# Patient Record
Sex: Male | Born: 1945
Health system: Southern US, Community
[De-identification: ages and names within clinical notes are randomized; demographics above are authoritative.]

## PROBLEM LIST (undated history)

## (undated) DIAGNOSIS — E785 Hyperlipidemia, unspecified: Secondary | ICD-10-CM

## (undated) DIAGNOSIS — M109 Gout, unspecified: Secondary | ICD-10-CM

## (undated) DIAGNOSIS — Z720 Tobacco use: Secondary | ICD-10-CM

## (undated) DIAGNOSIS — I10 Essential (primary) hypertension: Secondary | ICD-10-CM

## (undated) DIAGNOSIS — I4821 Permanent atrial fibrillation: Secondary | ICD-10-CM

## (undated) DIAGNOSIS — I5022 Chronic systolic (congestive) heart failure: Secondary | ICD-10-CM

## (undated) DIAGNOSIS — D649 Anemia, unspecified: Secondary | ICD-10-CM

## (undated) DIAGNOSIS — J449 Chronic obstructive pulmonary disease, unspecified: Secondary | ICD-10-CM

## (undated) DIAGNOSIS — J189 Pneumonia, unspecified organism: Secondary | ICD-10-CM

## (undated) DIAGNOSIS — E039 Hypothyroidism, unspecified: Secondary | ICD-10-CM

## (undated) DIAGNOSIS — J90 Pleural effusion, not elsewhere classified: Secondary | ICD-10-CM

## (undated) DIAGNOSIS — I447 Left bundle-branch block, unspecified: Secondary | ICD-10-CM

## (undated) HISTORY — DX: Pneumonia, unspecified organism: J18.9

## (undated) HISTORY — DX: Pleural effusion, not elsewhere classified: J90

## (undated) HISTORY — PX: SHOULDER SURGERY: SHX246

## (undated) HISTORY — DX: Hyperlipidemia, unspecified: E78.5

## (undated) HISTORY — PX: HEMORRHOID SURGERY: SHX153

## (undated) HISTORY — DX: Chronic systolic (congestive) heart failure: I50.22

## (undated) HISTORY — DX: Anemia, unspecified: D64.9

## (undated) HISTORY — DX: Essential (primary) hypertension: I10

## (undated) HISTORY — DX: Tobacco use: Z72.0

## (undated) HISTORY — DX: Permanent atrial fibrillation: I48.21

## (undated) HISTORY — DX: Left bundle-branch block, unspecified: I44.7

## (undated) HISTORY — DX: Chronic obstructive pulmonary disease, unspecified: J44.9

## (undated) HISTORY — DX: Hypothyroidism, unspecified: E03.9

## (undated) HISTORY — PX: TONSILLECTOMY: SUR1361

## (undated) HISTORY — DX: Gout, unspecified: M10.9

---

## 2008-04-26 ENCOUNTER — Emergency Department: Payer: Self-pay | Admitting: Emergency Medicine

## 2014-05-20 DIAGNOSIS — J189 Pneumonia, unspecified organism: Secondary | ICD-10-CM

## 2014-05-20 HISTORY — DX: Pneumonia, unspecified organism: J18.9

## 2014-06-29 ENCOUNTER — Inpatient Hospital Stay: Payer: Self-pay | Admitting: Internal Medicine

## 2014-06-29 ENCOUNTER — Other Ambulatory Visit: Payer: Self-pay | Admitting: Physician Assistant

## 2014-06-29 DIAGNOSIS — J189 Pneumonia, unspecified organism: Secondary | ICD-10-CM | POA: Diagnosis not present

## 2014-06-29 DIAGNOSIS — R739 Hyperglycemia, unspecified: Secondary | ICD-10-CM | POA: Diagnosis not present

## 2014-06-29 DIAGNOSIS — I4891 Unspecified atrial fibrillation: Secondary | ICD-10-CM | POA: Diagnosis not present

## 2014-06-29 DIAGNOSIS — I447 Left bundle-branch block, unspecified: Secondary | ICD-10-CM | POA: Diagnosis not present

## 2014-06-29 DIAGNOSIS — I340 Nonrheumatic mitral (valve) insufficiency: Secondary | ICD-10-CM | POA: Diagnosis not present

## 2014-06-30 DIAGNOSIS — I4891 Unspecified atrial fibrillation: Secondary | ICD-10-CM | POA: Diagnosis not present

## 2014-07-01 DIAGNOSIS — I4891 Unspecified atrial fibrillation: Secondary | ICD-10-CM | POA: Diagnosis not present

## 2014-07-02 DIAGNOSIS — I4891 Unspecified atrial fibrillation: Secondary | ICD-10-CM | POA: Diagnosis not present

## 2014-07-03 DIAGNOSIS — I4891 Unspecified atrial fibrillation: Secondary | ICD-10-CM | POA: Diagnosis not present

## 2014-07-04 ENCOUNTER — Telehealth: Payer: Self-pay

## 2014-07-04 DIAGNOSIS — I4891 Unspecified atrial fibrillation: Secondary | ICD-10-CM | POA: Diagnosis not present

## 2014-07-04 DIAGNOSIS — I5022 Chronic systolic (congestive) heart failure: Secondary | ICD-10-CM

## 2014-07-04 NOTE — Telephone Encounter (Signed)
Patient contacted regarding discharge from [facility] on 07/04/14. Spoke w/ pt's son.  Patient understands to follow up with Dr. Kirke Corin on 07/11/14 at 3:30 at [facility]. Patient understands discharge instructions? yes Patient understands medications and regiment? yes Patient understands to bring all medications to this visit? yes  Pt sched to come in for BMET on 2/18, per Eula Listen, PA.  Sondra Barges, PA-C  Marilynne Halsted, RN           Hey,   Can you schedule him for bmet on 2/18? He is being discharged from hospital today.   Dx: chronic systolic CHF

## 2014-07-07 ENCOUNTER — Other Ambulatory Visit (INDEPENDENT_AMBULATORY_CARE_PROVIDER_SITE_OTHER): Payer: Medicare HMO | Admitting: *Deleted

## 2014-07-07 DIAGNOSIS — I5022 Chronic systolic (congestive) heart failure: Secondary | ICD-10-CM | POA: Diagnosis not present

## 2014-07-08 LAB — BASIC METABOLIC PANEL
BUN / CREAT RATIO: 13 (ref 10–22)
BUN: 14 mg/dL (ref 8–27)
CO2: 35 mmol/L — ABNORMAL HIGH (ref 18–29)
CREATININE: 1.1 mg/dL (ref 0.76–1.27)
Calcium: 8.9 mg/dL (ref 8.6–10.2)
Chloride: 85 mmol/L — ABNORMAL LOW (ref 97–108)
GFR calc Af Amer: 79 mL/min/{1.73_m2} (ref >59)
GFR, EST NON AFRICAN AMERICAN: 68 mL/min/{1.73_m2} (ref >59)
Glucose: 101 mg/dL — ABNORMAL HIGH (ref 65–99)
POTASSIUM: 3 mmol/L — AB (ref 3.5–5.2)
Sodium: 137 mmol/L (ref 134–144)

## 2014-07-11 ENCOUNTER — Encounter: Payer: Self-pay | Admitting: Cardiovascular Disease

## 2014-07-11 ENCOUNTER — Encounter: Payer: Medicare HMO | Admitting: Cardiovascular Disease

## 2014-07-11 ENCOUNTER — Ambulatory Visit (INDEPENDENT_AMBULATORY_CARE_PROVIDER_SITE_OTHER): Payer: Medicare HMO | Admitting: Cardiovascular Disease

## 2014-07-11 ENCOUNTER — Telehealth: Payer: Self-pay | Admitting: Physician Assistant

## 2014-07-11 ENCOUNTER — Other Ambulatory Visit: Payer: Self-pay | Admitting: Physician Assistant

## 2014-07-11 ENCOUNTER — Telehealth: Payer: Self-pay | Admitting: *Deleted

## 2014-07-11 VITALS — BP 100/62 | HR 61 | Ht 70.0 in | Wt 158.0 lb

## 2014-07-11 DIAGNOSIS — I10 Essential (primary) hypertension: Secondary | ICD-10-CM

## 2014-07-11 DIAGNOSIS — I4891 Unspecified atrial fibrillation: Secondary | ICD-10-CM

## 2014-07-11 DIAGNOSIS — I5022 Chronic systolic (congestive) heart failure: Secondary | ICD-10-CM

## 2014-07-11 DIAGNOSIS — J441 Chronic obstructive pulmonary disease with (acute) exacerbation: Secondary | ICD-10-CM

## 2014-07-11 DIAGNOSIS — I420 Dilated cardiomyopathy: Secondary | ICD-10-CM | POA: Insufficient documentation

## 2014-07-11 MED ORDER — POTASSIUM CHLORIDE CRYS ER 20 MEQ PO TBCR: 20.0000 meq | EXTENDED_RELEASE_TABLET | Freq: Two times a day (BID) | ORAL | Status: DC

## 2014-07-11 MED ORDER — APIXABAN 5 MG PO TABS: 5.0000 mg | ORAL_TABLET | Freq: Two times a day (BID) | ORAL | Status: DC

## 2014-07-11 NOTE — Assessment & Plan Note (Signed)
Reports he is close to his baseline. Still on oxygen

## 2014-07-11 NOTE — Assessment & Plan Note (Signed)
Blood pressure is well controlled on today's visit. No changes made to the medications. 

## 2014-07-11 NOTE — Assessment & Plan Note (Signed)
We have encouraged him to bring in his medication list or call our office for accurate detailing in the computer. He feels that he is on his eliquis 5 mg twice a day. We will confirm this. Rate well controlled. He is interested in cardioversion. We'll meet again in 2 weeks for EKG to discuss pharmacologic cardioversion

## 2014-07-11 NOTE — Assessment & Plan Note (Signed)
Moderately depressed ejection fraction. Unclear if this is from arrhythmia. Will plan on ischemic workup in follow-up

## 2014-07-11 NOTE — Telephone Encounter (Signed)
-----   Message from Sondra Barges, PA-C sent at 07/08/2014 11:00 AM EST ----- Please call the patient. Patient needs to start KCl 20 mEq bid (he was discharged on Lasix 40 mg bid). Please have him load with 40 mEq x 1 today then start the above dosing

## 2014-07-11 NOTE — Telephone Encounter (Signed)
Contacted by patient stating that pharmacy refused to give him KCl. Contacted the patient's pharmacy, apparently pharmacist had a question regarding why he is taking KCl while already on spironolactone. I explained patient's K was 3.0 despite on spironolactone and authorized the pharmacy to give patient his Rx  Ramond Dial PA Pager: [phone]

## 2014-07-11 NOTE — Telephone Encounter (Signed)
Reviewed results with patient.  Patient verbalized understanding  

## 2014-07-11 NOTE — Patient Instructions (Signed)
You are doing well. No medication changes were made.  Please bring in your medications for review  Please call us if you have new issues that need to be addressed before your next appt.  Your physician wants you to follow-up in: 2 weeks with gollan and  for an ekg

## 2014-07-11 NOTE — Progress Notes (Signed)
Patient ID: Devin Ramsey, male    DOB: May 16, 1946, 69 y.o.   MRN: [id number]  HPI Comments: Devin Ramsey is a 69 year old gentleman who presented to the hospital for retained 2016 with bronchitis symptoms, EKG with new atrial fibrillation, chest x-ray showing pneumonia who was kept in the hospital for 5 days, COPD exacerbation. Cardiology was consult for management of his atrial fibrillation. He presents today to discuss establish care in the [place] office  In follow-up today, he reports that he feels much better, cough has dramatically improved. He was discharged on anticoagulation, eliquis. He did not bring his medication list with him. He has occasional cough but feels almost back to his baseline. He uses chronic oxygen at home Denies any tachycardia. During his hospital course he was treated with aggressive antibiotics  Lab work from the hospital showed sodium 132, potassium 5.1, hemoglobin A1c 5.3, negative cardiac enzymes, Total cholesterol 113, LDL 56, hematocrit 49, creatinine 1.02, magnesium 2.1  Chest x-ray with interstitial pulmonary edema, small bilateral pleural effusions  Echocardiogram reviewed with him from 06/29/2014 showing ejection fraction 35-40%, septal motion consistent with bundle branch block, normal right ventricular systolic pressure, normal left atrial size  EKG done on today's visit shows atrial fibrillation with left bundle branch block   Allergies  Allergen Reactions  . No Known Allergies     Outpatient Encounter Prescriptions as of 07/11/2014  Medication Sig  . CARTIA XT 120 MG 24 hr capsule Take 120 mg by mouth daily.   . furosemide (LASIX) 40 MG tablet Take 40 mg by mouth 2 (two) times daily.   Marland Kitchen lisinopril (PRINIVIL,ZESTRIL) 2.5 MG tablet Take 2.5 mg by mouth daily.   . metoprolol succinate (TOPROL-XL) 25 MG 24 hr tablet Take 25 mg by mouth daily.   Marland Kitchen Neomycin-Bacitracin-Polymyxin (HCA TRIPLE ANTIBIOTIC OINTMENT EX) as needed.   . NON FORMULARY  Equate Nicotine patch (OTC)  . OXYGEN 2 liters daily.  Marland Kitchen SPIRIVA HANDIHALER 18 MCG inhalation capsule   . spironolactone (ALDACTONE) 25 MG tablet Take 25 mg by mouth 2 (two) times daily.   Marland Kitchen apixaban (ELIQUIS) 5 MG TABS tablet Take 1 tablet (5 mg total) by mouth 2 (two) times daily.  . [DISCONTINUED] Vitamins A & D (VITAMIN A & D) 10000-400 UNITS CAPS     Past Medical History  Diagnosis Date  . Pneumonia 2016  . COPD (chronic obstructive pulmonary disease)   . A-fib   . Tobacco abuse   . Acute systolic CHF (congestive heart failure)     EF 35% to 40%  . Bilateral pleural effusion     Past Surgical History  Procedure Laterality Date  . Hemorrhoid surgery    . Shoulder surgery      right shoulder     Social History  reports that he quit smoking about 2 weeks ago. His smoking use included Cigarettes. He quit after 55 years of use. He does not have any smokeless tobacco history on file. He reports that he does not drink alcohol or use illicit drugs.  Family History He was adopted. Family history is unknown by patient.  Review of Systems  Constitutional: Negative.   Respiratory: Positive for cough and shortness of breath.   Cardiovascular: Negative.   Gastrointestinal: Negative.   Musculoskeletal: Negative.   Skin: Negative.   Neurological: Negative.   Hematological: Negative.   Psychiatric/Behavioral: Negative.   All other systems reviewed and are negative.   BP 100/62 mmHg  Pulse 61  Ht 5\' 10"  (  1.778 m)  Wt 158 lb (71.668 kg)  BMI 22.67 kg/m2  Physical Exam  Constitutional: He is oriented to person, place, and time. He appears well-developed and well-nourished.  HENT:  Head: Normocephalic.  Nose: Nose normal.  Mouth/Throat: Oropharynx is clear and moist.  Eyes: Conjunctivae are normal. Pupils are equal, round, and reactive to light.  Neck: Normal range of motion. Neck supple. No JVD present.  Cardiovascular: Normal rate, regular rhythm, S1 normal, S2 normal,  normal heart sounds and intact distal pulses.  Exam reveals no gallop and no friction rub.   No murmur heard. Pulmonary/Chest: Effort normal and breath sounds normal. No respiratory distress. He has no wheezes. He has no rales. He exhibits no tenderness.  Abdominal: Soft. Bowel sounds are normal. He exhibits no distension. There is no tenderness.  Musculoskeletal: Normal range of motion. He exhibits no edema or tenderness.  Lymphadenopathy:    He has no cervical adenopathy.  Neurological: He is alert and oriented to person, place, and time. Coordination normal.  Skin: Skin is warm and dry. No rash noted. No erythema.  Psychiatric: He has a normal mood and affect. His behavior is normal. Judgment and thought content normal.      Assessment and Plan   Nursing note and vitals reviewed.

## 2014-07-11 NOTE — Assessment & Plan Note (Signed)
Depressed ejection fraction in the hospital. Possibly from arrhythmia. Unable to exclude ischemia. We'll need to pursue ischemia workup in the near future. He is relatively asymptomatic at this time

## 2014-07-12 ENCOUNTER — Other Ambulatory Visit: Payer: Self-pay

## 2014-07-12 MED ORDER — ALBUTEROL SULFATE HFA 108 (90 BASE) MCG/ACT IN AERS: 2.0000 | INHALATION_SPRAY | Freq: Four times a day (QID) | RESPIRATORY_TRACT | Status: DC | PRN

## 2014-07-12 MED ORDER — PREDNISONE 10 MG PO TABS: 10.0000 mg | ORAL_TABLET | Freq: Every day | ORAL | Status: DC

## 2014-07-12 MED ORDER — LEVOFLOXACIN 500 MG PO TABS: 500.0000 mg | ORAL_TABLET | Freq: Every day | ORAL | Status: DC

## 2014-07-12 NOTE — Telephone Encounter (Signed)
See previous phone note.  

## 2014-07-12 NOTE — Telephone Encounter (Signed)
Yes, I know there is an interaction there. His K+ was still 3.0 even with being on spiro which is why we needed to add some KCl. We can recheck bmet thurs.

## 2014-07-12 NOTE — Telephone Encounter (Signed)
Attempted to call patient to schedule lab  No answer no vm

## 2014-07-13 ENCOUNTER — Telehealth: Payer: Self-pay | Admitting: Physician Assistant

## 2014-07-13 DIAGNOSIS — I4819 Other persistent atrial fibrillation: Secondary | ICD-10-CM

## 2014-07-13 NOTE — Telephone Encounter (Signed)
Spoke with patient. He will hold potassium tonight and in the morning until he gets his bmet on 2/25. He will come by the office around 9-930 for bmet.

## 2014-07-14 ENCOUNTER — Other Ambulatory Visit (INDEPENDENT_AMBULATORY_CARE_PROVIDER_SITE_OTHER): Payer: Medicare HMO | Admitting: *Deleted

## 2014-07-14 DIAGNOSIS — I5022 Chronic systolic (congestive) heart failure: Secondary | ICD-10-CM

## 2014-07-14 DIAGNOSIS — I4819 Other persistent atrial fibrillation: Secondary | ICD-10-CM

## 2014-07-14 NOTE — Telephone Encounter (Signed)
Patient cam e in for labs today  He said his "gout is acting up"  In the past he used Indocin  He wants to know if he can still take it and if so what dose?

## 2014-07-14 NOTE — Telephone Encounter (Signed)
Indocin is an NSAID which can increase the risk of bleeding now that he is on anticoagulation. Would advise that he see his PCP for alternative therapies. He could potentially take a short course of prednisone for this if his PCP sees fit.

## 2014-07-14 NOTE — Telephone Encounter (Signed)
Informed patient of Devin Dunns response  Patient verbalized understanding  

## 2014-07-15 ENCOUNTER — Telehealth: Payer: Self-pay | Admitting: *Deleted

## 2014-07-15 LAB — BASIC METABOLIC PANEL
BUN/Creatinine Ratio: 11 (ref 10–22)
BUN: 12 mg/dL (ref 8–27)
CO2: 23 mmol/L (ref 18–29)
Calcium: 8.9 mg/dL (ref 8.6–10.2)
Chloride: 91 mmol/L — ABNORMAL LOW (ref 97–108)
Creatinine, Ser: 1.11 mg/dL (ref 0.76–1.27)
GFR calc non Af Amer: 67 mL/min/{1.73_m2} (ref >59)
GFR, EST AFRICAN AMERICAN: 78 mL/min/{1.73_m2} (ref >59)
Glucose: 158 mg/dL — ABNORMAL HIGH (ref 65–99)
Potassium: 3.9 mmol/L (ref 3.5–5.2)
Sodium: 136 mmol/L (ref 134–144)

## 2014-07-15 NOTE — Telephone Encounter (Signed)
Reviewed results with patient. 

## 2014-07-15 NOTE — Telephone Encounter (Signed)
-----   Message from Sondra Barges, PA-C sent at 07/15/2014 12:41 PM EST ----- Please call the patient. K+ is normal Continue Aldactone Hold KCl at this time

## 2014-07-28 ENCOUNTER — Encounter: Payer: Self-pay | Admitting: Cardiovascular Disease

## 2014-07-28 ENCOUNTER — Ambulatory Visit (INDEPENDENT_AMBULATORY_CARE_PROVIDER_SITE_OTHER): Payer: Medicare HMO | Admitting: Cardiovascular Disease

## 2014-07-28 VITALS — BP 114/62 | HR 68 | Ht 70.0 in | Wt 162.5 lb

## 2014-07-28 DIAGNOSIS — J441 Chronic obstructive pulmonary disease with (acute) exacerbation: Secondary | ICD-10-CM

## 2014-07-28 DIAGNOSIS — I420 Dilated cardiomyopathy: Secondary | ICD-10-CM

## 2014-07-28 DIAGNOSIS — I5022 Chronic systolic (congestive) heart failure: Secondary | ICD-10-CM

## 2014-07-28 DIAGNOSIS — I10 Essential (primary) hypertension: Secondary | ICD-10-CM

## 2014-07-28 DIAGNOSIS — I4891 Unspecified atrial fibrillation: Secondary | ICD-10-CM

## 2014-07-28 MED ORDER — AMIODARONE HCL 200 MG PO TABS: 200.0000 mg | ORAL_TABLET | Freq: Two times a day (BID) | ORAL | Status: DC

## 2014-07-28 NOTE — Progress Notes (Signed)
Patient ID: Devin Ramsey, male    DOB: 21-Apr-1946, 69 y.o.   MRN: [id number]  HPI Comments: Mr. Devin Ramsey is a 69 year old gentleman who presented to the hospital for retained 2016 with bronchitis symptoms, EKG with new atrial fibrillation, chest x-ray showing pneumonia who was kept in the hospital for 5 days, COPD exacerbation. Cardiology was consult for management of his atrial fibrillation. He follows up today to discuss atrial fibrillation management Previous echocardiogram showed ejection fraction 35-40%, February 2016 in the setting of atrial fibrillation  EKG on today's visit shows atrial fibrillation, left bundle branch block  He reports that he feels better, denies having significant chest pain. Some chronic mild shortness of breath. Uncertain if this is from COPD or atrial fibrillation. Uses oxygen at home He has been tolerating anticoagulation well with no side effects Bronchitis symptoms have resolved  Other past medical history Lab work from the hospital showed sodium 132, potassium 5.1, hemoglobin A1c 5.3, negative cardiac enzymes, Total cholesterol 113, LDL 56, hematocrit 49, creatinine 1.02, magnesium 2.1  Chest x-ray with interstitial pulmonary edema, small bilateral pleural effusions  Echocardiogram reviewed with him from 06/29/2014 showing ejection fraction 35-40%, septal motion consistent with bundle branch block, normal right ventricular systolic pressure, normal left atrial size   Allergies  Allergen Reactions  . No Known Allergies     Outpatient Encounter Prescriptions as of 07/28/2014  Medication Sig  . albuterol (PROVENTIL HFA;VENTOLIN HFA) 108 (90 BASE) MCG/ACT inhaler Inhale 2 puffs into the lungs every 6 (six) hours as needed for wheezing or shortness of breath.  Marland Kitchen apixaban (ELIQUIS) 5 MG TABS tablet Take 1 tablet (5 mg total) by mouth 2 (two) times daily.  Marland Kitchen CARTIA XT 120 MG 24 hr capsule Take 120 mg by mouth daily.   . furosemide (LASIX) 40 MG tablet Take  40 mg by mouth 2 (two) times daily.   Marland Kitchen lisinopril (PRINIVIL,ZESTRIL) 2.5 MG tablet Take 2.5 mg by mouth daily.   . metoprolol succinate (TOPROL-XL) 25 MG 24 hr tablet Take 25 mg by mouth daily.   Marland Kitchen Neomycin-Bacitracin-Polymyxin (HCA TRIPLE ANTIBIOTIC OINTMENT EX) as needed.   . NON FORMULARY Equate Nicotine patch (OTC)  . OXYGEN 2 liters daily.  Marland Kitchen SPIRIVA HANDIHALER 18 MCG inhalation capsule   . spironolactone (ALDACTONE) 25 MG tablet Take 25 mg by mouth 2 (two) times daily.   Marland Kitchen amiodarone (PACERONE) 200 MG tablet Take 1 tablet (200 mg total) by mouth 2 (two) times daily.  . [DISCONTINUED] levofloxacin (LEVAQUIN) 500 MG tablet Take 1 tablet (500 mg total) by mouth daily. (Patient not taking: Reported on 07/28/2014)  . [DISCONTINUED] predniSONE (DELTASONE) 10 MG tablet Take 1 tablet (10 mg total) by mouth daily with breakfast. (Patient not taking: Reported on 07/28/2014)    Past Medical History  Diagnosis Date  . Pneumonia 2016  . COPD (chronic obstructive pulmonary disease)   . A-fib   . Tobacco abuse   . Acute systolic CHF (congestive heart failure)     EF 35% to 40%  . Bilateral pleural effusion     Past Surgical History  Procedure Laterality Date  . Hemorrhoid surgery    . Shoulder surgery      right shoulder     Social History  reports that he quit smoking about 4 weeks ago. His smoking use included Cigarettes. He quit after 55 years of use. He does not have any smokeless tobacco history on file. He reports that he does not drink alcohol or use  illicit drugs.  Family History He was adopted. Family history is unknown by patient.  Review of Systems  Constitutional: Negative.   Respiratory: Positive for shortness of breath.   Cardiovascular: Negative.   Gastrointestinal: Negative.   Musculoskeletal: Negative.   Skin: Negative.   Neurological: Negative.   Hematological: Negative.   Psychiatric/Behavioral: Negative.   All other systems reviewed and are negative.   BP  114/62 mmHg  Pulse 68  Ht 5\' 10"  (1.778 m)  Wt 162 lb 8 oz (73.71 kg)  BMI 23.32 kg/m2  Physical Exam  Constitutional: He is oriented to person, place, and time. He appears well-developed and well-nourished.  HENT:  Head: Normocephalic.  Nose: Nose normal.  Mouth/Throat: Oropharynx is clear and moist.  Eyes: Conjunctivae are normal. Pupils are equal, round, and reactive to light.  Neck: Normal range of motion. Neck supple. No JVD present.  Cardiovascular: Normal rate, regular rhythm, S1 normal, S2 normal, normal heart sounds and intact distal pulses.  Exam reveals no gallop and no friction rub.   No murmur heard. Pulmonary/Chest: Effort normal and breath sounds normal. No respiratory distress. He has no wheezes. He has no rales. He exhibits no tenderness.  Abdominal: Soft. Bowel sounds are normal. He exhibits no distension. There is no tenderness.  Musculoskeletal: Normal range of motion. He exhibits no edema or tenderness.  Lymphadenopathy:    He has no cervical adenopathy.  Neurological: He is alert and oriented to person, place, and time. Coordination normal.  Skin: Skin is warm and dry. No rash noted. No erythema.  Psychiatric: He has a normal mood and affect. His behavior is normal. Judgment and thought content normal.      Assessment and Plan   Nursing note and vitals reviewed.

## 2014-07-28 NOTE — Assessment & Plan Note (Signed)
Blood pressure is well controlled on today's visit. No changes made to the medications. 

## 2014-07-28 NOTE — Assessment & Plan Note (Signed)
Heart rate well controlled. Discussed various treatment options for his atrial fibrillation. He is interested in cardioversion. We will start amiodarone in an attempt to cardiovert, 400 mg by mouth twice a day for 5 days then 200 mg twice a day with EKG in 2 weeks. If still in atrial fibrillation, will schedule cardioversion

## 2014-07-28 NOTE — Assessment & Plan Note (Signed)
Recent COPD exacerbation has resolved. High risk of recurrent bronchitis and arrhythmia

## 2014-07-28 NOTE — Assessment & Plan Note (Signed)
We will try to restore normal sinus rhythm, reevaluate his ejection fraction, If still depressed, will need ischemia workup High risk of coronary disease given long smoking history. This was discussed with him

## 2014-07-28 NOTE — Patient Instructions (Signed)
You are doing well. You are still in atrial fibrillation.  Please start amiodarone 400 mg twice a day for 5 days Then on Wednesday, decrease the dose down to 200 mg twice a day EKG in 2 weeks If still in atrial fibrillation, We will schedule a cardioversion  Please call us if you have new issues that need to be addressed before your next appt.  Your physician wants you to follow-up in: 1 month.

## 2014-07-28 NOTE — Assessment & Plan Note (Signed)
Appears relatively euvolemic on today's visit. Shortness of breath close to his baseline

## 2014-08-03 ENCOUNTER — Other Ambulatory Visit: Payer: Self-pay | Admitting: *Deleted

## 2014-08-03 ENCOUNTER — Telehealth: Payer: Self-pay | Admitting: Cardiovascular Disease

## 2014-08-03 MED ORDER — APIXABAN 5 MG PO TABS: 5.0000 mg | ORAL_TABLET | Freq: Two times a day (BID) | ORAL | Status: DC

## 2014-08-03 NOTE — Telephone Encounter (Signed)
Patient stopped by the office to give cell phone.  Patient has no refills of Eliquis 5mg  2x daily and wants to know if he needs refills.

## 2014-08-11 ENCOUNTER — Ambulatory Visit (INDEPENDENT_AMBULATORY_CARE_PROVIDER_SITE_OTHER): Payer: Medicare HMO

## 2014-08-11 VITALS — BP 118/70 | HR 65 | Ht 70.0 in | Wt 167.5 lb

## 2014-08-11 DIAGNOSIS — I4892 Unspecified atrial flutter: Secondary | ICD-10-CM | POA: Diagnosis not present

## 2014-08-11 NOTE — Progress Notes (Signed)
1.) Reason for visit: EKG  2.) Name of MD requesting visit: Dr. 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  Pt provided w/ work note.  He is agreeable to proceeding with cardioversion if necessary.   5.) Assessment and plan per MD:  Advised pt that I will call him back w/ Dr. Windell Hummingbird recommendation.

## 2014-08-11 NOTE — Patient Instructions (Signed)
Dr. Gollan will review your EKG I will call you with his recommendation 

## 2014-08-15 ENCOUNTER — Telehealth: Payer: Self-pay | Admitting: Cardiovascular Disease

## 2014-08-15 DIAGNOSIS — I4891 Unspecified atrial fibrillation: Secondary | ICD-10-CM

## 2014-08-15 DIAGNOSIS — Z01812 Encounter for preprocedural laboratory examination: Secondary | ICD-10-CM

## 2014-08-15 NOTE — Telephone Encounter (Signed)
EKG in the office on nurse visit recently showed continued atrial fibrillation Would schedule a cardioversion if he is interested  Would try not to stop amiodarone until after the cardioversion Would continue on eliquis indefinitely for now  He will need to be very careful about indomethacin/Indocin use while on blood thinners. We'll try to avoid. May need to use other gout medications. He should talk to his primary care physician.  Unclear whether he needs oxygen as can only tell if he is dropping his levels with a oximeter.

## 2014-08-15 NOTE — Telephone Encounter (Signed)
Patient thinks he may be having reaction to amiodarone bc he has been itching for 2 weeks.  Patient states he is itching everywhere.     Patient also wants to know if he can take gout medicine  Indocin in combination with Amiodarone and Eliquis.   Patient has not used oxygen in over a week and states he does not have a need or desire to use it.  Is it ok to stop using this at this time.     Please call patient.

## 2014-08-16 NOTE — Telephone Encounter (Signed)
Spoke w/ pt.  Advised him of Dr. Windell Hummingbird recommendation.  He is agreeable to continuing amio until his cardioversion, which is shed for 08/23/14 @ 7:30. Pt to come in for labs 08/18/14 and go over instructions.

## 2014-08-18 ENCOUNTER — Other Ambulatory Visit (INDEPENDENT_AMBULATORY_CARE_PROVIDER_SITE_OTHER): Payer: Medicare HMO | Admitting: *Deleted

## 2014-08-18 DIAGNOSIS — I4891 Unspecified atrial fibrillation: Secondary | ICD-10-CM

## 2014-08-18 DIAGNOSIS — Z01812 Encounter for preprocedural laboratory examination: Secondary | ICD-10-CM

## 2014-08-19 LAB — CBC WITH DIFFERENTIAL/PLATELET
BASOS: 1 %
Basophils Absolute: 0 10*3/uL (ref 0.0–0.2)
EOS: 3 %
Eosinophils Absolute: 0.3 10*3/uL (ref 0.0–0.4)
HCT: 40.6 % (ref 37.5–51.0)
Hemoglobin: 13.7 g/dL (ref 12.6–17.7)
Immature Grans (Abs): 0 10*3/uL (ref 0.0–0.1)
Immature Granulocytes: 0 %
LYMPHS ABS: 2.5 10*3/uL (ref 0.7–3.1)
Lymphs: 30 %
MCH: 33.3 pg — ABNORMAL HIGH (ref 26.6–33.0)
MCHC: 33.7 g/dL (ref 31.5–35.7)
MCV: 99 fL — AB (ref 79–97)
MONOS ABS: 0.5 10*3/uL (ref 0.1–0.9)
Monocytes: 6 %
Neutrophils Absolute: 5.1 10*3/uL (ref 1.4–7.0)
Neutrophils Relative %: 60 %
Platelets: 165 10*3/uL (ref 150–379)
RBC: 4.11 x10E6/uL — ABNORMAL LOW (ref 4.14–5.80)
RDW: 13.4 % (ref 12.3–15.4)
WBC: 8.3 10*3/uL (ref 3.4–10.8)

## 2014-08-19 LAB — BASIC METABOLIC PANEL
BUN/Creatinine Ratio: 10 (ref 10–22)
BUN: 10 mg/dL (ref 8–27)
CALCIUM: 8.5 mg/dL — AB (ref 8.6–10.2)
CO2: 22 mmol/L (ref 18–29)
Chloride: 102 mmol/L (ref 97–108)
Creatinine, Ser: 1.04 mg/dL (ref 0.76–1.27)
GFR calc Af Amer: 84 mL/min/{1.73_m2} (ref >59)
GFR, EST NON AFRICAN AMERICAN: 73 mL/min/{1.73_m2} (ref >59)
GLUCOSE: 110 mg/dL — AB (ref 65–99)
POTASSIUM: 4 mmol/L (ref 3.5–5.2)
SODIUM: 139 mmol/L (ref 134–144)

## 2014-08-19 LAB — PROTIME-INR
INR: 1 (ref 0.8–1.2)
PROTHROMBIN TIME: 10.5 s (ref 9.1–12.0)

## 2014-08-22 NOTE — Telephone Encounter (Signed)
Patient walked in to office to confirm cardioversion schedule.  Patient continues to complain of itching and says he has continued taking his amiodarone but instead of taking 2 as directed he now only takes 1.

## 2014-08-23 ENCOUNTER — Ambulatory Visit
Admit: 2014-08-23 | Disposition: A | Discharge status: Home / Self Care | Payer: Self-pay | Attending: Cardiovascular Disease | Admitting: Cardiovascular Disease

## 2014-08-23 DIAGNOSIS — I4892 Unspecified atrial flutter: Secondary | ICD-10-CM | POA: Diagnosis not present

## 2014-08-26 ENCOUNTER — Encounter: Payer: Self-pay | Admitting: Cardiovascular Disease

## 2014-08-26 ENCOUNTER — Ambulatory Visit (INDEPENDENT_AMBULATORY_CARE_PROVIDER_SITE_OTHER): Payer: Medicare HMO | Admitting: Cardiovascular Disease

## 2014-08-26 VITALS — BP 128/62 | HR 80 | Ht 70.0 in | Wt 177.5 lb

## 2014-08-26 DIAGNOSIS — J441 Chronic obstructive pulmonary disease with (acute) exacerbation: Secondary | ICD-10-CM | POA: Diagnosis not present

## 2014-08-26 DIAGNOSIS — I4891 Unspecified atrial fibrillation: Secondary | ICD-10-CM | POA: Diagnosis not present

## 2014-08-26 DIAGNOSIS — I5022 Chronic systolic (congestive) heart failure: Secondary | ICD-10-CM | POA: Diagnosis not present

## 2014-08-26 DIAGNOSIS — I420 Dilated cardiomyopathy: Secondary | ICD-10-CM

## 2014-08-26 DIAGNOSIS — I10 Essential (primary) hypertension: Secondary | ICD-10-CM | POA: Diagnosis not present

## 2014-08-26 MED ORDER — APIXABAN 5 MG PO TABS: 5.0000 mg | ORAL_TABLET | Freq: Two times a day (BID) | ORAL | Status: DC

## 2014-08-26 MED ORDER — AMIODARONE HCL 200 MG PO TABS: 200.0000 mg | ORAL_TABLET | Freq: Every day | ORAL | Status: DC

## 2014-08-26 MED ORDER — CARTIA XT 120 MG PO CP24: 120.0000 mg | ORAL_CAPSULE | Freq: Every day | ORAL | Status: DC

## 2014-08-26 NOTE — Assessment & Plan Note (Signed)
We will discuss with him in follow-up repeat echocardiogram to reevaluate ejection fraction now that he is in normal sinus rhythm

## 2014-08-26 NOTE — Assessment & Plan Note (Signed)
Maintaining normal sinus rhythm. Recommended that he stay on amiodarone 200 mg daily, eliquis 5 mill grams twice a day, Restart diltiazem 120 mg daily. We will avoid beta blockers given his COPD and risk of bronchospasm

## 2014-08-26 NOTE — Assessment & Plan Note (Signed)
We'll restart Lasix to be taken as needed with potassium for any weight gain Suspect he will need Lasix several days per week.  We discussed his fluid intake, monitoring his weight on a daily basis Suggested he take Lasix for 2-3 pound weight gain with potassium

## 2014-08-26 NOTE — Progress Notes (Signed)
Patient ID: CLEMENS LACHMAN, male    DOB: 27-Jun-1945, 69 y.o.   MRN: [id number]  HPI Comments: Mr. Wehmeyer is a 69 year old gentleman who presented to the hospital 06/2014 with bronchitis symptoms, EKG with new atrial fibrillation, chest x-ray showing pneumonia who was kept in the hospital for 5 days, COPD exacerbation. He had cardioversion 08/23/2014 for A. fib flutter, He follows up today to discuss atrial fibrillation management Previous echocardiogram showed ejection fraction 35-40%, February 2016 in the setting of atrial fibrillation  In follow-up today, he reports that he is feeling better. Less shortness of breath, less palpitations. He has stopped many of his medications. Unclear why. He is taking amiodarone 200 mg daily, eliquis 5 mg twice a day. He is no longer taking metoprolol, diltiazem, lisinopril, Lasix, spironolactone He does have some lower extremity edema but reports this is because he was on his feet all day. Swelling resolves in the morning. He reports that he has an inhaler but he is uncertain of the name. Unclear if it is albuterol or Spiriva. No more bronchitis symptoms  EKG on today's visit shows normal sinus rhythm with left bundle branch block, APCs   Other past medical history Lab work from the hospital showed sodium 132, potassium 5.1, hemoglobin A1c 5.3, negative cardiac enzymes, Total cholesterol 113, LDL 56, hematocrit 49, creatinine 1.02, magnesium 2.1  Chest x-ray with interstitial pulmonary edema, small bilateral pleural effusions  Echocardiogram reviewed with him from 06/29/2014 showing ejection fraction 35-40%, septal motion consistent with bundle branch block, normal right ventricular systolic pressure, normal left atrial size   Allergies  Allergen Reactions  . No Known Allergies     Outpatient Encounter Prescriptions as of 08/26/2014  Medication Sig  . albuterol (PROVENTIL HFA;VENTOLIN HFA) 108 (90 BASE) MCG/ACT inhaler Inhale 2 puffs into the lungs  every 6 (six) hours as needed for wheezing or shortness of breath.  Marland Kitchen amiodarone (PACERONE) 200 MG tablet Take 1 tablet (200 mg total) by mouth daily.  Marland Kitchen apixaban (ELIQUIS) 5 MG TABS tablet Take 1 tablet (5 mg total) by mouth 2 (two) times daily.  Marland Kitchen CARTIA XT 120 MG 24 hr capsule Take 1 capsule (120 mg total) by mouth daily.  . furosemide (LASIX) 40 MG tablet Take 40 mg by mouth 2 (two) times daily.   Marland Kitchen Neomycin-Bacitracin-Polymyxin (HCA TRIPLE ANTIBIOTIC OINTMENT EX) as needed.   . NON FORMULARY Equate Nicotine patch (OTC)  . OXYGEN 2 liters daily.  Marland Kitchen SPIRIVA HANDIHALER 18 MCG inhalation capsule   . [DISCONTINUED] amiodarone (PACERONE) 200 MG tablet Take 1 tablet (200 mg total) by mouth 2 (two) times daily. (Patient taking differently: Take 200 mg by mouth daily. )  . [DISCONTINUED] apixaban (ELIQUIS) 5 MG TABS tablet Take 1 tablet (5 mg total) by mouth 2 (two) times daily.  . [DISCONTINUED] CARTIA XT 120 MG 24 hr capsule Take 120 mg by mouth daily.   . [DISCONTINUED] lisinopril (PRINIVIL,ZESTRIL) 2.5 MG tablet Take 2.5 mg by mouth daily.   . [DISCONTINUED] metoprolol succinate (TOPROL-XL) 25 MG 24 hr tablet Take 25 mg by mouth daily.   . [DISCONTINUED] spironolactone (ALDACTONE) 25 MG tablet Take 25 mg by mouth 2 (two) times daily.     Past Medical History  Diagnosis Date  . Pneumonia 2016  . COPD (chronic obstructive pulmonary disease)   . A-fib   . Tobacco abuse   . Acute systolic CHF (congestive heart failure)     EF 35% to 40%  . Bilateral pleural effusion  Past Surgical History  Procedure Laterality Date  . Hemorrhoid surgery    . Shoulder surgery      right shoulder     Social History  reports that he quit smoking about 1 months ago. His smoking use included Cigarettes. He quit after 55 years of use. He does not have any smokeless tobacco history on file. He reports that he does not drink alcohol or use illicit drugs.  Family History He was adopted. Family history is  unknown by patient.  Review of Systems  Constitutional: Negative.   Respiratory: Negative.   Cardiovascular: Negative.   Gastrointestinal: Negative.   Musculoskeletal: Negative.   Skin: Negative.   Neurological: Negative.   Hematological: Negative.   Psychiatric/Behavioral: Negative.   All other systems reviewed and are negative.   BP 128/62 mmHg  Pulse 80  Ht 5\' 10"  (1.778 m)  Wt 177 lb 8 oz (80.513 kg)  BMI 25.47 kg/m2  Physical Exam  Constitutional: He is oriented to person, place, and time. He appears well-developed and well-nourished.  HENT:  Head: Normocephalic.  Nose: Nose normal.  Mouth/Throat: Oropharynx is clear and moist.  Eyes: Conjunctivae are normal. Pupils are equal, round, and reactive to light.  Neck: Normal range of motion. Neck supple. No JVD present.  Cardiovascular: Normal rate, regular rhythm, S1 normal, S2 normal, normal heart sounds and intact distal pulses.  Exam reveals no gallop and no friction rub.   No murmur heard. Pulmonary/Chest: Effort normal and breath sounds normal. No respiratory distress. He has no wheezes. He has no rales. He exhibits no tenderness.  Abdominal: Soft. Bowel sounds are normal. He exhibits no distension. There is no tenderness.  Musculoskeletal: Normal range of motion. He exhibits no edema or tenderness.  Lymphadenopathy:    He has no cervical adenopathy.  Neurological: He is alert and oriented to person, place, and time. Coordination normal.  Skin: Skin is warm and dry. No rash noted. No erythema.  Psychiatric: He has a normal mood and affect. His behavior is normal. Judgment and thought content normal.      Assessment and Plan   Nursing note and vitals reviewed.

## 2014-08-26 NOTE — Patient Instructions (Addendum)
You are doing well.  Check your weight at home Take a lasix in the Am with potassium for 2 to 3 pound weight gain Keep the weight steady  Start diltiazem one a day Stay on eliquis twice as day, and amiodarone one a day  Please call us if you have new issues that need to be addressed before your next appt.  Your physician wants you to follow-up in: 6 months.  You will receive a reminder letter in the mail two months in advance. If you don't receive a letter, please call our office to schedule the follow-up appointment.

## 2014-08-26 NOTE — Assessment & Plan Note (Signed)
Blood pressure well controlled. We will add diltiazem for rhythm control

## 2014-08-26 NOTE — Assessment & Plan Note (Signed)
Pulmonary status has improved with resolving bronchitis

## 2014-08-29 ENCOUNTER — Other Ambulatory Visit: Payer: Self-pay | Admitting: *Deleted

## 2014-08-29 MED ORDER — FUROSEMIDE 40 MG PO TABS: 40.0000 mg | ORAL_TABLET | Freq: Two times a day (BID) | ORAL | Status: DC

## 2014-09-15 ENCOUNTER — Telehealth: Payer: Self-pay | Admitting: Cardiovascular Disease

## 2014-09-15 NOTE — Telephone Encounter (Signed)
For his anticoagulation He is a fall risk Would benefit from switching to Pradaxa with reversing agent, May benefit for lower tier  Would leave it up to him concerning the oxygen. This was started by the hospital Sometimes people sleep with this at nighttime and have improved sleep, less stress on the heart

## 2014-09-15 NOTE — Telephone Encounter (Signed)
Pt would like to know if there is a more affordable option than Eliquis. He still has samples, but understands that this is not a permanent option.  He would like to know if he should continue O2, as he is not using it.

## 2014-09-15 NOTE — Telephone Encounter (Signed)
Patient wants to know if he can switch to Generic eliquis patient cannot afford to afford to refill.  Patient has several does left just planning ahead.    Should we switch to generic or continue to do samples.     Also, Patient has not used 02 machine in 6 weeks and wants to send back is this ok.  Patient does not want to continue paying to keep something he doesn't need.

## 2014-09-16 MED ORDER — DABIGATRAN ETEXILATE MESYLATE 150 MG PO CAPS: 150.0000 mg | ORAL_CAPSULE | Freq: Two times a day (BID) | ORAL | Status: DC

## 2014-09-16 NOTE — Telephone Encounter (Addendum)
Spoke w/ pt.  Advised him of Dr. Windell Hummingbird recommendation.  He is agreeable and reports that he has about 100 Eliquis pills that he would like to use up before switching to Pradaxa.  Advised him that I will send in rxn for Pradaxa 150 mg and paperwork for tier reduction to [facility].

## 2014-09-18 NOTE — H&P (Signed)
PATIENT NAME:  Devin Ramsey, Devin Ramsey MR#:  [id number] DATE OF BIRTH:  1945-07-01  DATE OF ADMISSION:  06/29/2014  PRIMARY CARE PHYSICIAN: None.   REFERRING EMERGENCY ROOM PHYSICIAN:  Gladstone Pih, M.D.   CHIEF COMPLAINT: Shortness of breath.   HISTORY OF PRESENT ILLNESS: A 69 year old male who does not go to any doctor because he is not doing any regular job for the last few years. 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 Denies having any long-term medical issues when he was going to a doctor, which was more than a couple of years ago. States that he smokes 2 packs of cigarettes every day for almost 50 years now. For the last 2 days, he started having shortness of breath and some cough, more than his usual, with slight clear sputum production and some wheezing. Denies any fever or chills. Denies any chest pain or palpitations, so finally decided to come to the Emergency Room by calling the EMS. He was also noted having tachycardia in the Emergency Room with atrial fibrillation. Given Cardizem injection, which helped to slow down his heart rate, but he is still in atrial fibrillation. On initial evaluation, he was also found having pneumonia on chest x-ray, and elevated hematocrit, mostly as a response of his smoking and possible COPD, so given to the hospitalist team for further management.   REVIEW OF SYSTEMS:    CONSTITUTIONAL: Negative for fever, fatigue, weakness, pain or weight loss.  EYES: No blurring of vision, discharge or redness.  ENT: No tinnitus, ear pain, or hearing loss.  RESPIRATORY: The patient has some cough and some sputum production, mild wheezing. No chest pain.  CARDIOVASCULAR: No chest pain, orthopnea, edema, arrhythmia or palpitations.  GASTROINTESTINAL: No nausea, vomiting, diarrhea, abdominal pain.  GENITOURINARY: No dysuria, hematuria, or increased frequency.  ENDOCRINE: No heat or cold intolerance. No  excessive sweating.  SKIN: No acne, rashes, or lesions.  MUSCULOSKELETAL: No pain or swelling in the joints.  NEUROLOGICAL: No numbness, weakness, tremor or vertigo.  PSYCHIATRIC: No anxiety, insomnia, or bipolar disorder.   PAST MEDICAL HISTORY:  Gout.   PAST SURGICAL HISTORY: Hemorrhoid surgery twice.   SOCIAL HISTORY: A smoker, 2 packs of cigarettes every day for 50 years, drinks beers now and then, but not alcoholic. Denies doing any illegal drug use. 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   FAMILY HISTORY: Is not known as he says he was adopted and does know any of his real family members.   HOME MEDICATIONS:  None.   PHYSICAL EXAMINATION:  VITAL SIGNS: In the ER, temperature 98.5, pulse is 120, which came down to 90 after injection of Cardizem and oral Cardizem. Respirations are 22 to 24, blood pressure 143/83, and pulse oximetry is 92 with 2-liter oxygen supplementation.  GENERAL: The patient is fully alert and oriented to time, place, and person. Does not appear in any acute distress.  HEENT: Head and neck atraumatic. Conjunctivae are pink. Oral mucosa moist.  NECK: Supple. No JVD. Thyroid nontender.  RESPIRATORY: Bilateral equal air entry. There is some wheezing present on the right lung. No crepitation heard.  CARDIOVASCULAR: S1, S2 present, irregular. No murmur. There is no tenderness on local palpation on the chest.  ABDOMEN: Soft, nontender. Bowel sounds present. No organomegaly.  SKIN: No acne, rashes, or lesions.  MUSCULOSKELETAL: No tenderness or swelling in  the joints.  LEGS: No edema.  NEUROLOGICAL: Power 5/5. Moves all 4 limbs. No tremor or rigidity. Sensation is intact.  PSYCHIATRIC: Does not appear in any acute psychiatric illness at this time.   IMPORTANT LABORATORY RESULTS:  Glucose 135, BUN 5, creatinine 0.99, sodium 137, potassium 4.0, chloride 101, CO2 of 24, calcium level 8.7. Total  protein 7, albumin 3.8, bilirubin 1.1, alkaline phosphate 143, SGOT 25, SGPT 26.   Troponin less than 0.02.   WBC 7.2, hemoglobin 18.3, platelet count 155,000, MCV is 107.   Chest x-ray, portable, shows increased density within the medial right lung base consistent with acute infiltrate.   ASSESSMENT AND PLAN: A 69 year old male without any known past medical history, came to the Emergency Room with shortness of breath, found having pneumonia, atrial fibrillation and rapid ventricular response.   1. Pneumonia. We will treat him with Rocephin and azithromycin and try to get a sputum culture and blood cultures.  2. Chronic obstructive pulmonary disease exacerbation. The patient is a smoker for 50 years and there is some wheezing on local examination of the chest, so we will start him on oral steroid and nebulizer therapy and treat his pneumonia.  3. Atrial fibrillation with rapid ventricular response. Currently slowed down after giving injection of Cardizem, but his rhythm is still in atrial fibrillation. His EKG shows left bundle branch block, but we do not know since how long he has that, as he was not following with a doctor. Denies any chest pain. His troponin is negative, so we will admit to telemetry, get serial troponins, and get an echocardiogram and cardiology consult for further management of this issue.  4. Smoking. Smoking cessation counseling was done for 4 minutes and offered him a nicotine patch. He agreed to quit smoking, but does not want to have any supplement right now. He will let the nurse know if he requires.  5. Elevated hemoglobin and hematocrit. This may be a response to his chronic smoking. Currently, the patient is asymptomatic. We will just continue monitoring and I would advise him to follow his hemoglobin as outpatient with any primary care doctor within the next 1 month. If it does not come down, then he needs to start seeing a hematology doctor also in the office.   CODE  STATUS: FULL CODE.   TOTAL TIME SPENT: 50 minutes.    ____________________________ Hope Pigeon Elisabeth Pigeon, MD vgv:[id number] D: 06/29/2014 09:25:38 ET T: 06/29/2014 10:17:09 ET JOB#: [id number]  cc: Hope Pigeon. Elisabeth Pigeon, MD, <Dictator> Altamese Dilling MD ELECTRONICALLY SIGNED 07/06/2014 16:12

## 2014-09-18 NOTE — Consult Note (Signed)
General Aspect Primary Cardiologist: New to [facility] ______________  69 year old male with history of long standing tobacco abuse (110 pack years), long standing alcohol abuse (up to 1 case per day), and chronic low back and knee pain who presented to [facility] today with a 2 day history of increased SOB, he was found to be in new onset a-fib with RVR heart rates in the 120s and RML PNA. _____________  PMH: 1. long standing tobacco abuse (110 pack years) 2. long standing alcohol abuse (up to 1 case per day) 3. chronic low back and knee pain ______________   Present Illness 69 year old male with the above problem list who presented to [facility] on 2/10 with a 2 day history of increased SOB, he was found to be in new onset a-fib with RVR heart rates in the 120s and RML PNA. He denies any previously known cardiac history and has never seen a cardiologist before. 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 He has since retired and no longer sees a PCP. He reports not being very active at home and not eating a very healthy diet. He has smoked tobacco for 55 years, mostly at 2 packs daily. He continues to smoke tobacco currently. He also has a long standing history of alcohol abuse, currently drinking 8-10 tweleve oz beers on most days, but has previously drank a case of beer daily.  He has smoked THC years ago, but denies all other illicit drugs.   He has been sick with an upper respiratory illness for the past 1 week leading to increased congestion and cough. He did not seek medical evaluation for this. Over the past 2 days he began to have progrssive worsening of SOB. On the night of 2/8 he was having a difficult time laying flat while trying to sleep, but was ultimately able to fall asleep. On 2/9 his SOB was significantly worse preventing him from falling asleep. He tried sitting up in a chair, however that did nto help. He did not add any extra pillows. This  prompted him to come to [facility] for evaluation. He reports his weight is down by about 20 pounds from his last DOT PE. He denies any lower extremity edema or abdominal fulness. No early satiety. No chest pain throughout any of the above.   Upon his arrival to [facility] he was found to be in new onset a-fib with RVR, heart rate 119. He received IV diltiazem 20 x 1 and received diltiazem 60 mg po x 1. He remains in a-fib with HR in the mid 60s. Troponin negative x 1. CXR showed RML PNA, he was started on azithromycin and Rocephin, inhalers, and prednisone.   Physical Exam:  GEN well developed, no acute distress   HEENT hearing intact to voice   RESP normal resp effort  wheezing  rhonchi   CARD Irregular rate and rhythm  No murmur   ABD denies tenderness  soft   LYMPH negative neck   EXTR negative edema   SKIN normal to palpation   NEURO cranial nerves intact   PSYCH alert, A+O to time, place, person   Review of Systems:  Subjective/Chief Complaint SOB   General: Fatigue   Skin: No Complaints   ENT: congestion   Eyes: No Complaints   Neck: No Complaints   Respiratory: Frequent cough  Short of breath  Sputum   Cardiovascular: Dyspnea  Orthopnea   Gastrointestinal:  No Complaints   Genitourinary: No Complaints   Vascular: No Complaints   Musculoskeletal: No Complaints   Neurologic: No Complaints   Hematologic: No Complaints   Endocrine: No Complaints   Psychiatric: No Complaints   Review of Systems: All other systems were reviewed and found to be negative   Medications/Allergies Reviewed Medications/Allergies reviewed   Family & Social History:  Family and Social History:  Family History patient is adopted - unable to provide   Social History positive ETOH, positive Illicit drugs   + Tobacco Current (within 1 year)  110 pack years; currently drinks 8-10 twelve oz beers daily (prior h/o 1 case daily); remote h/o THC   Place of Living Home     Gout:    Home Medications: Medication Instructions Status  indomethacin 50 mg oral capsule 1 cap(s) orally 3 times a day, As Needed for gout flare Active   Lab Results:  Hepatic:  10-Feb-16 07:09   Bilirubin, Total  1.1  Alkaline Phosphatase  143  SGPT (ALT) 26  SGOT (AST) 25  Total Protein, Serum 7.0  Albumin, Serum 3.8  Routine Chem:  10-Feb-16 07:09   Cholesterol, Serum 113  Triglycerides, Serum 106  HDL (INHOUSE)  36  VLDL Cholesterol Calculated 21  LDL Cholesterol Calculated 56 (Result(s) reported on 29 Jun 2014 at 09:45AM.)  Glucose, Serum  135  BUN  5  Creatinine (comp) 0.99  Sodium, Serum 137  Potassium, Serum 4.0  Chloride, Serum 101  CO2, Serum 24  Calcium (Total), Serum 8.7  Osmolality (calc) 273  eGFR (African American) >60  eGFR (Non-African American) >60 (eGFR values <56m/min/1.73 m2 may be an indication of chronic kidney disease (CKD). Calculated eGFR, using the MRDR Study equation, is useful in  patients with stable renal function. The eGFR calculation will not be reliable in acutely ill patients when serum creatinine is changing rapidly. It is not useful in patients on dialysis. The eGFR calculation may not be applicable to patients at the low and high extremes of body sizes, pregnant women, and vegetarians.)  Anion Gap 12  Cardiac:  10-Feb-16 07:09   Troponin I < 0.02 (0.00-0.05 0.05 ng/mL or less: NEGATIVE  Repeat testing in 3-6 hrs  if clinically indicated. >0.05 ng/mL: POTENTIAL  MYOCARDIAL INJURY. Repeat  testing in 3-6 hrs if  clinically indicated. NOTE: An increase or decrease  of 30% or more on serial  testing suggests a  clinically important change)  Routine Hem:  10-Feb-16 07:09   WBC (CBC) 7.2  RBC (CBC) 5.19  Hemoglobin (CBC)  18.3  Hematocrit (CBC)  55.3  Platelet Count (CBC) 155 (Result(s) reported on 29 Jun 2014 at 07:40AM.)  MCV  107  MCH  35.3  MCHC 33.1  RDW 13.3   EKG:  EKG Interp. by me   Interpretation EKG shows  a-fib, 110, LBBB   Radiology Results: XRay:    10-Feb-16 07:43, Chest Portable Single View  Chest Portable Single View   REASON FOR EXAM:    Shortness of Breath  COMMENTS:       PROCEDURE: DXR - DXR PORTABLE CHEST SINGLE VIEW  - Jun 29 2014  7:43AM     CLINICAL DATA:  Increasing shortness of breath for 2 days, history  of smoking    EXAM:  PORTABLE CHEST - 1 VIEW    COMPARISON:  None.    FINDINGS:  Cardiac shadow is within normal limits. The lungs are well aerated  bilaterally. Increased density is noted in the medial  right lung  base likely rib within the right lower lobe consistent with early  infiltrate.     IMPRESSION:  Increased density within the medial right lung base consistent with  acute infiltrate. Followup films following appropriate therapy  recommended.      Electronically Signed    By: Inez Catalina M.D.    On: 06/29/2014 08:11       Verified By: Everlene Farrier, M.D.,    No Known Allergies:   Vital Signs/Nurse's Notes: **Vital Signs.:   10-Feb-16 10:17  Vital Signs Type Admission  Temperature Temperature (F) 98.1  Celsius 36.7  Temperature Source oral  Pulse Pulse 79  Respirations Respirations 20  Systolic BP Systolic BP 220  Diastolic BP (mmHg) Diastolic BP (mmHg) 71  Mean BP 89  Pulse Ox % Pulse Ox % 93    Impression 69 year old male with history of long standing tobacco abuse (110 pack years), long standing alcohol abuse (up to 1 case per day), and chronic low back and knee pain who presented to [facility] today with a 2 day history of increased SOB, he was found to be in new onset a-fib with RVR heart rates in the 120s and RML PNA.  1. New onset a-fib: -Currently rate controlled -Diltiazem 30 mg q 6 hours -CHADSVASc at least 1, hold long term anticoagulation at this time pending echo and A1C  2. LBBB: -Uncertain chronicity  -Will need ischemic evaluation, could be planned as outpatient when PNA is resolved  -Check echo to evaluate LV  function and wall motion   3. PNA/COPD: -On azithromycin and Rocephin per IM -Consider changing albuterol to Xopenex -Steroids per IM  4. Polysubstance abuse: -Discussed quitting   5. Hyperglycemia: -Check A1C   Electronic Signatures: Christell Faith M (PA-C)  (Signed 10-Feb-16 12:23)  Authored: General Aspect/Present Illness, History and Physical Exam, Review of System, Family & Social History, Past Medical History, Home Medications, Labs, EKG , Radiology, Allergies, Vital Signs/Nurse's Notes, Impression/Plan Ida Rogue (MD)  (Signed 10-Feb-16 13:16)  Authored: General Aspect/Present Illness, History and Physical Exam, Review of System, Family & Social History, Labs, EKG , Vital Signs/Nurse's Notes, Impression/Plan  Co-Signer: General Aspect/Present Illness, History and Physical Exam, Review of System, Family & Social History, Past Medical History, Home Medications, Labs, EKG , Radiology, Allergies, Vital Signs/Nurse's Notes, Impression/Plan   Last Updated: 10-Feb-16 13:16 by Ida Rogue (MD)

## 2014-09-18 NOTE — Discharge Summary (Signed)
PATIENT NAME:  Devin Ramsey, Devin Ramsey MR#:  [id number] DATE OF BIRTH:  1945/08/01  DATE OF ADMISSION:  06/29/2014 DATE OF DISCHARGE:  07/04/2014  PRIMARY CARE PHYSICIAN: Nonlocal.  DISCHARGE DIAGNOSES:  1.  Pneumonia. 2.  Bronchitis. 3.  Chronic obstructive pulmonary disease exacerbation. 4.  Atrial fibrillation with rapid ventricular response.  5.  Acute systolic congestive heart failure with ejection fraction 35% to 40%. 6.  Small bilateral pleural effusion.  7.  Tobacco abuse.   CONDITION: Stable.   CODE STATUS: FULL code.   HOME MEDICATIONS: Please refer to the medication reconciliation list.   DISCHARGE DIET: Low-sodium, low-fat diet.   HOME OXYGEN: 2 liters by nasal cannula.  ACTIVITY: As tolerated.   DISCHARGE INSTRUCTIONS AND FOLLOW-UP CARE: Follow up with PCP, Dr. [name] and Dr. Meredeth Ide within 1 to 2 weeks. The patient also needs to follow up at the heart failure clinic. The patient needs to hold hypertension and CHF medication if systolic blood pressure less than 100 or diastolic blood pressure less than 60.   REASON FOR ADMISSION: Shortness of breath.   HOSPITAL COURSE: The patient is a 69 year old male with past history of gout presented to the ED with shortness of breath for 2 days with cough and clear sputum. Also, the patient complained of wheezing. In the ED, the patient was noticed to have tachycardia. EKG showed A-fib. The patient was treated with Cardizem injection. Heart rate was better. Chest x-ray showed pneumonia. For detailed history and physical examination, please refer to the admission note dictated by Dr. Elisabeth Pigeon. On admission date, the patient's WBC was 7.2, hemoglobin 18.3, and platelets 155,000. Troponin less than 0.02.  1.  Pneumonia with bronchitis. After admission, the patient was treated with Rocephin and Zithromax with nebulizer treatment which was changed to p.o. Levaquin. In addition, the patient has been treated with oxygen by nasal cannula 4 liters and  then decreased to 2 liters.  2.  For COPD exacerbation, the patient has been treated with nebulizer treatment with Spiriva and Pulmicort. In addition, the patient was treated with IV Solu-Medrol which was changed to p.o. prednisone. The patient has been on oxygen by nasal cannula. The patient's O2 saturation decreased to 87% without oxygen. He needs home oxygen 2 liters by nasal cannula.  3.  A-fib with RVR, which has been treated with Cardizem and Toprol. In addition, according to cardiology consult, Dr. [name] suggests starting Eliquis. The patient needs to follow up with cardiologist as outpatient for possible cardioversion in 3 to 4 weeks if he remains in A-fib.  4.   Acute systolic CHF with ejection fraction 35% to 40% with small bilateral pleural effusion. Since the patient continues to have shortness of breath, the patient got a chest x-ray again, which showed pulmonary edema. Dr. [name] suggests start Lasix 20 mg p.o. b.i.d. which was later changed to 40 mg p.o. b.i.d. In addition, ACE inhibitor and lisinopril were added. The patient's symptoms have much improved. He will be discharged with Lasix, lisinopril and Aldactone.  5.  Tobacco use. The patient was counseled for smoking cessation.   The patient's symptoms have much improved, but still needs 2 liters oxygen by nasal cannula. The patient is clinically stable and will be discharged to home today. I discussed the patient's discharge plan with the patient, nurse and case manager.   TIME SPENT: About 45 minutes.   ____________________________ Shaune Pollack, MD qc:sb D: 07/04/2014 11:14:35 ET T: 07/04/2014 14:03:55 ET JOB#: [id number]  cc: Shaune Pollack, MD, <Dictator>  Shaune Pollack MD ELECTRONICALLY SIGNED 07/04/2014 14:58

## 2014-10-06 ENCOUNTER — Telehealth: Payer: Self-pay | Admitting: Cardiovascular Disease

## 2014-10-06 MED ORDER — APIXABAN 5 MG PO TABS: 5.0000 mg | ORAL_TABLET | Freq: Two times a day (BID) | ORAL | Status: DC

## 2014-10-06 NOTE — Telephone Encounter (Signed)
Patient also has bruising that he read on eliquis bottle to be a possible side effect and wants to know if this is likely and can this be improved or fixed?

## 2014-10-06 NOTE — Addendum Note (Signed)
Addended by: Rhea Belton R on: 10/06/2014 11:17 AM   Modules accepted: Orders, Medications

## 2014-10-06 NOTE — Telephone Encounter (Signed)
Spoke w/ pt.  Advised him that I am unclear as to what med he is asking about. He will call back w/ and spell the name of the med so that I can ask Dr. [name].  He reports that Pradaxa tier exemption was denied and he would like to continue taking Eliquis 5 mg BID.

## 2014-10-06 NOTE — Telephone Encounter (Signed)
Patient has pain in L knee and wants to know if it is ok to take indacin bc this may have a drug interaction with Eliquis   Changed Rx from Eliqus to Pradaxa recently and patient says insurance will not switch to generic pradaxa  What should we do ? Please call patient.

## 2014-12-26 ENCOUNTER — Telehealth: Payer: Self-pay | Admitting: Cardiovascular Disease

## 2014-12-26 NOTE — Telephone Encounter (Signed)
Pt c/o redness on his arm after a mosquito bite. Evaluated pt's rt arm. He does have some redness on his arm w/ a minimal amount of bruising that has not spread. Advised pt to stop scratching the area and see his PCP about itching. He states that his wife has some cream at home that helps considerably, he will see if she has any left.

## 2014-12-26 NOTE — Telephone Encounter (Signed)
Patient in office and has concerns appt bruising/redness on arm where he was bitten by mosquito.  Patient spoke with [facility].

## 2015-01-13 ENCOUNTER — Telehealth: Payer: Self-pay | Admitting: *Deleted

## 2015-01-13 NOTE — Telephone Encounter (Signed)
Patient is having tingling in his fingers on the right hand and pain in ribs right side. He is wondering if side effect to eliquis and amiodaron. Please call patient.

## 2015-01-14 NOTE — Telephone Encounter (Signed)
These are not side effects typically associated with these two meds. Sounds like nerve issue (tingling) Ribs unclear, musculoskeletal?

## 2015-01-16 NOTE — Telephone Encounter (Signed)
Spoke w/ pt.  Advised him of Dr. Windell Hummingbird recommendation.  Reports that he took a Gas-[employer] and his sx were relieved.   He reports that he is breaking out on his arm, advised him to contact his PCP. He is agreeable and will call back w/ any questions or concerns.

## 2015-01-17 DIAGNOSIS — I447 Left bundle-branch block, unspecified: Secondary | ICD-10-CM | POA: Insufficient documentation

## 2015-01-18 DIAGNOSIS — N183 Chronic kidney disease, stage 3 unspecified: Secondary | ICD-10-CM | POA: Insufficient documentation

## 2015-01-24 ENCOUNTER — Ambulatory Visit
Admission: RE | Admit: 2015-01-24 | Discharge: 2015-01-24 | Disposition: A | Discharge status: Home / Self Care | Payer: Medicare HMO | Source: Ambulatory Visit | Attending: Family Medicine | Admitting: Family Medicine

## 2015-01-24 ENCOUNTER — Other Ambulatory Visit: Payer: Self-pay | Admitting: Family Medicine

## 2015-01-24 DIAGNOSIS — N200 Calculus of kidney: Secondary | ICD-10-CM | POA: Diagnosis not present

## 2015-01-24 DIAGNOSIS — R1011 Right upper quadrant pain: Secondary | ICD-10-CM | POA: Diagnosis present

## 2015-01-24 DIAGNOSIS — I700 Atherosclerosis of aorta: Secondary | ICD-10-CM | POA: Insufficient documentation

## 2015-01-24 DIAGNOSIS — N281 Cyst of kidney, acquired: Secondary | ICD-10-CM | POA: Insufficient documentation

## 2015-01-24 MED ORDER — IOHEXOL 300 MG/ML  SOLN
100.0000 mL | Freq: Once | INTRAMUSCULAR | Status: AC | PRN
  Administered 2015-01-24: 100 mL via INTRAVENOUS

## 2015-02-28 ENCOUNTER — Ambulatory Visit (INDEPENDENT_AMBULATORY_CARE_PROVIDER_SITE_OTHER): Payer: Medicare HMO | Admitting: Cardiovascular Disease

## 2015-02-28 ENCOUNTER — Encounter: Payer: Self-pay | Admitting: Cardiovascular Disease

## 2015-02-28 VITALS — BP 110/60 | HR 72 | Resp 16 | Ht 70.0 in | Wt 169.2 lb

## 2015-02-28 DIAGNOSIS — I5022 Chronic systolic (congestive) heart failure: Secondary | ICD-10-CM

## 2015-02-28 DIAGNOSIS — I10 Essential (primary) hypertension: Secondary | ICD-10-CM | POA: Diagnosis not present

## 2015-02-28 DIAGNOSIS — I420 Dilated cardiomyopathy: Secondary | ICD-10-CM

## 2015-02-28 DIAGNOSIS — I4891 Unspecified atrial fibrillation: Secondary | ICD-10-CM | POA: Diagnosis not present

## 2015-02-28 DIAGNOSIS — J441 Chronic obstructive pulmonary disease with (acute) exacerbation: Secondary | ICD-10-CM

## 2015-02-28 NOTE — Assessment & Plan Note (Signed)
History maintaining normal sinus rhythm. No clinical symptoms concerning for arrhythmia

## 2015-02-28 NOTE — Assessment & Plan Note (Signed)
Echocardiogram has been ordered to reevaluate his ejection fractionnow that he is in normal sinus rhythm. Previously ordered but he did not complete the study Previous ejection fraction 35%

## 2015-02-28 NOTE — Patient Instructions (Addendum)
You are doing well. No medication changes were made.   We will schedule an echocardiogram for atrial fibrillation  _____________________________________________  If you feel dizzy, Take the diltiazem at dinner  Please call us if you have new issues that need to be addressed before your next appt.  Your physician wants you to follow-up in: 6 months.  You will receive a reminder letter in the mail two months in advance. If you don't receive a letter, please call our office to schedule the follow-up appointment.  Echocardiogram An echocardiogram, or echocardiography, uses sound waves (ultrasound) to produce an image of your heart. The echocardiogram is simple, painless, obtained within a short period of time, and offers valuable information to your health care provider. The images from an echocardiogram can provide information such as:  Evidence of coronary artery disease (CAD).  Heart size.  Heart muscle function.  Heart valve function.  Aneurysm detection.  Evidence of a past heart attack.  Fluid buildup around the heart.  Heart muscle thickening.  Assess heart valve function. LET [facility] CARE PROVIDER KNOW ABOUT:  Any allergies you have.  All medicines you are taking, including vitamins, herbs, eye drops, creams, and over-the-counter medicines.  Previous problems you or members of your family have had with the use of anesthetics.  Any blood disorders you have.  Previous surgeries you have had.  Medical conditions you have.  Possibility of pregnancy, if this applies. BEFORE THE PROCEDURE  No special preparation is needed. Eat and drink normally.  PROCEDURE   In order to produce an image of your heart, gel will be applied to your chest and a wand-like tool (transducer) will be moved over your chest. The gel will help transmit the sound waves from the transducer. The sound waves will harmlessly bounce off your heart to allow the heart images to be captured in  real-time motion. These images will then be recorded.  You may need an IV to receive a medicine that improves the quality of the pictures. AFTER THE PROCEDURE You may return to your normal schedule including diet, activities, and medicines, unless your health care provider tells you otherwise.   This information is not intended to replace advice given to you by your health care provider. Make sure you discuss any questions you have with your health care provider.   Document Released: 05/03/2000 Document Revised: 05/27/2014 Document Reviewed: 01/11/2013 Elsevier Interactive Patient Education [employer]

## 2015-02-28 NOTE — Assessment & Plan Note (Signed)
Long history of smoking in the past. Stopped early 2016 No recent episodes of COPD exacerbation

## 2015-02-28 NOTE — Assessment & Plan Note (Signed)
Appears to be relatively euvolemic if not dry Repeat echocardiogram has been ordered Ejection fraction may have improved back to normal in normal sinus rhythm

## 2015-02-28 NOTE — Progress Notes (Signed)
Patient ID: Devin Ramsey, male    DOB: Feb 04, 1946, 69 y.o.   MRN: [id number]  HPI Comments: Devin Ramsey is a 69 year old gentleman who presented to the hospital 06/2014 with bronchitis symptoms, EKG with new atrial fibrillation, chest x-ray showing pneumonia who was kept in the hospital for 5 days, COPD exacerbation. He had cardioversion 08/23/2014 for A. fib flutter, He follows up today to discuss atrial fibrillation management Previous echocardiogram showed ejection fraction 35-40%, February 2016 in the setting of atrial fibrillation Stop smoking 06/2014, wife still smokes  In follow-up, he reports that he is feeling well. On a prior clinic visit, he stopped many of his medications including metoprolol,  lisinopril, Lasix, spironolactone Diltiazem was restarted for blood pressure and rhythm control He continues to take  amiodarone 200 mg daily, eliquis 5 mg twice a day, diltiazem Denies any significant lower extremity edema Reports having dehydration and primary care decreased his Lasix down to 20 mg daily Denies shortness of breath or fluid retention Reports that he stop smoking in early 2016 Echocardiogram previously ordered for new baseline in normal sinus rhythm. He did not complete this  EKG on today's visit shows normal sinus rhythm with left bundle branch block  Other past medical history Lab work from the hospital showed sodium 132, potassium 5.1, hemoglobin A1c 5.3, negative cardiac enzymes, Total cholesterol 113, LDL 56, hematocrit 49, creatinine 1.02, magnesium 2.1  Chest x-ray with interstitial pulmonary edema, small bilateral pleural effusions  Echocardiogram reviewed with him from 06/29/2014 showing ejection fraction 35-40%, septal motion consistent with bundle branch block, normal right ventricular systolic pressure, normal left atrial size   Allergies  Allergen Reactions  . No Known Allergies     Outpatient Encounter Prescriptions as of 02/28/2015  Medication Sig   . albuterol (PROVENTIL HFA;VENTOLIN HFA) 108 (90 BASE) MCG/ACT inhaler Inhale 2 puffs into the lungs every 6 (six) hours as needed for wheezing or shortness of breath.  Marland Kitchen amiodarone (PACERONE) 200 MG tablet Take 1 tablet (200 mg total) by mouth daily.  Marland Kitchen apixaban (ELIQUIS) 5 MG TABS tablet Take 1 tablet (5 mg total) by mouth 2 (two) times daily.  Marland Kitchen CARTIA XT 120 MG 24 hr capsule Take 1 capsule (120 mg total) by mouth daily.  . furosemide (LASIX) 20 MG tablet Take 20 mg by mouth daily.  . [DISCONTINUED] furosemide (LASIX) 40 MG tablet Take 1 tablet (40 mg total) by mouth 2 (two) times daily. (Patient not taking: Reported on 02/28/2015)  . [DISCONTINUED] Neomycin-Bacitracin-Polymyxin (HCA TRIPLE ANTIBIOTIC OINTMENT EX) as needed.   . [DISCONTINUED] NON FORMULARY Equate Nicotine patch (OTC)  . [DISCONTINUED] OXYGEN 2 liters daily.  . [DISCONTINUED] SPIRIVA HANDIHALER 18 MCG inhalation capsule    No facility-administered encounter medications on file as of 02/28/2015.    Past Medical History  Diagnosis Date  . Pneumonia 2016  . COPD (chronic obstructive pulmonary disease) (HCC)   . A-fib (HCC)   . Tobacco abuse   . Acute systolic CHF (congestive heart failure) (HCC)     EF 35% to 40%  . Bilateral pleural effusion     Past Surgical History  Procedure Laterality Date  . Hemorrhoid surgery    . Shoulder surgery      right shoulder     Social History  reports that he quit smoking about 8 months ago. His smoking use included Cigarettes. He quit after 55 years of use. He does not have any smokeless tobacco history on file. He reports that he does  not drink alcohol or use illicit drugs.  Family History He was adopted. Family history is unknown by patient.  Review of Systems  Constitutional: Negative.   Respiratory: Negative.   Cardiovascular: Negative.   Gastrointestinal: Negative.   Musculoskeletal: Negative.   Skin: Negative.   Neurological: Negative.   Hematological: Negative.    Psychiatric/Behavioral: Negative.   All other systems reviewed and are negative.   BP 110/60 mmHg  Pulse 72  Resp 16  Ht 5\' 10"  (1.778 m)  Wt 169 lb 4 oz (76.771 kg)  BMI 24.28 kg/m2  Physical Exam  Constitutional: He is oriented to person, place, and time. He appears well-developed and well-nourished.  HENT:  Head: Normocephalic.  Nose: Nose normal.  Mouth/Throat: Oropharynx is clear and moist.  Eyes: Conjunctivae are normal. Pupils are equal, round, and reactive to light.  Neck: Normal range of motion. Neck supple. No JVD present.  Cardiovascular: Normal rate, regular rhythm, S1 normal, S2 normal, normal heart sounds and intact distal pulses.  Exam reveals no gallop and no friction rub.   No murmur heard. Pulmonary/Chest: Effort normal and breath sounds normal. No respiratory distress. He has no wheezes. He has no rales. He exhibits no tenderness.  Abdominal: Soft. Bowel sounds are normal. He exhibits no distension. There is no tenderness.  Musculoskeletal: Normal range of motion. He exhibits no edema or tenderness.  Lymphadenopathy:    He has no cervical adenopathy.  Neurological: He is alert and oriented to person, place, and time. Coordination normal.  Skin: Skin is warm and dry. No rash noted. No erythema.  Psychiatric: He has a normal mood and affect. His behavior is normal. Judgment and thought content normal.      Assessment and Plan   Nursing note and vitals reviewed.

## 2015-02-28 NOTE — Assessment & Plan Note (Signed)
Blood pressure is well controlled on today's visit. No changes made to the medications. 

## 2015-03-01 ENCOUNTER — Other Ambulatory Visit: Payer: Self-pay | Admitting: Cardiovascular Disease

## 2015-03-02 ENCOUNTER — Telehealth: Payer: Self-pay | Admitting: *Deleted

## 2015-03-02 MED ORDER — APIXABAN 5 MG PO TABS: 5.0000 mg | ORAL_TABLET | Freq: Two times a day (BID) | ORAL | Status: DC

## 2015-03-02 NOTE — Telephone Encounter (Signed)
Refill sent for eliquis  

## 2015-03-02 NOTE — Telephone Encounter (Signed)
  1. Which medications need to be refilled? Eloquis  2. Which pharmacy is medication to be sent to? [facility] [address]  3. Do they need a 30 day or 90 day supply? 90 days

## 2015-03-13 ENCOUNTER — Telehealth: Payer: Self-pay | Admitting: *Deleted

## 2015-03-13 ENCOUNTER — Telehealth: Payer: Self-pay

## 2015-03-13 NOTE — Telephone Encounter (Signed)
Received cardiac clearance request for pt to proceed w/ colonoscopy & EGD on 03/28/15 w/ Dr. Marva Panda. Per Dr. [name], pt is cleared to proceed w/ procedure and hold Elquis for 3 days prior to procedure. Faxed to [facility] @ [phone].

## 2015-03-13 NOTE — Telephone Encounter (Signed)
Patient called and wants to know if he can take Indomethacin 50mg  for gout. Patient is having a gout flair up and needs to know if the can take this medication with his heart condition.

## 2015-03-13 NOTE — Telephone Encounter (Signed)
We tried to avoid mixing NSAIDs/indomethacin with blood thinners Would potentially suggest to primary care that we try colchicine Can inject steroid in the joint as well Allopurinol can be used for long-term use  Other medications listed should be okay like albuterol and Lasix

## 2015-03-13 NOTE — Telephone Encounter (Signed)
I see that indomethacin interacts w/  Eliquis (serious), albuterol (significant) & furosemide (serious). He would like to know if his PCP can prescribe this for him.

## 2015-03-14 NOTE — Telephone Encounter (Signed)
Spoke w/ pt.  Advised him of Dr. Windell Hummingbird recommendation.  He reports that colchicine makes him sick on his stomach. His PCP prescribed him prednisone, which is making him feel better. He reports that the bottle instructions state to take 1 a day, but he has been taking 3 a day.  Advised him to contact PCP for correct instructions, as this is usually a taper.  He will keep appt next week for ECHO.

## 2015-03-23 ENCOUNTER — Ambulatory Visit (INDEPENDENT_AMBULATORY_CARE_PROVIDER_SITE_OTHER): Payer: Medicare HMO

## 2015-03-23 ENCOUNTER — Other Ambulatory Visit: Payer: Self-pay

## 2015-03-23 ENCOUNTER — Telehealth: Payer: Self-pay | Admitting: Cardiovascular Disease

## 2015-03-23 DIAGNOSIS — I4891 Unspecified atrial fibrillation: Secondary | ICD-10-CM

## 2015-03-23 MED ORDER — SILDENAFIL CITRATE 20 MG PO TABS: 20.0000 mg | ORAL_TABLET | Freq: Three times a day (TID) | ORAL | Status: DC

## 2015-03-23 NOTE — Telephone Encounter (Signed)
Attempted to contact pt. No answer at home # or cell #, no vm set up, unable to leave message at either #.

## 2015-03-23 NOTE — Telephone Encounter (Signed)
Spoke w/ pt.  He was here in the office for a procedure.  He reports black discoloration on his right arm and near his waist.  Pt was carrying loads of wood in his arms. Advised him that this most likely caused the bruising and for him to find another way to transport wood, He reports that the discoloration is not spreading. Asked him to call back if sx worsen or do not seem to be fading. He is agreeable and requests an rx for viagra, but reports it is too expensive. Per Dr. [name], I am sending in revatio in hopes this is more affordable.

## 2015-03-23 NOTE — Telephone Encounter (Signed)
Patient c/o soreness in upper Right arm and says it is black all the way around

## 2015-03-24 ENCOUNTER — Ambulatory Visit (INDEPENDENT_AMBULATORY_CARE_PROVIDER_SITE_OTHER): Payer: Medicare HMO | Admitting: Cardiovascular Disease

## 2015-03-24 ENCOUNTER — Encounter: Payer: Self-pay | Admitting: Cardiovascular Disease

## 2015-03-24 ENCOUNTER — Telehealth: Payer: Self-pay

## 2015-03-24 VITALS — BP 124/70 | HR 68 | Ht 70.0 in | Wt 175.2 lb

## 2015-03-24 DIAGNOSIS — I10 Essential (primary) hypertension: Secondary | ICD-10-CM | POA: Diagnosis not present

## 2015-03-24 DIAGNOSIS — I420 Dilated cardiomyopathy: Secondary | ICD-10-CM | POA: Diagnosis not present

## 2015-03-24 DIAGNOSIS — I5022 Chronic systolic (congestive) heart failure: Secondary | ICD-10-CM

## 2015-03-24 DIAGNOSIS — S46119A Strain of muscle, fascia and tendon of long head of biceps, unspecified arm, initial encounter: Secondary | ICD-10-CM | POA: Insufficient documentation

## 2015-03-24 DIAGNOSIS — S46211A Strain of muscle, fascia and tendon of other parts of biceps, right arm, initial encounter: Secondary | ICD-10-CM

## 2015-03-24 DIAGNOSIS — J441 Chronic obstructive pulmonary disease with (acute) exacerbation: Secondary | ICD-10-CM | POA: Diagnosis not present

## 2015-03-24 DIAGNOSIS — S46111A Strain of muscle, fascia and tendon of long head of biceps, right arm, initial encounter: Secondary | ICD-10-CM

## 2015-03-24 DIAGNOSIS — I4891 Unspecified atrial fibrillation: Secondary | ICD-10-CM | POA: Diagnosis not present

## 2015-03-24 DIAGNOSIS — S46219A Strain of muscle, fascia and tendon of other parts of biceps, unspecified arm, initial encounter: Secondary | ICD-10-CM | POA: Insufficient documentation

## 2015-03-24 MED ORDER — SILDENAFIL CITRATE 20 MG PO TABS: 20.0000 mg | ORAL_TABLET | Freq: Three times a day (TID) | ORAL | Status: DC | PRN

## 2015-03-24 NOTE — Assessment & Plan Note (Signed)
Large region of ecchymosis on the right Good range of motion, likely nonsurgical issue Bruising exacerbated by anticoagulation Medical management at this time

## 2015-03-24 NOTE — Assessment & Plan Note (Signed)
Blood pressure is well controlled on today's visit. No changes made to the medications. 

## 2015-03-24 NOTE — Patient Instructions (Addendum)
Cardiac function is still mildly depressed No medication changes were made.  We will try to obtain your old cholesterol numbers  Please call if you have any chest pain or worsening shortness of breathing  Please call us if you have new issues that need to be addressed before your next appt.  Your physician wants you to follow-up in: 6 months.  You will receive a reminder letter in the mail two months in advance. If you don't receive a letter, please call our office to schedule the follow-up appointment.

## 2015-03-24 NOTE — Assessment & Plan Note (Signed)
Maintaining normal sinus rhythm. No medication changes made 

## 2015-03-24 NOTE — Assessment & Plan Note (Signed)
Depressed ejection fraction even on repeat echocardiogram in the setting of normal sinus rhythm Underlying left bundle branch, Both of these items raise the concern of ischemic cardiomyopathy. We have recommended either perfusion Myoview or cardiac catheterization. He has declined both at this time. Currently feels asymptomatic with no angina Recommended he call us for any symptoms of worsening shortness of breath We will recommend a lipid panel, none available in our records

## 2015-03-24 NOTE — Assessment & Plan Note (Signed)
Reports his shortness of breath has improved following recent COPD exacerbation

## 2015-03-24 NOTE — Assessment & Plan Note (Signed)
Recommended that he stay on Lasix daily. He may benefit from low-dose ACE inhibitor or ARB We will avoid beta blockers in the setting of COPD

## 2015-03-24 NOTE — Telephone Encounter (Signed)
Prior auth for Sildenafil 20mg sent to [employer]. 

## 2015-03-24 NOTE — Progress Notes (Signed)
Patient ID: DELOSS AMICO, male    DOB: 05-02-1946, 69 y.o.   MRN: [id number]  HPI Comments: Mr. Emond is a 69 year old gentleman who presented to the hospital 06/2014 with bronchitis symptoms, EKG with new atrial fibrillation, chest x-ray showing pneumonia who was kept in the hospital for 5 days, COPD exacerbation. He had cardioversion 08/23/2014 for A. fib flutter, He follows up today to discuss atrial fibrillation management Previous echocardiogram showed ejection fraction 35-40%, February 2016 in the setting of atrial fibrillation Stop smoking 06/2014, wife still smokes  He presents today for follow-up after recent echocardiogram Ejection fraction remains low at 35-40%, no significant improvement by restoring normal sinus rhythm  long smoking history, stopped less than one year ago, significant COPD Currently denies any symptoms of shortness of breath, leg edema  Approximately 6 weeks ago reports he was lifting something heavy and developed a sharp pain in his right biceps Following this had severe bruising. Patient is on eliquis. Tenderness in his arm since then, slowly improving  EKG on today's visit shows normal sinus rhythm with left bundle branch block, no change from prior EKG  Other past medical history Lab work from the hospital showed sodium 132, potassium 5.1, hemoglobin A1c 5.3, negative cardiac enzymes, Total cholesterol 113, LDL 56, hematocrit 49, creatinine 1.02, magnesium 2.1  Chest x-ray with interstitial pulmonary edema, small bilateral pleural effusions  Echocardiogram reviewed with him from 06/29/2014 showing ejection fraction 35-40%, septal motion consistent with bundle branch block, normal right ventricular systolic pressure, normal left atrial size   Allergies  Allergen Reactions  . No Known Allergies     Outpatient Encounter Prescriptions as of 03/24/2015  Medication Sig  . albuterol (PROVENTIL HFA;VENTOLIN HFA) 108 (90 BASE) MCG/ACT inhaler Inhale 2 puffs  into the lungs every 6 (six) hours as needed for wheezing or shortness of breath.  Marland Kitchen amiodarone (PACERONE) 200 MG tablet Take 1 tablet (200 mg total) by mouth daily.  Marland Kitchen apixaban (ELIQUIS) 5 MG TABS tablet Take 1 tablet (5 mg total) by mouth 2 (two) times daily.  Marland Kitchen CARTIA XT 120 MG 24 hr capsule Take 1 capsule (120 mg total) by mouth daily.  . furosemide (LASIX) 20 MG tablet Take 20 mg by mouth daily.  . sildenafil (REVATIO) 20 MG tablet Take 1 tablet (20 mg total) by mouth 3 (three) times daily as needed.  . [DISCONTINUED] sildenafil (REVATIO) 20 MG tablet Take 1 tablet (20 mg total) by mouth 3 (three) times daily.  . [DISCONTINUED] ELIQUIS 5 MG TABS tablet TAKE ONE TABLET BY MOUTH TWICE DAILY (Patient not taking: Reported on 03/24/2015)   No facility-administered encounter medications on file as of 03/24/2015.    Past Medical History  Diagnosis Date  . Pneumonia 2016  . COPD (chronic obstructive pulmonary disease) (HCC)   . A-fib (HCC)   . Tobacco abuse   . Acute systolic CHF (congestive heart failure) (HCC)     EF 35% to 40%  . Bilateral pleural effusion     Past Surgical History  Procedure Laterality Date  . Hemorrhoid surgery    . Shoulder surgery      right shoulder     Social History  reports that he quit smoking about 8 months ago. His smoking use included Cigarettes. He quit after 55 years of use. He does not have any smokeless tobacco history on file. He reports that he does not drink alcohol or use illicit drugs.  Family History He was adopted. Family history is unknown  by patient.  Review of Systems  Constitutional: Negative.   Respiratory: Positive for shortness of breath.   Cardiovascular: Negative.   Gastrointestinal: Negative.   Musculoskeletal: Negative.   Skin:       Significant bruising over his right biceps region  Neurological: Negative.   Hematological: Negative.   Psychiatric/Behavioral: Negative.   All other systems reviewed and are  negative.   BP 124/70 mmHg  Pulse 68  Ht 5\' 10"  (1.778 m)  Wt 175 lb 4 oz (79.493 kg)  BMI 25.15 kg/m2  Physical Exam  Constitutional: He is oriented to person, place, and time. He appears well-developed and well-nourished.  HENT:  Head: Normocephalic.  Nose: Nose normal.  Mouth/Throat: Oropharynx is clear and moist.  Eyes: Conjunctivae are normal. Pupils are equal, round, and reactive to light.  Neck: Normal range of motion. Neck supple. No JVD present.  Cardiovascular: Normal rate, regular rhythm, S1 normal, S2 normal, normal heart sounds and intact distal pulses.  Exam reveals no gallop and no friction rub.   No murmur heard. Pulmonary/Chest: Effort normal and breath sounds normal. No respiratory distress. He has no wheezes. He has no rales. He exhibits no tenderness.  Abdominal: Soft. Bowel sounds are normal. He exhibits no distension. There is no tenderness.  Musculoskeletal: Normal range of motion. He exhibits no edema or tenderness.  Right biceps area with significant ecchymosis, mild tenderness over the biceps region  Lymphadenopathy:    He has no cervical adenopathy.  Neurological: He is alert and oriented to person, place, and time. Coordination normal.  Skin: Skin is warm and dry. No rash noted. No erythema.  Psychiatric: He has a normal mood and affect. His behavior is normal. Judgment and thought content normal.      Assessment and Plan   Nursing note and vitals reviewed.

## 2015-04-01 ENCOUNTER — Other Ambulatory Visit: Payer: Self-pay | Admitting: Cardiovascular Disease

## 2015-04-07 ENCOUNTER — Ambulatory Visit: Admission: RE | Admit: 2015-04-07 | Payer: Medicare HMO | Source: Ambulatory Visit | Admitting: Gastroenterology

## 2015-04-07 ENCOUNTER — Encounter: Admission: RE | Payer: Self-pay | Source: Ambulatory Visit

## 2015-04-07 SURGERY — COLONOSCOPY WITH PROPOFOL
Anesthesia: General

## 2015-05-02 NOTE — Telephone Encounter (Signed)
Provider from [facility] called, states pt came into the office yesterday, with "swimmy headedness" . Provider states pt EKG does not "look" different from one performed in August. However, she states that the cardiologist they have read the EKG from yesterday, and states there are changes, and asks the pt to call and get an appt this week. Please review EKG from yesterday in [facility] and advise if pt needs urgent appt.

## 2015-05-02 NOTE — Telephone Encounter (Signed)
This encounter was created in error - please disregard.

## 2015-05-02 NOTE — Telephone Encounter (Signed)
Requested copy of EKG, as we can only see interpretation in Care Everywhere.

## 2015-05-17 ENCOUNTER — Telehealth: Payer: Self-pay | Admitting: *Deleted

## 2015-05-17 NOTE — Telephone Encounter (Signed)
Dr Ether Griffins calling to giving [place] an update and would like patient to come in for an EKG  Scheduled him for 05/23/15 to come in to our office for an ekg (pt is aware of this) Today he was seen by Dr Ether Griffins for bladder infection. He's mentioned he was having some dizzy spells potassium and hypothyroid were both low last time Today his levels were much better, was not able to do EKG wanted to see if we can do it and make sure he is okay. Also stated that the amiodarone may be causing some of the  thyroid issues.  Please advise if we have any concerns

## 2015-05-23 ENCOUNTER — Ambulatory Visit (INDEPENDENT_AMBULATORY_CARE_PROVIDER_SITE_OTHER): Payer: Medicare HMO

## 2015-05-23 VITALS — BP 116/74 | HR 74 | Ht 70.0 in | Wt 167.8 lb

## 2015-05-23 DIAGNOSIS — I4891 Unspecified atrial fibrillation: Secondary | ICD-10-CM

## 2015-05-23 NOTE — Patient Instructions (Signed)
Dr. [name] will review your EKG I will call you w/ any recommendations he may have

## 2015-05-23 NOTE — Progress Notes (Addendum)
1.) Reason for visit: EKG  2.) Name of MD requesting visit: Dr. Cliffton Asters  3.) H&P: Pt states that his PCP sent him here for EKG.  He is unsure why, states that he thinks our EKG machines are "better".  4.) ROS related to problem: Pt reports that he is overall feeling very good.  States that he was recently told that his thyroid levels were too low and his K+ was too high.    5.) Assessment and plan per MD: Pt's EKG shows afib, which pt has not had since April 2016.  Pt is asymptomatic.  He takes Eliquis 5 mg BID and amiodarone 200 mg once daily.  Advised him that Dr. [name] will review his EKG and I will call him w/ any recommendations. He asks that I forward results to his PCP, Dr. Doristine Mango.  Per Dr. [name], pt's last EKGs have been normal, unsure if afib is 2/2 thyroid.  He recommends pt come in for next available ov to discuss options, as pt was previously cardioverted.  Pt sched to see Dr. [name] 06/29/15 @ 10:00.

## 2015-06-22 ENCOUNTER — Telehealth: Payer: Self-pay

## 2015-06-22 NOTE — Telephone Encounter (Signed)
Spoke w/ pt.  He reports that he fell and landed on his face, scratched his nose and arm a bit.  Reports that he got the bleeding to stop, does not have any bruises yet. He did not hit his head. Advised pt that I would be concerned if he had bleeding that wouldn't stop or bruising that is spreading, but he denies.  He is appreciative of the call and states that he will not be getting up wood anymore, will have someone else do this for him.

## 2015-06-22 NOTE — Telephone Encounter (Signed)
Pt states about 30 minutes ago he lost his balance loading wood and fell, and cut his face in 3 place. Stated he did get the bleeding stopped, but his son wanted him to call and make sure there is not a chance he is bleeding internally.

## 2015-06-29 ENCOUNTER — Ambulatory Visit (INDEPENDENT_AMBULATORY_CARE_PROVIDER_SITE_OTHER): Payer: Medicare HMO | Admitting: Cardiovascular Disease

## 2015-06-29 ENCOUNTER — Encounter: Payer: Self-pay | Admitting: Cardiovascular Disease

## 2015-06-29 ENCOUNTER — Telehealth: Payer: Self-pay

## 2015-06-29 VITALS — BP 120/64 | HR 79 | Ht 70.0 in | Wt 171.5 lb

## 2015-06-29 DIAGNOSIS — I5022 Chronic systolic (congestive) heart failure: Secondary | ICD-10-CM | POA: Diagnosis not present

## 2015-06-29 DIAGNOSIS — I4891 Unspecified atrial fibrillation: Secondary | ICD-10-CM | POA: Diagnosis not present

## 2015-06-29 DIAGNOSIS — I420 Dilated cardiomyopathy: Secondary | ICD-10-CM

## 2015-06-29 DIAGNOSIS — I10 Essential (primary) hypertension: Secondary | ICD-10-CM

## 2015-06-29 DIAGNOSIS — I4819 Other persistent atrial fibrillation: Secondary | ICD-10-CM

## 2015-06-29 DIAGNOSIS — J441 Chronic obstructive pulmonary disease with (acute) exacerbation: Secondary | ICD-10-CM

## 2015-06-29 DIAGNOSIS — I481 Persistent atrial fibrillation: Secondary | ICD-10-CM

## 2015-06-29 MED ORDER — APIXABAN 5 MG PO TABS: 5.0000 mg | ORAL_TABLET | Freq: Two times a day (BID) | ORAL | Status: DC

## 2015-06-29 MED ORDER — FUROSEMIDE 20 MG PO TABS: 20.0000 mg | ORAL_TABLET | Freq: Every day | ORAL | Status: DC | PRN

## 2015-06-29 MED ORDER — METOPROLOL SUCCINATE ER 25 MG PO TB24: 25.0000 mg | ORAL_TABLET | Freq: Every day | ORAL | Status: DC

## 2015-06-29 NOTE — Assessment & Plan Note (Signed)
Low ejection fraction concerning for ischemic cardiomyopathy He does not want further workup, has declined stress testing, catheterization in the past Medication changes as above

## 2015-06-29 NOTE — Telephone Encounter (Signed)
Pt states he doesn't need Furosemide, states he has 6 bottles of it. Please call to discuss meds

## 2015-06-29 NOTE — Progress Notes (Signed)
Patient ID: Devin Ramsey, male    DOB: [birth year], 70 y.o.   MRN: [id number]  HPI Comments: Devin Ramsey is a 70 year old gentleman who presented to the hospital 06/2014 with bronchitis symptoms, EKG with new atrial fibrillation, chest x-ray showing pneumonia who was kept in the hospital for 5 days, COPD exacerbation. He had cardioversion 08/23/2014 for A. fib flutter, He follows up today to discuss atrial fibrillation management Previous echocardiogram showed ejection fraction 35-40%, February 2016 in the setting of atrial fibrillation Stop smoking 06/2014, wife still smokes  In follow-up today, it would appear he converted back to atrial fibrillation since his last clinic visit EKG in January 2017 showing atrial fibrillation Prior EKG in October and November 2016 shows normal sinus rhythm with left bundle branch block  He denies any change in his symptoms, no change in breathing, no leg edema Overall feels well with no complaints he has not been taking diltiazem, possibly once in the past year  Stopped smoking one year ago Takes his Lasix 10 mg daily, no potassium Has been taking his amiodarone with anticoagulation twice a day, does not take Viagra/revatio  EKG on today's visit shows atrial fibrillation with ventricular rate 79 bpm, left bundle branch block  Other past medical history  previous  Ejection fraction remains low at 35-40%, no significant improvement by restoring normal sinus rhythm  Lab work from the hospital showed sodium 132, potassium 5.1, hemoglobin A1c 5.3, negative cardiac enzymes, Total cholesterol 113, LDL 56, hematocrit 49, creatinine 1.02, magnesium 2.1  Chest x-ray with interstitial pulmonary edema, small bilateral pleural effusions  Echocardiogram reviewed with him from 06/29/2014 showing ejection fraction 35-40%, septal motion consistent with bundle branch block, normal right ventricular systolic pressure, normal left atrial size   Allergies  Allergen  Reactions  . No Known Allergies     Outpatient Encounter Prescriptions as of 06/29/2015  Medication Sig  . albuterol (PROVENTIL HFA;VENTOLIN HFA) 108 (90 BASE) MCG/ACT inhaler Inhale 2 puffs into the lungs every 6 (six) hours as needed for wheezing or shortness of breath.  Marland Kitchen apixaban (ELIQUIS) 5 MG TABS tablet Take 1 tablet (5 mg total) by mouth 2 (two) times daily.  . furosemide (LASIX) 20 MG tablet Take 1 tablet (20 mg total) by mouth daily as needed.  . [DISCONTINUED] amiodarone (PACERONE) 200 MG tablet Take 1 tablet (200 mg total) by mouth daily.  . [DISCONTINUED] apixaban (ELIQUIS) 5 MG TABS tablet Take 1 tablet (5 mg total) by mouth 2 (two) times daily.  . [DISCONTINUED] CARTIA XT 120 MG 24 hr capsule Take 1 capsule (120 mg total) by mouth daily.  . [DISCONTINUED] furosemide (LASIX) 20 MG tablet Take 20 mg by mouth daily as needed.   . [DISCONTINUED] furosemide (LASIX) 40 MG tablet TAKE ONE TABLET BY MOUTH TWICE DAILY (Patient taking differently: TAKE ONE TABLET BY MOUTH TWICE DAILY AS NEEDED.)  . [DISCONTINUED] POTASSIUM PO Take by mouth daily as needed.  . potassium chloride (K-DUR) 10 MEQ tablet Take 1 tablet (10 mEq total) by mouth daily.  . [DISCONTINUED] sildenafil (REVATIO) 20 MG tablet Take 1 tablet (20 mg total) by mouth 3 (three) times daily as needed. (Patient not taking: Reported on 06/29/2015)   No facility-administered encounter medications on file as of 06/29/2015.    Past Medical History  Diagnosis Date  . Pneumonia 2016  . COPD (chronic obstructive pulmonary disease) (HCC)   . A-fib (HCC)   . Tobacco abuse   . Acute systolic CHF (congestive heart failure) (  HCC)     EF 35% to 40%  . Bilateral pleural effusion     Past Surgical History  Procedure Laterality Date  . Hemorrhoid surgery    . Shoulder surgery      right shoulder     Social History  reports that he quit smoking about a year ago. His smoking use included Cigarettes. He quit after 55 years of use. He  does not have any smokeless tobacco history on file. He reports that he does not drink alcohol or use illicit drugs.  Family History He was adopted. Family history is unknown by patient.  Review of Systems  Constitutional: Negative.   Respiratory: Positive for shortness of breath.   Cardiovascular: Negative.   Gastrointestinal: Negative.   Musculoskeletal: Negative.   Skin:       Significant bruising over his right biceps region  Neurological: Negative.   Hematological: Negative.   Psychiatric/Behavioral: Negative.   All other systems reviewed and are negative.   BP 120/64 mmHg  Pulse 79  Ht 5\' 10"  (1.778 m)  Wt 171 lb 8 oz (77.792 kg)  BMI 24.61 kg/m2  Physical Exam  Constitutional: He is oriented to person, place, and time. He appears well-developed and well-nourished.  HENT:  Head: Normocephalic.  Nose: Nose normal.  Mouth/Throat: Oropharynx is clear and moist.  Eyes: Conjunctivae are normal. Pupils are equal, round, and reactive to light.  Neck: Normal range of motion. Neck supple. No JVD present.  Cardiovascular: Normal rate, regular rhythm, S1 normal, S2 normal, normal heart sounds and intact distal pulses.  Exam reveals no gallop and no friction rub.   No murmur heard. Pulmonary/Chest: Effort normal. No respiratory distress. He has decreased breath sounds. He has no wheezes. He has rales. He exhibits no tenderness.  Abdominal: Soft. Bowel sounds are normal. He exhibits no distension. There is no tenderness.  Musculoskeletal: Normal range of motion. He exhibits no edema or tenderness.  Lymphadenopathy:    He has no cervical adenopathy.  Neurological: He is alert and oriented to person, place, and time. Coordination normal.  Skin: Skin is warm and dry. No rash noted. No erythema.  Psychiatric: He has a normal mood and affect. His behavior is normal. Judgment and thought content normal.      Assessment and Plan   Nursing note and vitals reviewed.

## 2015-06-29 NOTE — Assessment & Plan Note (Signed)
Long history of smoking, he does report baseline chronic shortness of breath. Uses albuterol sparingly

## 2015-06-29 NOTE — Patient Instructions (Addendum)
You are in atrial fibrillation  Please stop the amiodarone  Please start metoprolol succinate one a day  For worsening leg swelling, Take a whole water pill  We will request lipids from From Dr. Cliffton Asters  Please call us if you have new issues that need to be addressed before your next appt.  Your physician wants you to follow-up in: 3 months.  You will receive a reminder letter in the mail two months in advance. If you don't receive a letter, please call our office to schedule the follow-up appointment.

## 2015-06-29 NOTE — Assessment & Plan Note (Signed)
We have started him on metoprolol succinate 25 mg daily. If he has hypotension or bradycardia, could cut the pill in half   Total encounter time more than 25 minutes  Greater than 50% was spent in counseling and coordination of care with the patient

## 2015-06-29 NOTE — Assessment & Plan Note (Signed)
He is not interested in restoring normal sinus rhythm at this time as he feels well with no complaints Recommended a stay on his anticoagulation, we will add metoprolol

## 2015-06-29 NOTE — Assessment & Plan Note (Signed)
Currently not taking diltiazem, takes Lasix 10 mg daily Clinically, appears relatively euvolemic, no significant change in his shortness of breath, no dramatic weight change. Recommended he start metoprolol succinate 25 mg daily If room on his blood pressure in the future, would try to add entresto

## 2015-07-31 ENCOUNTER — Other Ambulatory Visit: Payer: Self-pay | Admitting: Family Medicine

## 2015-07-31 DIAGNOSIS — Z136 Encounter for screening for cardiovascular disorders: Secondary | ICD-10-CM

## 2015-08-01 ENCOUNTER — Telehealth: Payer: Self-pay | Admitting: Cardiovascular Disease

## 2015-08-01 ENCOUNTER — Other Ambulatory Visit: Payer: Self-pay | Admitting: *Deleted

## 2015-08-01 ENCOUNTER — Other Ambulatory Visit: Payer: Self-pay

## 2015-08-01 MED ORDER — APIXABAN 5 MG PO TABS: 5.0000 mg | ORAL_TABLET | Freq: Two times a day (BID) | ORAL | Status: DC

## 2015-08-01 NOTE — Telephone Encounter (Signed)
*  STAT* If patient is at the pharmacy, call can be transferred to refill team.   1. Which medications need to be refilled? (please list name of each medication and dose if known) apixaban (ELIQUIS) 5 MG TABS tablet   2. Which pharmacy/location (including street and city if local pharmacy) is medication to be sent to? [facility]  3. Do they need a 30 day or 90 day supply? 90 day

## 2015-08-01 NOTE — Telephone Encounter (Signed)
Requested Prescriptions   Signed Prescriptions Disp Refills  . apixaban (ELIQUIS) 5 MG TABS tablet 180 tablet 3    Sig: Take 1 tablet (5 mg total) by mouth 2 (two) times daily.    Authorizing Provider: GOLLAN, TIMOTHY J    Ordering User: Katalea Ucci C    

## 2015-08-04 ENCOUNTER — Ambulatory Visit
Admission: RE | Admit: 2015-08-04 | Discharge: 2015-08-04 | Disposition: A | Discharge status: Home / Self Care | Payer: Medicare HMO | Source: Ambulatory Visit | Attending: Family Medicine | Admitting: Family Medicine

## 2015-08-12 ENCOUNTER — Other Ambulatory Visit: Payer: Self-pay | Admitting: Physician Assistant

## 2015-09-13 ENCOUNTER — Telehealth: Payer: Self-pay | Admitting: Cardiovascular Disease

## 2015-09-13 MED ORDER — POTASSIUM CHLORIDE CRYS ER 20 MEQ PO TBCR: 10.0000 meq | EXTENDED_RELEASE_TABLET | Freq: Every day | ORAL | Status: DC

## 2015-09-13 NOTE — Telephone Encounter (Signed)
Refill sent for potassium 20 meq take one half tablet daily #45 tablets with 3 refills.

## 2015-09-13 NOTE — Telephone Encounter (Signed)
*  STAT* If patient is at the pharmacy, call can be transferred to refill team.   1. Which medications need to be refilled? (please list name of each medication and dose if known) potassium chloride (K-DUR) 10 MEQ tablet (Rx is written for 20 meq)   2. Which pharmacy/location (including street and city if local pharmacy) is medication to be sent to? [facility]  3. Do they need a 30 day or 90 day supply? 90 day

## 2015-09-26 ENCOUNTER — Other Ambulatory Visit: Payer: Self-pay | Admitting: *Deleted

## 2015-09-26 ENCOUNTER — Encounter: Payer: Self-pay | Admitting: Cardiovascular Disease

## 2015-09-26 ENCOUNTER — Telehealth: Payer: Self-pay | Admitting: *Deleted

## 2015-09-26 ENCOUNTER — Ambulatory Visit (INDEPENDENT_AMBULATORY_CARE_PROVIDER_SITE_OTHER): Payer: Medicare HMO | Admitting: Cardiovascular Disease

## 2015-09-26 VITALS — BP 120/60 | HR 71 | Ht 70.0 in | Wt 170.2 lb

## 2015-09-26 DIAGNOSIS — I481 Persistent atrial fibrillation: Secondary | ICD-10-CM | POA: Diagnosis not present

## 2015-09-26 DIAGNOSIS — I420 Dilated cardiomyopathy: Secondary | ICD-10-CM | POA: Diagnosis not present

## 2015-09-26 DIAGNOSIS — J441 Chronic obstructive pulmonary disease with (acute) exacerbation: Secondary | ICD-10-CM

## 2015-09-26 DIAGNOSIS — I5022 Chronic systolic (congestive) heart failure: Secondary | ICD-10-CM

## 2015-09-26 DIAGNOSIS — I10 Essential (primary) hypertension: Secondary | ICD-10-CM

## 2015-09-26 DIAGNOSIS — I4819 Other persistent atrial fibrillation: Secondary | ICD-10-CM

## 2015-09-26 MED ORDER — CLOBETASOL PROPIONATE 0.05 % EX CREA: 1.0000 "application " | TOPICAL_CREAM | Freq: Two times a day (BID) | CUTANEOUS | Status: DC | PRN

## 2015-09-26 MED ORDER — SACUBITRIL-VALSARTAN 24-26 MG PO TABS: 1.0000 | ORAL_TABLET | Freq: Two times a day (BID) | ORAL | Status: DC

## 2015-09-26 NOTE — Assessment & Plan Note (Addendum)
As detailed above, we will add entresto at low-dose twice a day Blood pressure is low, we'll need to advance dosing slowly

## 2015-09-26 NOTE — Assessment & Plan Note (Signed)
Reports breathing is stable. Lungs sounds poor, decreased throughout secondary to moderate to severe COPD

## 2015-09-26 NOTE — Assessment & Plan Note (Signed)
Long discussion concerning depressed ejection fraction He does not want additional workup We will add entresto 24/26 mg by mouth twice a day for depressed ejection fraction Recommended that he monitor blood pressure closely, call our office if he has lightheadedness

## 2015-09-26 NOTE — Patient Instructions (Addendum)
You are doing well.  Please start entresto one pill twice a day  Please call us if you have new issues that need to be addressed before your next appt.  Your physician wants you to follow-up in: 3 months.  You will receive a reminder letter in the mail two months in advance. If you don't receive a letter, please call our office to schedule the follow-up appointment.

## 2015-09-26 NOTE — Progress Notes (Signed)
Patient ID: Devin Ramsey, male    DOB: 08/19/1945, 70 y.o.   MRN: [id number]  HPI Comments: [name] is a 70 year old gentleman who presented to the hospital 06/2014 with bronchitis symptoms, EKG with new atrial fibrillation, chest x-ray showing pneumonia who was kept in the hospital for 5 days, COPD exacerbation. He had cardioversion 08/23/2014 for A. fib flutter, He follows up today to discuss atrial fibrillation management Previous echocardiogram showed ejection fraction 35-40%, February 2016 in the setting of atrial fibrillation Stopped smoking 06/2014, wife still smokes  In follow-up today, he reports that he feels well Denies any significant shortness of breath, chest pain Occasionally has some fullness, rare ankle swelling, takes Lasix as needed Denies any recent weight gain, otherwise is active Stopped smoking 16 months ago, has not restarted Discussed doing ischemia workup for ejection fraction 35% on his last clinic visit, was not interested Again today reports that he feels fine, does not see need for testing  Reports he has been compliant with his anticoagulation  EKG on today's visit shows atrial fibrillation with left bundle branch block,  Other past medical history EKG in January 2017 showing atrial fibrillation Prior EKG in October and November 2016 shows normal sinus rhythm with left bundle branch block   previous  Ejection fraction remains low at 35-40%, no significant improvement by restoring normal sinus rhythm  Lab work from the hospital showed sodium 132, potassium 5.1, hemoglobin A1c 5.3, negative cardiac enzymes, Total cholesterol 113, LDL 56, hematocrit 49, creatinine 1.02, magnesium 2.1  Chest x-ray with interstitial pulmonary edema, small bilateral pleural effusions  Echocardiogram  from 06/29/2014 showing ejection fraction 35-40%, septal motion consistent with bundle branch block, normal right ventricular systolic pressure, normal left atrial  size   Allergies  Allergen Reactions  . No Known Allergies       Medication List       This list is accurate as of: 09/26/15 11:44 AM.  Always use your most recent med list.               albuterol 108 (90 Base) MCG/ACT inhaler  Commonly known as:  PROVENTIL HFA;VENTOLIN HFA  Inhale 2 puffs into the lungs every 6 (six) hours as needed for wheezing or shortness of breath.     apixaban 5 MG Tabs tablet  Commonly known as:  ELIQUIS  Take 1 tablet (5 mg total) by mouth 2 (two) times daily.     clobetasol cream 0.05 %  Commonly known as:  TEMOVATE  Apply 1 application topically 2 (two) times daily as needed.     furosemide 20 MG tablet  Commonly known as:  LASIX  Take 1 tablet (20 mg total) by mouth daily as needed.     levothyroxine 25 MCG tablet  Commonly known as:  SYNTHROID, LEVOTHROID  Take 25 mcg by mouth daily before breakfast.     metoprolol succinate 25 MG 24 hr tablet  Commonly known as:  TOPROL XL  Take 1 tablet (25 mg total) by mouth daily.     potassium chloride SA 20 MEQ tablet  Commonly known as:  KLOR-CON M20  Take 0.5 tablets (10 mEq total) by mouth daily.     predniSONE 20 MG tablet  Commonly known as:  DELTASONE  Take 20 mg by mouth daily as needed.     sacubitril-valsartan 24-26 MG  Commonly known as:  ENTRESTO  Take 1 tablet by mouth 2 (two) times daily.  Past Medical History  Diagnosis Date  . Pneumonia 2016  . COPD (chronic obstructive pulmonary disease) (HCC)   . A-fib (HCC)   . Tobacco abuse   . Acute systolic CHF (congestive heart failure) (HCC)     EF 35% to 40%  . Bilateral pleural effusion     Past Surgical History  Procedure Laterality Date  . Hemorrhoid surgery    . Shoulder surgery      right shoulder     Social History  reports that he quit smoking about 15 months ago. His smoking use included Cigarettes. He quit after 55 years of use. He does not have any smokeless tobacco history on file. He reports that he  does not drink alcohol or use illicit drugs.  Family History He was adopted. Family history is unknown by patient.  Review of Systems  Constitutional: Negative.   Respiratory: Positive for shortness of breath.   Cardiovascular: Negative.   Gastrointestinal: Negative.   Musculoskeletal: Negative.   Skin:       Significant bruising over his right biceps region  Neurological: Negative.   Hematological: Negative.   Psychiatric/Behavioral: Negative.   All other systems reviewed and are negative.   BP 120/60 mmHg  Pulse 71  Ht  (1.778 m)  Wt 170 lb 4 oz (77.225 kg)  BMI 24.43 kg/m2  Physical Exam  Constitutional: He is oriented to person, place, and time. He appears well-developed and well-nourished.  HENT:  Head: Normocephalic.  Nose: Nose normal.  Mouth/Throat: Oropharynx is clear and moist.  Eyes: Conjunctivae are normal. Pupils are equal, round, and reactive to light.  Neck: Normal range of motion. Neck supple. No JVD present.  Cardiovascular: Normal rate, S1 normal, S2 normal, normal heart sounds and intact distal pulses.  An irregularly irregular rhythm present. Exam reveals no gallop and no friction rub.   No murmur heard. Pulmonary/Chest: Effort normal. No respiratory distress. He has decreased breath sounds. He has no wheezes. He has rales. He exhibits no tenderness.  Abdominal: Soft. Bowel sounds are normal. He exhibits no distension. There is no tenderness.  Musculoskeletal: Normal range of motion. He exhibits no edema or tenderness.  Lymphadenopathy:    He has no cervical adenopathy.  Neurological: He is alert and oriented to person, place, and time. Coordination normal.  Skin: Skin is warm and dry. No rash noted. No erythema.  Psychiatric: He has a normal mood and affect. His behavior is normal. Judgment and thought content normal.      Assessment and Plan   Nursing note and vitals reviewed.

## 2015-09-26 NOTE — Assessment & Plan Note (Signed)
Appears relatively euvolemic on today's visit We will start  entresto as above

## 2015-09-26 NOTE — Telephone Encounter (Signed)
PA required for Entresto 24-26 mg. PA has been submitted awaiting approval.

## 2015-09-28 ENCOUNTER — Telehealth: Payer: Self-pay

## 2015-09-28 NOTE — Telephone Encounter (Signed)
Entresto 24 mg-26 mg is approved through 09/26/2017.

## 2015-09-28 NOTE — Telephone Encounter (Signed)
Notified the pharmacy and patient the Sherryll Burger has been approved.

## 2015-10-09 ENCOUNTER — Telehealth: Payer: Self-pay | Admitting: Family Medicine

## 2015-10-09 NOTE — Telephone Encounter (Signed)
Pt would like to establish with you, but can only do a morning appt.  He was recommended to you by Dr [name].  His current pcp is no longer taking his insurance. Is it ok to put him in a 30 min slot or wait until the next new pt opening?  cb number is [phone] or [phone] Thank you

## 2015-10-10 ENCOUNTER — Telehealth: Payer: Self-pay | Admitting: Cardiovascular Disease

## 2015-10-10 NOTE — Telephone Encounter (Signed)
Patient says Web md says Do not mix K+ , salt, alcohol with Entresto.  Patient Just started Sherryll Burger this month and wants to make sure he doesn't end up " horizontal forever"  Patient wants to check all medications against the entresto to make sure there is no interaction. Please call patient to discuss.    C/o interim Diarrhea dizziness .

## 2015-10-10 NOTE — Telephone Encounter (Signed)
Spoke w/ pt.  Advised him that anytime we send in an rx, we will get a warning if there is an interaction and have to override.  Discussed w/ him the purpose of his K+ pill that the he is to take this to offset K+ loss w/ his lasix.  He verbalizes understanding and will call back w/ any further questions or concerns.

## 2015-10-10 NOTE — Telephone Encounter (Signed)
30 min slot when possible for patient. Thanks.

## 2015-11-17 ENCOUNTER — Encounter: Payer: Self-pay | Admitting: Family Medicine

## 2015-11-17 ENCOUNTER — Ambulatory Visit (INDEPENDENT_AMBULATORY_CARE_PROVIDER_SITE_OTHER): Payer: Medicare HMO | Admitting: Family Medicine

## 2015-11-17 VITALS — BP 102/52 | HR 76 | Temp 98.5°F | Ht 70.0 in | Wt 172.8 lb

## 2015-11-17 DIAGNOSIS — E039 Hypothyroidism, unspecified: Secondary | ICD-10-CM

## 2015-11-17 DIAGNOSIS — Z7189 Other specified counseling: Secondary | ICD-10-CM | POA: Diagnosis not present

## 2015-11-17 DIAGNOSIS — I481 Persistent atrial fibrillation: Secondary | ICD-10-CM

## 2015-11-17 DIAGNOSIS — I4819 Other persistent atrial fibrillation: Secondary | ICD-10-CM

## 2015-11-17 LAB — TSH: TSH: 35.56 u[IU]/mL — AB (ref 0.35–4.50)

## 2015-11-17 NOTE — Patient Instructions (Signed)
Happy birthday.  Take care.  Glad to see you.  Go to the lab on the way out.  We'll contact you with your lab report. Don't change your meds for now.  I'll check your old records and we'll go from there.

## 2015-11-17 NOTE — Progress Notes (Signed)
Pre visit review using our clinic review tool, if applicable. No additional management support is needed unless otherwise documented below in the visit note.  New patient.  To est care.    Hypothyroidism.  Due for f/u TSH.  Compliant.  No ADE on med.  TSH pending, see notes on labs.  Will review old PCP noted.    AF.   H/o low EF, didn't want extra treatment.  Compliant with meds.  No ADE on med.  No CP.  Feels well.  He isn't smoking.    PMH and SH reviewed  ROS: Per HPI unless specifically indicated in ROS section   Meds, vitals, and allergies reviewed.   GEN: nad, alert and oriented HEENT: mucous membranes moist NECK: supple w/o LA CV: IRR.  no murmur apprecaited.   PULM: ctab, no inc wob ABD: soft, +bs EXT: trace BLE edema SKIN: no acute rash

## 2015-11-19 ENCOUNTER — Other Ambulatory Visit: Payer: Self-pay | Admitting: Family Medicine

## 2015-11-19 ENCOUNTER — Encounter: Payer: Self-pay | Admitting: Family Medicine

## 2015-11-19 DIAGNOSIS — Z7189 Other specified counseling: Secondary | ICD-10-CM | POA: Insufficient documentation

## 2015-11-19 DIAGNOSIS — E039 Hypothyroidism, unspecified: Secondary | ICD-10-CM | POA: Insufficient documentation

## 2015-11-19 MED ORDER — LEVOTHYROXINE SODIUM 25 MCG PO TABS: 50.0000 ug | ORAL_TABLET | Freq: Every day | ORAL | Status: DC

## 2015-11-19 NOTE — Assessment & Plan Note (Signed)
No change in meds.  Minimal edema, compliant.  He doesn't have complaints.  Will review old cards notes.

## 2015-11-19 NOTE — Assessment & Plan Note (Signed)
No tmg on exam, see notes on labs. >30 minutes spent in face to face time with patient, >50% spent in counselling or coordination of care.

## 2015-11-19 NOTE — Assessment & Plan Note (Signed)
Wife designated if patient were incapacitated.  D/w pt 11/17/15.

## 2015-12-01 ENCOUNTER — Other Ambulatory Visit: Payer: Self-pay

## 2015-12-01 MED ORDER — CLOBETASOL PROPIONATE 0.05 % EX CREA: 1.0000 "application " | TOPICAL_CREAM | Freq: Two times a day (BID) | CUTANEOUS | Status: DC | PRN

## 2015-12-01 NOTE — Telephone Encounter (Signed)
Sent. Thanks.   

## 2015-12-01 NOTE — Telephone Encounter (Signed)
Pt left v/m; pt established care on 11/17/15; pt request refill clobetasol cream to [facility] [address]. Please advise.

## 2015-12-04 ENCOUNTER — Other Ambulatory Visit: Payer: Self-pay | Admitting: *Deleted

## 2015-12-04 ENCOUNTER — Telehealth: Payer: Self-pay | Admitting: Cardiovascular Disease

## 2015-12-04 MED ORDER — APIXABAN 5 MG PO TABS: 5.0000 mg | ORAL_TABLET | Freq: Two times a day (BID) | ORAL | Status: DC

## 2015-12-04 NOTE — Telephone Encounter (Signed)
Requested Prescriptions   Signed Prescriptions Disp Refills  . apixaban (ELIQUIS) 5 MG TABS tablet 180 tablet 3    Sig: Take 1 tablet (5 mg total) by mouth 2 (two) times daily.    Authorizing Provider: GOLLAN, TIMOTHY J    Ordering User: LOPEZ, MARINA C    

## 2015-12-04 NOTE — Telephone Encounter (Signed)
°*  STAT* If patient is at the pharmacy, call can be transferred to refill team.   1. Which medications need to be refilled? (please list name of each medication and dose if known)  Eliquis 5 mg po bid   2. Which pharmacy/location (including street and city if local pharmacy) is medication to be sent to?  [facility] [address] [place]   3. Do they need a 30 day or 90 day supply? 90

## 2015-12-04 NOTE — Telephone Encounter (Signed)
Requested Prescriptions   Signed Prescriptions Disp Refills  . apixaban (ELIQUIS) 5 MG TABS tablet 180 tablet 3    Sig: Take 1 tablet (5 mg total) by mouth 2 (two) times daily.    Authorizing Provider: GOLLAN, TIMOTHY J    Ordering User: Leone Putman C    

## 2015-12-06 NOTE — Telephone Encounter (Signed)
Pt request status of clobetasol refill; advised refill done on 12/01/15 and pt will ck with pharmacy.

## 2015-12-12 ENCOUNTER — Telehealth: Payer: Self-pay | Admitting: Cardiovascular Disease

## 2015-12-12 NOTE — Telephone Encounter (Signed)
Patient wants to know if we can write a note for his spouse to be dismissed from jury duty.  Patient stated that Metoprolol and Entresto 2 x daily and this medication makes him dizzy .  He says he doesn't want to be home alone and is unable to function without her.    Please call to discuss what we can do to help.

## 2015-12-13 NOTE — Telephone Encounter (Signed)
Patient dropped off summons for jury duty to be completed.  Please call patient when ready .

## 2015-12-13 NOTE — Telephone Encounter (Signed)
Spoke w/ pt and his wife.  Discussed w/ both of them the several reasons why cannot write a letter for her (she is not our pt, I don't know if she meets any of the listed criteria, we have no documentation that pt is dizzy and if so, we will need to adjust his meds, etc).   She states that she stays at home and watches him all day b/c he is so dizzy.   He does not comment on this, states that he will call pt's PCP to discuss.  He does not need paperwork back, as he has 2 more copies.  Asked him to call back if we can be of further assistance.

## 2015-12-13 NOTE — Telephone Encounter (Signed)
If he is dizzy, we need to adjust his medications If she needs to be excused for a disability, would recommend she talk with her primary care physician

## 2016-01-04 ENCOUNTER — Ambulatory Visit (INDEPENDENT_AMBULATORY_CARE_PROVIDER_SITE_OTHER): Payer: Medicare HMO | Admitting: Cardiovascular Disease

## 2016-01-04 ENCOUNTER — Encounter: Payer: Self-pay | Admitting: Cardiovascular Disease

## 2016-01-04 VITALS — BP 90/62 | HR 68 | Ht 70.0 in | Wt 168.5 lb

## 2016-01-04 DIAGNOSIS — I481 Persistent atrial fibrillation: Secondary | ICD-10-CM

## 2016-01-04 DIAGNOSIS — I4819 Other persistent atrial fibrillation: Secondary | ICD-10-CM

## 2016-01-04 DIAGNOSIS — E785 Hyperlipidemia, unspecified: Secondary | ICD-10-CM | POA: Insufficient documentation

## 2016-01-04 DIAGNOSIS — I5022 Chronic systolic (congestive) heart failure: Secondary | ICD-10-CM | POA: Diagnosis not present

## 2016-01-04 DIAGNOSIS — I255 Ischemic cardiomyopathy: Secondary | ICD-10-CM

## 2016-01-04 DIAGNOSIS — J441 Chronic obstructive pulmonary disease with (acute) exacerbation: Secondary | ICD-10-CM

## 2016-01-04 DIAGNOSIS — I10 Essential (primary) hypertension: Secondary | ICD-10-CM | POA: Diagnosis not present

## 2016-01-04 DIAGNOSIS — M1712 Unilateral primary osteoarthritis, left knee: Secondary | ICD-10-CM

## 2016-01-04 MED ORDER — ROSUVASTATIN CALCIUM 10 MG PO TABS: 10.0000 mg | ORAL_TABLET | Freq: Every day | ORAL | 11 refills | Status: DC

## 2016-01-04 NOTE — Patient Instructions (Addendum)
Medication Instructions:   Please start crestor one pill a day for cholesterol   Labwork:  We will check a BMP today  Testing/Procedures:  No testing today  Follow-Up: It was a pleasure seeing you in the office today. Please call us if you have new issues that need to be addressed before your next appt.  [phone]  Your physician wants you to follow-up in: 6 months.  You will receive a reminder letter in the mail two months in advance. If you don't receive a letter, please call our office to schedule the follow-up appointment.  If you need a refill on your cardiac medications before your next appointment, please call your pharmacy.

## 2016-01-04 NOTE — Progress Notes (Signed)
Cardiology Office Note  Date:  01/04/2016   ID:  Devin Ramsey, Devin Ramsey [birth year], MRN [id number]  PCP:  Crawford Givens, MD   Chief Complaint  Patient presents with  . Other    3 month follow up. "doing well."     HPI:  Mr. Shafiq is a 70 year old gentleman who presented to the hospital 06/2014 with bronchitis symptoms, EKG with new atrial fibrillation, chest x-ray showing pneumonia who was kept in the hospital for 5 days, COPD exacerbation. He had cardioversion 08/23/2014 for A. fib flutter, He follows up today to discuss atrial fibrillation management Previous echocardiogram showed ejection fraction 35-40%, February 2016 in the setting of atrial fibrillation Stopped smoking 06/2014, wife still smokes  In follow-up, he reports that his biggest issue is his chronicLeft knee pain, severe Requesting pain medication. Reports he has tried Percocet in the past, seemed to work well Previous had cortisone shots on his shoulders, He does have some shoulder pain but not too bad Problems with sleep secondary to pain in knee, left  No orthostasis on a regular basis, only if he take metoprolol and entresto together  Repeat discussion concerning depressed ejection fraction, likely underlying ischemia/coronary artery disease Does not want cath, only if absolutely needed Again today reports that he feels fine, does not see need for testing Stopped smoking  1 1/2 year ago   ejection fraction 35% on his last clinic visit, was not interested Reports he has been compliant with his anticoagulation  EKG on today's visit shows atrial fibrillation with left bundle branch block,rate 68 bpm  Other past medical history EKG in January 2017 showing atrial fibrillation Prior EKG in October and November 2016 shows normal sinus rhythm with left bundle branch block  Total chol 216, LDL 126   previous  Ejection fraction remains low at 35-40%, no significant improvement by restoring normal sinus rhythm  Lab  work from the hospital showed sodium 132, potassium 5.1, hemoglobin A1c 5.3, negative cardiac enzymes, Total cholesterol   Chest x-ray with interstitial pulmonary edema, small bilateral pleural effusions  Echocardiogram  from 06/29/2014 showing ejection fraction 35-40%, septal motion consistent with bundle branch block, normal right ventricular systolic pressure, normal left atrial size  PMH:   has a past medical history of A-fib (HCC); Acute systolic CHF (congestive heart failure) (HCC); Bilateral pleural effusion; COPD (chronic obstructive pulmonary disease) (HCC); Gout; Pneumonia (2016); Thyroid disease; and Tobacco abuse.  PSH:    Past Surgical History:  Procedure Laterality Date  . HEMORRHOID SURGERY    . SHOULDER SURGERY     right shoulder   . TONSILLECTOMY      Current Outpatient Prescriptions  Medication Sig Dispense Refill  . apixaban (ELIQUIS) 5 MG TABS tablet Take 1 tablet (5 mg total) by mouth 2 (two) times daily. 180 tablet 3  . clobetasol cream (TEMOVATE) 0.05 % Apply 1 application topically 2 (two) times daily as needed. 45 g 1  . furosemide (LASIX) 20 MG tablet Take 1 tablet (20 mg total) by mouth daily as needed. 90 tablet 3  . levothyroxine (SYNTHROID, LEVOTHROID) 25 MCG tablet Take 2 tablets (50 mcg total) by mouth daily before breakfast.    . metoprolol succinate (TOPROL XL) 25 MG 24 hr tablet Take 1 tablet (25 mg total) by mouth daily. 90 tablet 3  . potassium chloride SA (KLOR-CON M20) 20 MEQ tablet Take 0.5 tablets (10 mEq total) by mouth daily. 45 tablet 3  . predniSONE (DELTASONE) 20 MG tablet Take 20 mg by  mouth daily as needed.    . sacubitril-valsartan (ENTRESTO) 24-26 MG Take 1 tablet by mouth 2 (two) times daily. 60 tablet 6  . rosuvastatin (CRESTOR) 10 MG tablet Take 1 tablet (10 mg total) by mouth daily. 30 tablet 11   No current facility-administered medications for this visit.      Allergies:   No known allergies   Social History:  The patient   reports that he quit smoking about 18 months ago. His smoking use included Cigarettes. He quit after 55.00 years of use. He has never used smokeless tobacco. He reports that he drinks alcohol. He reports that he does not use drugs.   Family History:   He was adopted. Family history is unknown by patient.    Review of Systems: Review of Systems  Constitutional: Negative.   Respiratory: Positive for shortness of breath.   Cardiovascular: Negative.   Gastrointestinal: Negative.   Musculoskeletal: Positive for joint pain.  Neurological: Negative.   Psychiatric/Behavioral: Negative.   All other systems reviewed and are negative.    PHYSICAL EXAM: VS:  BP 90/62 (BP Location: Left Arm, Patient Position: Sitting, Cuff Size: Normal)   Pulse 68   Ht 5\' 10"  (1.778 m)   Wt 168 lb 8 oz (76.4 kg)   BMI 24.18 kg/m  , BMI Body mass index is 24.18 kg/m. GEN: Well nourished, well developed, in no acute distress  HEENT: normal  Neck: no JVD, carotid bruits, or masses Cardiac: RRR; no murmurs, rubs, or gallops,no edema  Respiratory:  moderately decreased breath sounds throughout, normal work of breathing GI: soft, nontender, nondistended, + BS MS: no deformity or atrophy  Skin: warm and dry, no rash Neuro:  Strength and sensation are intact Psych: euthymic mood, full affect    Recent Labs: 11/17/2015: TSH 35.56    Lipid Panel No results found for: CHOL, HDL, LDLCALC, TRIG    Wt Readings from Last 3 Encounters:  01/04/16 168 lb 8 oz (76.4 kg)  11/17/15 172 lb 12 oz (78.4 kg)  09/26/15 170 lb 4 oz (77.2 kg)       ASSESSMENT AND PLAN:  Persistent atrial fibrillation (HCC) - Plan: EKG 12-Lead, Basic Metabolic Panel (BMET) Rate-controlled atrial fibrillation/flutter, tolerating anticoagulation  Essential hypertension - Plan: EKG 12-Lead, Basic Metabolic Panel (BMET) Low blood pressure, asymptomatic No medication changes made  Chronic systolic CHF (congestive heart failure)  (HCC) He is not interested in ischemia workup for his cardiomyopathy Long discussion, only if he has to  appears relatively euvolemic  COPD with acute exacerbation (HCC) Stopped smoking over one year ago, Chronic shortness of breath on exertion  Primary osteoarthritis of left knee Requesting pain medications for his left knee Recommended he talk with Dr. Para Marchuncan, may need cortisone shot  Hyperlipidemia Recommended he start Crestor 10 mg daily, goal LDL less than 70  Cardiomyopathy As detailed above, he is not interested in ischemia workup   Total encounter time more than 25 minutes  Greater than 50% was spent in counseling and coordination of care with the patient   Disposition:   F/U  6 months   Orders Placed This Encounter  Procedures  . Basic Metabolic Panel (BMET)  . EKG 12-Lead     Signed, Dossie Arbourim Nuriyah Hanline, M.D., Ph.D. 01/04/2016  [facility], [place] [phone]

## 2016-01-05 LAB — BASIC METABOLIC PANEL
BUN / CREAT RATIO: 5 — AB (ref 10–24)
BUN: 5 mg/dL — ABNORMAL LOW (ref 8–27)
CO2: 23 mmol/L (ref 18–29)
CREATININE: 1 mg/dL (ref 0.76–1.27)
Calcium: 8.5 mg/dL — ABNORMAL LOW (ref 8.6–10.2)
Chloride: 99 mmol/L (ref 96–106)
GFR calc Af Amer: 87 mL/min/{1.73_m2} (ref >59)
GFR calc non Af Amer: 75 mL/min/{1.73_m2} (ref >59)
GLUCOSE: 104 mg/dL — AB (ref 65–99)
POTASSIUM: 3.9 mmol/L (ref 3.5–5.2)
SODIUM: 139 mmol/L (ref 134–144)

## 2016-01-08 ENCOUNTER — Ambulatory Visit (INDEPENDENT_AMBULATORY_CARE_PROVIDER_SITE_OTHER): Payer: Medicare HMO | Admitting: Family Medicine

## 2016-01-08 ENCOUNTER — Encounter: Payer: Self-pay | Admitting: Family Medicine

## 2016-01-08 VITALS — BP 102/58 | HR 68 | Temp 98.5°F | Wt 170.2 lb

## 2016-01-08 DIAGNOSIS — E039 Hypothyroidism, unspecified: Secondary | ICD-10-CM

## 2016-01-08 DIAGNOSIS — M25569 Pain in unspecified knee: Secondary | ICD-10-CM | POA: Diagnosis not present

## 2016-01-08 LAB — TSH: TSH: 7.73 u[IU]/mL — ABNORMAL HIGH (ref 0.35–4.50)

## 2016-01-08 MED ORDER — TRAMADOL HCL 50 MG PO TABS: 100.0000 mg | ORAL_TABLET | Freq: Two times a day (BID) | ORAL | 0 refills | Status: DC | PRN

## 2016-01-08 NOTE — Addendum Note (Signed)
Addended by: Josph Macho A on: 01/08/2016 10:22 AM   Modules accepted: Orders

## 2016-01-08 NOTE — Progress Notes (Signed)
Knee pain.  Had seen cards recently.  Labs d/w pt.  Cr okay, stable.  B knee pain.    He had knee injury back in 1980s, when he fell.  Was seen at hospital and braced at the time.  More pain in the last year, worse in the last 2 months.  Had taken pain meds prev but not recently.  occ R knee pain, but the L knee is clearly worse for patient.  No locking or sticking.  Aches with weather changes.  Still anticoagulated. D/w pt. He had L knee injected prev w/o a lot of relief.  He had tried tramadol recently w/o relief, the pain was still miserable but was only taking 50mg  bid.    Due for f/u TSH.   On synthroid daily.    Meds, vitals, and allergies reviewed.   ROS: Per HPI unless specifically indicated in ROS section   nad No tmg rrr ctab Ext w/o edema L knee with chronic changes on exam, not red or puffy.  Medial joint line ttp  Obvious crepitus on ROM No locking ACL MCL LCL feel solid.  Walking with limp from pain.

## 2016-01-08 NOTE — Patient Instructions (Addendum)
Go to the lab on the way out.  We'll contact you with your lab report (TSH). Try the tramadol and update me as needed.  Take care.  Glad to see you.

## 2016-01-08 NOTE — Assessment & Plan Note (Signed)
Try tramadol 100mg  BID.  If not better we can rx oxycodone for prn use . He is miserable from the pain.  He has obvious finding of OA on exam.  No illicit use.  No need for xray today.  I don't want to inject his knee with prev injection failure and current anticoagulation.  He agrees.  Update me as needed.

## 2016-01-08 NOTE — Progress Notes (Signed)
Pre visit review using our clinic review tool, if applicable. No additional management support is needed unless otherwise documented below in the visit note. 

## 2016-01-08 NOTE — Assessment & Plan Note (Signed)
Recheck TSH pending.  D/w pt.

## 2016-01-15 IMAGING — CR DG CHEST 1V PORT
1 series · 1 of 1 positions shown · non-contrast
Comparison: None.

CLINICAL DATA: Increasing shortness of breath for 2 days, history
of smoking

EXAM:
PORTABLE CHEST - 1 VIEW

[ap]
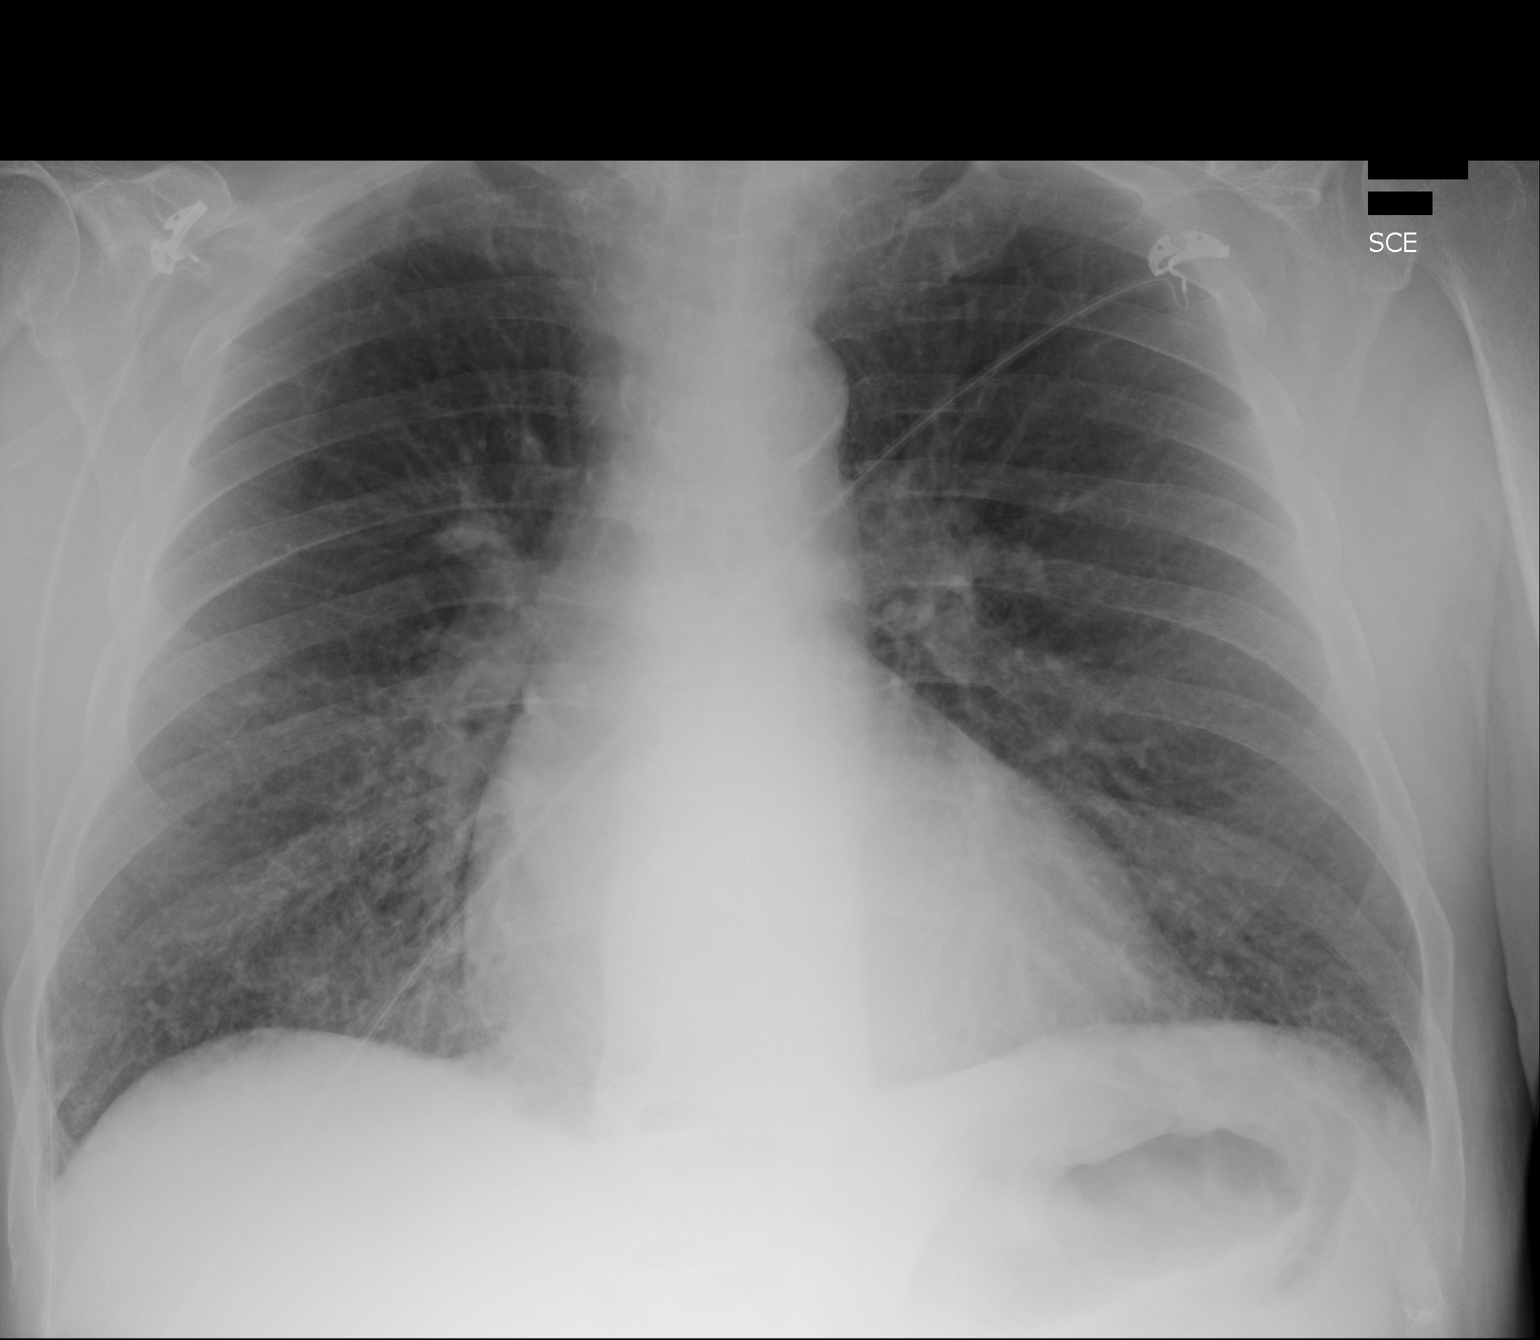

[1 of 1 positions shown; findings below may reference images not displayed]

FINDINGS: Cardiac shadow is within normal limits. The lungs are well aerated
bilaterally. Increased density is noted in the medial right lung
base likely rib within the right lower lobe consistent with early
infiltrate.
IMPRESSION: Increased density within the medial right lung base consistent with
acute infiltrate. Followup films following appropriate therapy
recommended.

## 2016-01-30 ENCOUNTER — Other Ambulatory Visit: Payer: Medicare HMO

## 2016-02-01 ENCOUNTER — Other Ambulatory Visit: Payer: Self-pay | Admitting: *Deleted

## 2016-02-01 ENCOUNTER — Ambulatory Visit (INDEPENDENT_AMBULATORY_CARE_PROVIDER_SITE_OTHER): Payer: Medicare HMO | Admitting: Family Medicine

## 2016-02-01 ENCOUNTER — Encounter: Payer: Self-pay | Admitting: Family Medicine

## 2016-02-01 DIAGNOSIS — M25511 Pain in right shoulder: Secondary | ICD-10-CM | POA: Diagnosis not present

## 2016-02-01 DIAGNOSIS — E039 Hypothyroidism, unspecified: Secondary | ICD-10-CM

## 2016-02-01 DIAGNOSIS — M79621 Pain in right upper arm: Secondary | ICD-10-CM

## 2016-02-01 MED ORDER — HYDROCODONE-ACETAMINOPHEN 5-325 MG PO TABS: 1.0000 | ORAL_TABLET | Freq: Four times a day (QID) | ORAL | 0 refills | Status: DC | PRN

## 2016-02-01 MED ORDER — LEVOTHYROXINE SODIUM 25 MCG PO TABS: 50.0000 ug | ORAL_TABLET | Freq: Every day | ORAL | 5 refills | Status: DC

## 2016-02-01 MED ORDER — CYCLOBENZAPRINE HCL 10 MG PO TABS: 5.0000 mg | ORAL_TABLET | Freq: Three times a day (TID) | ORAL | 0 refills | Status: DC | PRN

## 2016-02-01 NOTE — Progress Notes (Signed)
R armpit pain.  Not better with tramadol.  Started a few days ago.  No FCNAVD.  H/o similar in the past.  Painful spot but no boil.  No trauma.  He thought it was a pulled muscle.  He loaded a big tank recently.    Tramadol helps knee pain.  No ADE on med but not helping R axillary pain.  Meds, vitals, and allergies reviewed.   ROS: Per HPI unless specifically indicated in ROS section   nad ncat Neck supple, no LA rrr ctab abd soft R arm with normal shoulder ROM but pain along the anterior portion of the axilla, no skin redness or bruising, no fluctuant mass.  ttp locally.

## 2016-02-01 NOTE — Patient Instructions (Signed)
Muscle relaxer- cyclobenzaprine.  Pain medicine- hydrocodone.  Sedation caution on both.  Stop tramadol in the meantime.  Take care.  Glad to see you.

## 2016-02-01 NOTE — Progress Notes (Signed)
Pre visit review using our clinic review tool, if applicable. No additional management support is needed unless otherwise documented below in the visit note. 

## 2016-02-02 DIAGNOSIS — M79629 Pain in unspecified upper arm: Secondary | ICD-10-CM | POA: Insufficient documentation

## 2016-02-02 NOTE — Assessment & Plan Note (Signed)
Presumed muscle strain, d/w pt.  Can try flexeril, if pain not controlled can try vicodin.  Routine cautions, sedation esp, with both meds.  Likely not to be an intrathoracic issue.  D/w pt.  He agrees.  Imaging likely not going to change mgmt.  He agrees.  F/u prn.

## 2016-02-12 ENCOUNTER — Other Ambulatory Visit: Payer: Self-pay

## 2016-02-12 NOTE — Telephone Encounter (Signed)
Pt left note requesting rx hydrocodone apap. Call when ready for pick up. Pt last seen and rx last printed # 25 on 02/01/16. Pt also request med to help pt sleep; pt only gets about 2 hours of sleep at a time. Pt request cb. [facility] [address].

## 2016-02-13 ENCOUNTER — Telehealth: Payer: Self-pay | Admitting: Family Medicine

## 2016-02-13 ENCOUNTER — Other Ambulatory Visit: Payer: Self-pay | Admitting: *Deleted

## 2016-02-13 MED ORDER — CYCLOBENZAPRINE HCL 10 MG PO TABS: 5.0000 mg | ORAL_TABLET | Freq: Three times a day (TID) | ORAL | 1 refills | Status: DC | PRN

## 2016-02-13 MED ORDER — HYDROCODONE-ACETAMINOPHEN 5-325 MG PO TABS: 1.0000 | ORAL_TABLET | Freq: Four times a day (QID) | ORAL | 0 refills | Status: DC | PRN

## 2016-02-13 NOTE — Telephone Encounter (Signed)
Sent. Thanks.   

## 2016-02-13 NOTE — Telephone Encounter (Signed)
I would try adding on melatonin in the meantime.  He can get that OTC.  He can take it before bed.  See if that helps some.  That is likely the safest med to start with for now.  If not effective then let me know.  Thanks.

## 2016-02-13 NOTE — Telephone Encounter (Signed)
Patient says he is able to go to sleep fine but then wakes up around 1:30 am or so and can't go back to sleep but feels that his body needs more rest.  Patient says this has been going on for 4 or 5 years.  Patient is not waking up in pain, just can't go back to sleep.   Patient advised. Rx left at front desk for pick up.

## 2016-02-13 NOTE — Telephone Encounter (Signed)
Patient advised.

## 2016-02-13 NOTE — Telephone Encounter (Signed)
Printed.  I need to know what is keeping him from sleeping.  I can't rx with some details.   How long has that been going on? Is it just from the pain? Let me know.  Thanks.

## 2016-02-13 NOTE — Addendum Note (Signed)
Addended by: [name] on: 02/13/2016 01:58 PM   Modules accepted: Orders

## 2016-02-13 NOTE — Telephone Encounter (Signed)
Pt came in today needing to get a refill on  cyclobenzaprine hcl tab 36m Take one-half to one tablet by mouth three times daily as needed for muscle spasms   [facility] [address]

## 2016-02-13 NOTE — Telephone Encounter (Signed)
Patient requests refill of Cyclobenzaprine.  Last Filled:    30 tablet 0 02/01/2016  Please advise.

## 2016-02-21 ENCOUNTER — Telehealth: Payer: Self-pay | Admitting: Family Medicine

## 2016-02-21 MED ORDER — HYDROCODONE-ACETAMINOPHEN 5-325 MG PO TABS: 1.0000 | ORAL_TABLET | Freq: Four times a day (QID) | ORAL | 0 refills | Status: DC | PRN

## 2016-02-21 NOTE — Telephone Encounter (Signed)
Last refill 02/13/16 #25 Called patient and advised him that he just got a refill recently. Patient stated that he knows that, but took the last one last night. Patient stated that the script said that he could take it every 6 hours and he has been taking some times 6 a day and that helped.

## 2016-02-21 NOTE — Telephone Encounter (Signed)
6 a day is within rx'd dosing.  Printed.  Try to wean down on med as pain allows.  If not getting better soon, then I need to recheck him.  Thanks.

## 2016-02-21 NOTE — Telephone Encounter (Signed)
Pt came in today checking on his refill on hydrocodone Please advise pt when ready Cell [phone]

## 2016-02-21 NOTE — Telephone Encounter (Signed)
Patient notified as instructed by telephone and verbalized understanding.  Advised patient that written script is up front ready for pickup. 

## 2016-03-07 ENCOUNTER — Ambulatory Visit (INDEPENDENT_AMBULATORY_CARE_PROVIDER_SITE_OTHER): Payer: Medicare HMO | Admitting: Family Medicine

## 2016-03-07 ENCOUNTER — Encounter: Payer: Self-pay | Admitting: Family Medicine

## 2016-03-07 VITALS — BP 104/58 | HR 60 | Temp 97.7°F | Wt 174.8 lb

## 2016-03-07 DIAGNOSIS — Z23 Encounter for immunization: Secondary | ICD-10-CM | POA: Diagnosis not present

## 2016-03-07 DIAGNOSIS — M7022 Olecranon bursitis, left elbow: Secondary | ICD-10-CM | POA: Diagnosis not present

## 2016-03-07 DIAGNOSIS — M7020 Olecranon bursitis, unspecified elbow: Secondary | ICD-10-CM | POA: Insufficient documentation

## 2016-03-07 NOTE — Assessment & Plan Note (Signed)
Doesn't appear infected, should resolve.  Take pred 1 tab a day until resolved, then 1/2 tab a day for 4 days.  Routine cautions d/w pt.  He agrees.  Update me as needed.

## 2016-03-07 NOTE — Progress Notes (Signed)
L elbow swelling.  Started about 3 days ago, some pain at the olecranon.  He had been propping up his elbow on the table, putting pressure on the olecranon.  Painful initially.  Not red.  It felt like a bag of jello.  On prednisone in the meantime, just restarted it.  D/w pt about steroid cautions, esp re: fluid retention.  This didn't feel exactly like gout.  No other joint involvement.  He is improved since starting prednisone.   Prev R axillary pain is resolved.    Meds, vitals, and allergies reviewed.   ROS: Per HPI unless specifically indicated in ROS section   nad ncat rrr ctab Ext w/o edema L elbow not ttp, normal ROM but mild residual olecranon swelling noted c/w bursitis.  Doesn't appear infected.

## 2016-03-07 NOTE — Patient Instructions (Signed)
Use the prednisone until this is resolved- 1 tab a day.  Then take a half tab for the next 4 days thereafter.  Take care.  Glad to see you.  Update [place] as needed.  If this isn't better and gone by the end of next week, then let me know.

## 2016-03-07 NOTE — Progress Notes (Signed)
Pre visit review using our clinic review tool, if applicable. No additional management support is needed unless otherwise documented below in the visit note. 

## 2016-03-15 ENCOUNTER — Other Ambulatory Visit: Payer: Self-pay

## 2016-03-15 NOTE — Telephone Encounter (Signed)
Pt left note requesting refill tramadol to [facility] [address] for knee pain. Pt has 2 pills left. Last refilled # 120 on 01/08/16. Pt established care on 11/17/15; pt last seen 03/07/16.

## 2016-03-17 MED ORDER — TRAMADOL HCL 50 MG PO TABS: 100.0000 mg | ORAL_TABLET | Freq: Two times a day (BID) | ORAL | 1 refills | Status: DC | PRN

## 2016-03-17 NOTE — Telephone Encounter (Signed)
I didn't get to this at the end of clinic.  With more lead time I would have asked for this to be sent in on Friday.  Please call in.  Thanks.

## 2016-03-18 NOTE — Telephone Encounter (Signed)
Medication phoned to pharmacy.  

## 2016-03-18 NOTE — Telephone Encounter (Signed)
Patient came to office to check on refill. Patient advised as instructed and verbalized understanding.

## 2016-04-04 ENCOUNTER — Telehealth: Payer: Self-pay | Admitting: *Deleted

## 2016-04-04 MED ORDER — PREDNISONE 20 MG PO TABS: 20.0000 mg | ORAL_TABLET | Freq: Every day | ORAL | 1 refills | Status: DC | PRN

## 2016-04-04 NOTE — Telephone Encounter (Signed)
Pt left note at Triage. It says "pt is requesting prednisone 20 mg for gout (was prescribed by Dr. Doristine Mango)"  It says [facility] [address] Is the pharmacy

## 2016-04-04 NOTE — Telephone Encounter (Signed)
Patient advised.

## 2016-04-04 NOTE — Telephone Encounter (Signed)
Sent #5, take with food.  Update me if not better.  Thanks.

## 2016-04-23 ENCOUNTER — Telehealth: Payer: Self-pay

## 2016-04-23 NOTE — Telephone Encounter (Signed)
Pt walked in and wanted to know if he needed a pneumonia shot; advised pt had prevnar 13 on 07/11/15 and pneumovax on 06/20/14. Pt does not need another pneumonia vaccine at this time. Pt voiced understanding.

## 2016-05-06 ENCOUNTER — Encounter: Payer: Self-pay | Admitting: Family Medicine

## 2016-05-06 ENCOUNTER — Ambulatory Visit (INDEPENDENT_AMBULATORY_CARE_PROVIDER_SITE_OTHER): Payer: Medicare HMO | Admitting: Family Medicine

## 2016-05-06 ENCOUNTER — Other Ambulatory Visit: Payer: Self-pay | Admitting: *Deleted

## 2016-05-06 DIAGNOSIS — R21 Rash and other nonspecific skin eruption: Secondary | ICD-10-CM | POA: Diagnosis not present

## 2016-05-06 DIAGNOSIS — L989 Disorder of the skin and subcutaneous tissue, unspecified: Secondary | ICD-10-CM | POA: Insufficient documentation

## 2016-05-06 MED ORDER — CLOBETASOL PROPIONATE 0.05 % EX CREA: 1.0000 "application " | TOPICAL_CREAM | Freq: Two times a day (BID) | CUTANEOUS | 1 refills | Status: DC | PRN

## 2016-05-06 MED ORDER — LEVOTHYROXINE SODIUM 25 MCG PO TABS: 50.0000 ug | ORAL_TABLET | Freq: Every day | ORAL | 5 refills | Status: DC

## 2016-05-06 NOTE — Assessment & Plan Note (Signed)
I don't know if some of the rash if more prominent from some incidental bruising with scratching.  D/w pt.  Doesn't appear infectious.  Not bleeding o/w.  Would use clobetasol and update me if not better so we can refer to derm.  No clear cause, no single obvious trigger, d/w pt.  He agrees.

## 2016-05-06 NOTE — Patient Instructions (Signed)
Keep using clobetasol in the meantime and update me if not better so we can set you up with the skin clinic.  Take care.  Glad to see you.

## 2016-05-06 NOTE — Progress Notes (Signed)
Itchy rash on arms.  B arms.  Diffusely down the flexor>extensor sides of B arms.  Had happened about 6-8 months ago, resolved on its own.  Waking up scratching.    Not on prednisone currently.  Has clobetasol in distant past and tried it again recently with some relief.  Has used some OTC hydrocortisone, with mild/temporary relief.    Not lightheaded.  Still on baseline meds.  D/w pt.    Taking tramadol only if needed.  Not daily use.  No ADE on med.    Meds, vitals, and allergies reviewed.   ROS: Per HPI unless specifically indicated in ROS section   nad ncat Neck supple, no LA rrr ctab B arms with similar exam, rash diffusely down the flexor>extensor sides of B arms. Raised rash with some bruising noted but not ulceration.  Doesn't feel hot to the touch.  No lesion on the hands/palms. No trunk lesions.  Rash isn't dermatomal.

## 2016-05-06 NOTE — Progress Notes (Signed)
Pre visit review using our clinic review tool, if applicable. No additional management support is needed unless otherwise documented below in the visit note. 

## 2016-05-17 ENCOUNTER — Telehealth: Payer: Self-pay

## 2016-05-17 NOTE — Telephone Encounter (Signed)
Pt cannot find clobetasol cream rx given to pt on 05/06/16,  I spoke with Crystal at [facility] [address] and that rx is on hold at [facility]; too early to fill; can be filled on 05/18/16. Pt will ck with pharmacy tomorrow.

## 2016-05-23 ENCOUNTER — Telehealth: Payer: Self-pay | Admitting: Cardiovascular Disease

## 2016-05-23 ENCOUNTER — Other Ambulatory Visit: Payer: Self-pay | Admitting: Cardiovascular Disease

## 2016-05-23 MED ORDER — APIXABAN 5 MG PO TABS: 5.0000 mg | ORAL_TABLET | Freq: Two times a day (BID) | ORAL | 3 refills | Status: DC

## 2016-05-23 NOTE — Telephone Encounter (Signed)
Refill sent.

## 2016-05-23 NOTE — Telephone Encounter (Signed)
°*  STAT* If patient is at the pharmacy, call can be transferred to refill team.   1. Which medications need to be refilled? (please list name of each medication and dose if known) Eliquis  5 mg po bid   2. Which pharmacy/location (including street and city if local pharmacy) is medication to be sent to? [facility] [address] [place]   3. Do they need a 30 day or 90 day supply? 90

## 2016-05-24 ENCOUNTER — Other Ambulatory Visit: Payer: Self-pay | Admitting: *Deleted

## 2016-05-24 ENCOUNTER — Telehealth: Payer: Self-pay | Admitting: Cardiovascular Disease

## 2016-05-24 MED ORDER — SACUBITRIL-VALSARTAN 24-26 MG PO TABS: 1.0000 | ORAL_TABLET | Freq: Two times a day (BID) | ORAL | 3 refills | Status: DC

## 2016-05-24 NOTE — Telephone Encounter (Signed)
Entresto #180 R#3 sent to [facility].

## 2016-05-24 NOTE — Telephone Encounter (Signed)
°*  STAT* If patient is at the pharmacy, call can be transferred to refill team.   1. Which medications need to be refilled? (please list name of each medication and dose if known) Entresto  Po bid   2. Which pharmacy/location (including street and city if local pharmacy) is medication to be sent to? [facility] [address]   3. Do they need a 30 day or 90 day supply? 90 PATIENT IS OUT OF MEDICATION

## 2016-05-28 ENCOUNTER — Telehealth: Payer: Self-pay | Admitting: Cardiovascular Disease

## 2016-05-28 NOTE — Telephone Encounter (Signed)
Spoke w/ pt.  Advised him that I am leaving him 4 boxes of Entresto 24-26 mg at the front desk to p/u at his convenience.  He is appreciative and will be by this afternoon.

## 2016-05-28 NOTE — Telephone Encounter (Signed)
Patient states that he hasn't taken any Entresto in 4 days due to  [facility] not having any and is waiting for the Entresto to come in any day. Will it cause harm not taking it for so long ? Also requesting samples. Please call patient.

## 2016-05-29 ENCOUNTER — Other Ambulatory Visit: Payer: Self-pay | Admitting: Family Medicine

## 2016-05-29 ENCOUNTER — Other Ambulatory Visit: Payer: Self-pay | Admitting: *Deleted

## 2016-05-29 ENCOUNTER — Telehealth: Payer: Self-pay | Admitting: Cardiovascular Disease

## 2016-05-29 MED ORDER — SACUBITRIL-VALSARTAN 24-26 MG PO TABS: 1.0000 | ORAL_TABLET | Freq: Two times a day (BID) | ORAL | 3 refills | Status: DC

## 2016-05-29 NOTE — Telephone Encounter (Signed)
Requested Prescriptions   Signed Prescriptions Disp Refills  . sacubitril-valsartan (ENTRESTO) 24-26 MG 180 tablet 3    Sig: Take 1 tablet by mouth 2 (two) times daily.    Authorizing Provider: GOLLAN, TIMOTHY J    Ordering User: LOPEZ, MARINA C    

## 2016-05-29 NOTE — Telephone Encounter (Signed)
°*  STAT* If patient is at the pharmacy, call can be transferred to refill team.   1. Which medications need to be refilled? (please list name of each medication and dose if known) entresto   2. Which pharmacy/location (including street and city if local pharmacy) is medication to be sent to? [facility] [address]  3. Do they need a 30 day or 90 day supply? 90 day

## 2016-05-29 NOTE — Telephone Encounter (Signed)
Please call in.  Thanks.   

## 2016-05-29 NOTE — Telephone Encounter (Signed)
Requested Prescriptions   Signed Prescriptions Disp Refills  . sacubitril-valsartan (ENTRESTO) 24-26 MG 180 tablet 3    Sig: Take 1 tablet by mouth 2 (two) times daily.    Authorizing Provider: GOLLAN, TIMOTHY J    Ordering User: Nyonna Hargrove C    

## 2016-05-29 NOTE — Telephone Encounter (Signed)
Received refill electronically Last refill 03/17/16 #120/1 Last office visit 05/06/16

## 2016-05-29 NOTE — Telephone Encounter (Signed)
Rx called to pharmacy as instructed. 

## 2016-05-30 ENCOUNTER — Ambulatory Visit (INDEPENDENT_AMBULATORY_CARE_PROVIDER_SITE_OTHER): Payer: Medicare HMO | Admitting: Family Medicine

## 2016-05-30 ENCOUNTER — Encounter: Payer: Self-pay | Admitting: Family Medicine

## 2016-05-30 VITALS — BP 102/58 | HR 61 | Temp 98.4°F | Wt 173.5 lb

## 2016-05-30 DIAGNOSIS — R21 Rash and other nonspecific skin eruption: Secondary | ICD-10-CM | POA: Diagnosis not present

## 2016-05-30 NOTE — Progress Notes (Signed)
Pre visit review using our clinic review tool, if applicable. No additional management support is needed unless otherwise documented below in the visit note. 

## 2016-05-30 NOTE — Patient Instructions (Addendum)
Devin Ramsey will call about your referral [facility] at [facility]). In the meantime, use clobetasol and take prednisone for 3 days if the itching isn't any better.   Take care.  Glad to see you.

## 2016-05-30 NOTE — Progress Notes (Signed)
Not on pred currently.    Now with rash and itching.  This is the 3rd episode.  Initially about 6 months ago, then about 1 month ago, then returned in the last few days.  No clear cause or trigger known.  Has been using clobetalsol with some temporary relief of itching, "but it doesn't fix it."    Meds, vitals, and allergies reviewed.   ROS: Per HPI unless specifically indicated in ROS section   nad ncat rrr ctab And with L > R macular rash/hyperpigmentation noted w/o ulceration or warmth

## 2016-05-31 NOTE — Assessment & Plan Note (Signed)
He has had recurrent itchy B rash w/o clear cause that responds to topical clobetasol.  D/w pt.  No clear trigger.  I want derm input.  D/w pt.  He agrees.  Continue clobetasol.  If profound itching and not controlled, then okay to use prednisone with food for 3 days.  D/w pt.  He agrees.

## 2016-06-07 ENCOUNTER — Telehealth: Payer: Self-pay | Admitting: Cardiovascular Disease

## 2016-06-07 NOTE — Telephone Encounter (Signed)
Patient says he cannot [facility] and [facility].  Insurance will not approve another refill.  He wants to know if he can go without it until insurance will approve another refill and pay for it.  Please call patient .

## 2016-06-07 NOTE — Telephone Encounter (Signed)
Pt has misplaced his bottle of Crestor and cannot afford to buy another month. He would like to know if he can not take it until his next refill is due and ins will cover, or if we have any other suggestions.

## 2016-06-09 NOTE — Telephone Encounter (Signed)
If unable to afford, will have to wait For $19 he can get a month through goodrx at [employer] May be even cheaper at [facility]

## 2016-06-10 NOTE — Telephone Encounter (Signed)
Spoke w/ pt.  Advised him of Dr. Windell Hummingbird recommendation.  He states that he did not pick up rx from the pharmacy, but they insist that someone did. He does not want another rx sent in, would like to wait until next month to p/u, as his SS check will not go that far.

## 2016-06-10 NOTE — Telephone Encounter (Signed)
Attempted to contact pt.  No answer, no machine.  Will attempt again later.  

## 2016-06-24 ENCOUNTER — Other Ambulatory Visit: Payer: Self-pay

## 2016-06-24 ENCOUNTER — Telehealth: Payer: Self-pay | Admitting: Cardiovascular Disease

## 2016-06-24 MED ORDER — METOPROLOL SUCCINATE ER 25 MG PO TB24: 25.0000 mg | ORAL_TABLET | Freq: Every day | ORAL | 3 refills | Status: DC

## 2016-06-24 NOTE — Telephone Encounter (Signed)
Requested Prescriptions   Signed Prescriptions Disp Refills  . metoprolol succinate (TOPROL XL) 25 MG 24 hr tablet 90 tablet 3    Sig: Take 1 tablet (25 mg total) by mouth daily.    Authorizing Provider: GOLLAN, TIMOTHY J    Ordering User: Carrieann Spielberg N    

## 2016-06-24 NOTE — Telephone Encounter (Signed)
Refill for Metoprolol Succinate 24mg  sent to [facility].

## 2016-06-24 NOTE — Telephone Encounter (Signed)
*  STAT* If patient is at the pharmacy, call can be transferred to refill team.   1. Which medications need to be refilled? (please list name of each medication and dose if known) metoprolol succinate (TOPROL XL) 25 MG 24 hr tablet  2. Which pharmacy/location (including street and city if local pharmacy) is medication to be sent to? [facility] [address]  3. Do they need a 30 day or 90 day supply? 90 day.

## 2016-07-01 ENCOUNTER — Ambulatory Visit (INDEPENDENT_AMBULATORY_CARE_PROVIDER_SITE_OTHER)
Admission: RE | Admit: 2016-07-01 | Discharge: 2016-07-01 | Disposition: A | Discharge status: Home / Self Care | Payer: Medicare HMO | Source: Ambulatory Visit | Attending: Family Medicine | Admitting: Family Medicine

## 2016-07-01 ENCOUNTER — Encounter: Payer: Self-pay | Admitting: Family Medicine

## 2016-07-01 ENCOUNTER — Ambulatory Visit (INDEPENDENT_AMBULATORY_CARE_PROVIDER_SITE_OTHER): Payer: Medicare HMO | Admitting: Family Medicine

## 2016-07-01 VITALS — BP 122/70 | HR 67 | Temp 98.5°F | Wt 172.0 lb

## 2016-07-01 DIAGNOSIS — G8929 Other chronic pain: Secondary | ICD-10-CM

## 2016-07-01 DIAGNOSIS — M25562 Pain in left knee: Secondary | ICD-10-CM

## 2016-07-01 DIAGNOSIS — M1712 Unilateral primary osteoarthritis, left knee: Secondary | ICD-10-CM | POA: Diagnosis not present

## 2016-07-01 MED ORDER — TRAMADOL HCL 50 MG PO TABS: ORAL_TABLET | ORAL | 1 refills | Status: DC

## 2016-07-01 NOTE — Patient Instructions (Signed)
Use the tramadol for now.  Go to the lab on the way out.  We'll contact you with your xray report. We'll go from there.  Take care.  Glad to see you.

## 2016-07-01 NOTE — Progress Notes (Signed)
B knee pain. L>R knee.  Taking tramadol for pain, with some relief.  H/o knee injury L knee in ~1984.  Always has a knot in the L knee since that injury, medially.  No locking.  No bruising.  Noted pain and swelling.  Less pain with extension and if not weight bearing.  More pain with flexion.  He cannot feel a grind with movement.  No nsaids with other meds, d/w pt.  He isn't icing his leg much but had been using a cold wet rag.  He can't tolerate icing.  He hasn't used a knee sleeve recently.    At baseline weight.  Compliant with meds.  Rare cough.  No fevers.  No sputum.    Meds, vitals, and allergies reviewed.   ROS: Per HPI unless specifically indicated in ROS section   nad ncat L knee with obvious crepitus on ROM.  Medially ttp with chronic medial > lateral joint changes.   No locking on ROM.   Walking with limp Also with coarse BS cleared with a cough.

## 2016-07-01 NOTE — Progress Notes (Signed)
Pre visit review using our clinic review tool, if applicable. No additional management support is needed unless otherwise documented below in the visit note. 

## 2016-07-02 ENCOUNTER — Encounter: Payer: Self-pay | Admitting: Cardiovascular Disease

## 2016-07-02 ENCOUNTER — Ambulatory Visit (INDEPENDENT_AMBULATORY_CARE_PROVIDER_SITE_OTHER): Payer: Medicare HMO | Admitting: Cardiovascular Disease

## 2016-07-02 VITALS — BP 102/58 | HR 68 | Ht 70.0 in | Wt 174.0 lb

## 2016-07-02 DIAGNOSIS — E785 Hyperlipidemia, unspecified: Secondary | ICD-10-CM

## 2016-07-02 DIAGNOSIS — I5022 Chronic systolic (congestive) heart failure: Secondary | ICD-10-CM

## 2016-07-02 DIAGNOSIS — I420 Dilated cardiomyopathy: Secondary | ICD-10-CM

## 2016-07-02 DIAGNOSIS — I10 Essential (primary) hypertension: Secondary | ICD-10-CM

## 2016-07-02 DIAGNOSIS — I255 Ischemic cardiomyopathy: Secondary | ICD-10-CM

## 2016-07-02 DIAGNOSIS — I481 Persistent atrial fibrillation: Secondary | ICD-10-CM | POA: Diagnosis not present

## 2016-07-02 DIAGNOSIS — I4819 Other persistent atrial fibrillation: Secondary | ICD-10-CM

## 2016-07-02 NOTE — Patient Instructions (Addendum)
Medication Instructions:   No medication changes made  Labwork:  Lipid panel, BMP and LFTs fasting another day, in the AM  Testing/Procedures:  No further testing at this time   I recommend watching educational videos on topics of interest to you at:       [email or web address]  Enter code: [email or web address]    Follow-Up: It was a pleasure seeing you in the office today. Please call us if you have new issues that need to be addressed before your next appt.  [phone]  Your physician wants you to follow-up in: 6 months.  You will receive a reminder letter in the mail two months in advance. If you don't receive a letter, please call our office to schedule the follow-up appointment.  If you need a refill on your cardiac medications before your next appointment, please call your pharmacy.

## 2016-07-02 NOTE — Progress Notes (Signed)
Cardiology Office Note  Date:  07/02/2016   ID:  Devin Ramsey, Devin Ramsey March 17, 1946, MRN [id number]  PCP:  Crawford Givens, MD   Chief Complaint  Patient presents with  . other    16mo f/u. Pt c/o some dizziness. Reviewed meds with pt verbally.    HPI:  Devin Ramsey is a 71 year old gentleman who presented to the hospital 06/2014 with bronchitis symptoms, EKG with new atrial fibrillation, chest x-ray showing pneumonia who was kept in the hospital for 5 days, COPD exacerbation. He had cardioversion 08/23/2014 for A. fib flutter, He follows up today to discuss atrial fibrillation management Previous echocardiogram showed ejection fraction 35-40%, February 2016 in the setting of atrial fibrillation Stopped smoking 06/2014, wife still smokes  In follow-up today he reports that he feels well  Denies any chest pain, stable mild shortness of breath on exertion  No regular exercise program  Biggest complaint is severe knee pain  Recent x-ray of left knee shows moderate degenerative change  Tried new shoes, seem to make his symptoms worse  Wanted to know if he could take oral prednisone for his knee pain  Blood pressure running low but denies any lightheadedness Tolerating eliquis 5 BID On entresto BID  Discussed his low ejection fraction with him, he does not want any ischemia workup as he feels well   ejection fraction 35%   EKG with atrial flutter, rate 68 bpm,LBBB  Other past medical history EKG in January 2017 showing atrial fibrillation Prior EKG in October and November 2016 shows normal sinus rhythm with left bundle branch block  Total chol 216, LDL 126  previous Ejection fraction remains low at 35-40%, no significant improvement by restoring normal sinus rhythm  Lab work from the hospital showed sodium 132, potassium 5.1, hemoglobin A1c 5.3, negative cardiac enzymes, Total cholesterol   Chest x-ray with interstitial pulmonary edema, small bilateral pleural  effusions  Echocardiogram from 06/29/2014 showing ejection fraction 35-40%, septal motion consistent with bundle branch block, normal right ventricular systolic pressure, normal left atrial size   PMH:   has a past medical history of A-fib (HCC); Acute systolic CHF (congestive heart failure) (HCC); Bilateral pleural effusion; COPD (chronic obstructive pulmonary disease) (HCC); Gout; Pneumonia (2016); Thyroid disease; and Tobacco abuse.  PSH:    Past Surgical History:  Procedure Laterality Date  . HEMORRHOID SURGERY    . SHOULDER SURGERY     right shoulder   . TONSILLECTOMY      Current Outpatient Prescriptions  Medication Sig Dispense Refill  . apixaban (ELIQUIS) 5 MG TABS tablet Take 1 tablet (5 mg total) by mouth 2 (two) times daily. 180 tablet 3  . clobetasol cream (TEMOVATE) 0.05 % Apply 1 application topically 2 (two) times daily as needed. 45 g 1  . cyclobenzaprine (FLEXERIL) 10 MG tablet Take 0.5-1 tablets (5-10 mg total) by mouth 3 (three) times daily as needed for muscle spasms. Sedation caution 30 tablet 1  . furosemide (LASIX) 20 MG tablet Take 1 tablet (20 mg total) by mouth daily as needed. 90 tablet 3  . hydrOXYzine (ATARAX/VISTARIL) 10 MG tablet daily as needed.     Marland Kitchen levothyroxine (SYNTHROID, LEVOTHROID) 25 MCG tablet Take 2 tablets (50 mcg total) by mouth daily before breakfast. 60 tablet 5  . metoprolol succinate (TOPROL XL) 25 MG 24 hr tablet Take 1 tablet (25 mg total) by mouth daily. 90 tablet 3  . potassium chloride SA (KLOR-CON M20) 20 MEQ tablet Take 0.5 tablets (10 mEq total) by mouth  daily. (Patient taking differently: Take 10 mEq by mouth daily as needed. ) 45 tablet 3  . predniSONE (DELTASONE) 20 MG tablet Take 1 tablet (20 mg total) by mouth daily as needed. For gout. 5 tablet 1  . rosuvastatin (CRESTOR) 10 MG tablet Take 1 tablet (10 mg total) by mouth daily. 30 tablet 11  . sacubitril-valsartan (ENTRESTO) 24-26 MG Take 1 tablet by mouth 2 (two) times  daily. 180 tablet 3  . traMADol (ULTRAM) 50 MG tablet TAKE TWO TABLETS BY MOUTH EVERY 12 HOURS AS NEEDED KNEE  PAIN 120 tablet 1  . triamcinolone cream (KENALOG) 0.1 % daily as needed.      No current facility-administered medications for this visit.      Allergies:   Codeine   Social History:  The patient  reports that he quit smoking about 2 years ago. His smoking use included Cigarettes. He quit after 55.00 years of use. He has never used smokeless tobacco. He reports that he drinks alcohol. He reports that he does not use drugs.   Family History:   He was adopted. Family history is unknown by patient.    Review of Systems: Review of Systems  Constitutional: Negative.   Respiratory: Positive for shortness of breath.   Cardiovascular: Negative.   Gastrointestinal: Negative.   Musculoskeletal: Positive for joint pain.  Neurological: Negative.   Psychiatric/Behavioral: Negative.   All other systems reviewed and are negative.    PHYSICAL EXAM: VS:  BP (!) 102/58 (BP Location: Left Arm, Patient Position: Sitting, Cuff Size: Normal)   Pulse 68   Ht 5\' 10"  (1.778 m)   Wt 174 lb (78.9 kg)   BMI 24.97 kg/m  , BMI Body mass index is 24.97 kg/m. GEN: Well nourished, well developed, in no acute distress  HEENT: normal  Neck: no JVD, carotid bruits, or masses Cardiac: RRR; no murmurs, rubs, or gallops,no edema  Respiratory:  Moderately decreased breath sounds throughout otherwise clear, normal work of breathing GI: soft, nontender, nondistended, + BS MS: no deformity or atrophy  Skin: warm and dry, no rash Neuro:  Strength and sensation are intact Psych: euthymic mood, full affect    Recent Labs: 01/04/2016: BUN 5; Creatinine, Ser 1.00; Potassium 3.9; Sodium 139 01/08/2016: TSH 7.73    Lipid Panel No results found for: CHOL, HDL, LDLCALC, TRIG    Wt Readings from Last 3 Encounters:  07/02/16 174 lb (78.9 kg)  07/01/16 172 lb (78 kg)  05/30/16 173 lb 8 oz (78.7 kg)        ASSESSMENT AND PLAN:  Congestive dilated cardiomyopathy (HCC) - Plan: EKG 12-Lead, Lipid Profile, Hepatic function panel, Basic Metabolic Panel (BMET) We'll continue on current medication regiment, on beta blocker, entresto low blood pressure, unable to advance his medications. Potentially could add Aldactone Currently on Lasix daily as needed for weight gain or leg swelling BMP ordered  Chronic systolic CHF (congestive heart failure) (HCC) - Plan: EKG 12-Lead, Lipid Profile, Hepatic function panel, Basic Metabolic Panel (BMET)  appears euvolemic on today's visit, recommended he monitor his weight   Essential hypertension - Plan: EKG 12-Lead, Lipid Profile, Hepatic function panel, Basic Metabolic Panel (BMET) Blood pressure is well controlled on today's visit. No changes made to the medications.  Persistent atrial fibrillation (HCC) - Plan: EKG 12-Lead, Lipid Profile, Hepatic function panel, Basic Metabolic Panel (BMET)  rate well controlled, tolerating anticoagulation, discussed with him  He does not have any symptoms, appears to be atrial flutter on today's visit  Cardiomyopathy, ischemic - Plan: EKG 12-Lead, Lipid Profile, Hepatic function panel, Basic Metabolic Panel (BMET)  Hyperlipidemia, unspecified hyperlipidemia type - Plan: EKG 12-Lead, Lipid Profile, Hepatic function panel, Basic Metabolic Panel (BMET)  we have placed an order for lipid panel, basic metabolic panel, LFTs   Total encounter time more than 25 minutes  Greater than 50% was spent in counseling and coordination of care with the patient   Disposition:   F/U  6 months   Orders Placed This Encounter  Procedures  . Lipid Profile  . Hepatic function panel  . Basic Metabolic Panel (BMET)  . EKG 12-Lead     Signed, Dossie Arbour, M.D., Ph.D. 07/02/2016  [facility] [place], [place] [phone]

## 2016-07-03 NOTE — Assessment & Plan Note (Signed)
Likely chronic osteoarthritis. Check plain films today. I will ask for input from Dr. Dallas Schimke regarding possibility of injection especially given that the patient is on Eiquis. See notes on imaging.  continue tramadol for now.

## 2016-07-08 ENCOUNTER — Other Ambulatory Visit: Payer: Medicare HMO

## 2016-07-17 ENCOUNTER — Ambulatory Visit (INDEPENDENT_AMBULATORY_CARE_PROVIDER_SITE_OTHER): Payer: Medicare HMO | Admitting: Family Medicine

## 2016-07-17 ENCOUNTER — Encounter: Payer: Self-pay | Admitting: Family Medicine

## 2016-07-17 VITALS — BP 90/62 | HR 77 | Temp 98.2°F | Ht 70.0 in | Wt 175.5 lb

## 2016-07-17 DIAGNOSIS — M1712 Unilateral primary osteoarthritis, left knee: Secondary | ICD-10-CM | POA: Diagnosis not present

## 2016-07-17 NOTE — Progress Notes (Signed)
Dr. Karleen Hampshire T. Aliscia Clayton, MD, CAQ Sports [facility] [address] [place], [address] Phone: [phone] Fax: [phone]  07/17/2016  Patient: Devin Ramsey, MRN: [id number], DOB: 1945-07-22, 71 y.o.  Primary Physician:  Crawford Givens, MD   Chief Complaint  Patient presents with  . Knee Pain    Left   Subjective:   Devin Ramsey is a 71 y.o. 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 Previously has torn multiple ligaments in his knee and had some trauma. He does have intermittent effusions. Sometimes his knee will bother him and bother him when he is laying down at nighttime, but right now he is not having any pain and hasn't had any pain for the last few days.  History is also significant for gout. He also has significant congestive heart failure.  L knee OA: Bothers him whene he lays down.  Tore ligaments medially Intermittently painful  Mod effusion He wants to hold on any procedure now  Past Medical History, Surgical History, Social History, Family History, Problem List, Medications, and Allergies have been reviewed and updated if relevant.  Patient Active Problem List   Diagnosis Date Noted  . Rash and nonspecific skin eruption 05/06/2016  . Olecranon bursitis 03/07/2016  . Axillary pain 02/02/2016  . Knee pain 01/08/2016  . Primary osteoarthritis of left knee 01/04/2016  . Cardiomyopathy, ischemic 01/04/2016  . Hyperlipidemia 01/04/2016  . Hypothyroidism 11/19/2015  . Advance care planning 11/19/2015  . Atrial fibrillation (HCC) 06/29/2015  . Biceps tendon rupture 03/24/2015  . COPD with acute exacerbation (HCC) 07/11/2014  . Congestive dilated cardiomyopathy (HCC) 07/11/2014  . Chronic systolic CHF (congestive heart failure) (HCC) 07/11/2014  . Essential hypertension 07/11/2014    Past Medical History:    Diagnosis Date  . A-fib (HCC)   . Acute systolic CHF (congestive heart failure) (HCC)    EF 35% to 40%  . Bilateral pleural effusion   . COPD (chronic obstructive pulmonary disease) (HCC)   . Gout   . Pneumonia 2016  . Thyroid disease   . Tobacco abuse     Past Surgical History:  Procedure Laterality Date  . HEMORRHOID SURGERY    . SHOULDER SURGERY     right shoulder   . TONSILLECTOMY      Social History   Social History  . Marital status: Married    Spouse name: N/A  . Number of children: N/A  . Years of education: N/A   Occupational History  . Not on file.   Social History Main Topics  . Smoking status: Former Smoker    Years: 55.00    Types: Cigarettes    Quit date: 06/27/2014  . Smokeless tobacco: Never Used  . Alcohol use Yes     Comment: 2-3 beers a day average  . Drug use: No  . Sexual activity: Not on file   Other Topics Concern  . Not on file   Social History Narrative   Married 1966   From [place] fan   1 son, local    Family History  Problem Relation Age of Onset  . Adopted: Yes  . Family history unknown: Yes    Allergies  Allergen Reactions  . Codeine     Tolerates other opiates.      Medication list reviewed and updated in full in [facility].  GEN: No fevers, chills. Nontoxic. Primarily MSK c/o today. MSK: Detailed in the HPI GI: tolerating PO intake without difficulty Neuro: No numbness, parasthesias, or tingling associated. Otherwise the pertinent positives of the ROS are noted above.   Objective:   BP 90/62   Pulse 77   Temp 98.2 F (36.8 C) (Oral)   Ht 5\' 10"  (1.778 m)   Wt 175 lb 8 oz (79.6 kg)   BMI 25.18 kg/m    GEN: WDWN, NAD, Non-toxic, Alert & Oriented x 3 HEENT: Atraumatic, Normocephalic.  Ears and Nose: No external deformity. EXTR: No clubbing/cyanosis/edema NEURO: Normal gait.  PSYCH: Normally interactive. Conversant. Not depressed or anxious appearing.  Calm demeanor.    Left knee: The  patient lacks 5 of extension. Flexion to 100. Moderate effusion. Mild medial and joint line tenderness and to a lesser extent on the lateral aspect. Flexion pinch and McMurray's calls some mild pain. Anterior cruciate ligament, PCL, MCL, and LCL are all stable.  Notable quad wasting  Radiology: Dg Knee 3 Views Left  Result Date: 07/01/2016 CLINICAL DATA:  Chronic knee pain EXAM: LEFT KNEE - 3 VIEW COMPARISON:  None FINDINGS: Moderately advanced tricompartmental degenerative change in the knee with joint space narrowing and spurring. Large area of soft tissue calcification medial to the femoral condyles which may be calcific tendonitis or chronic ligament injury. Small joint effusion. Atherosclerotic calcification. Negative for fracture. IMPRESSION: Moderate degenerative change. Electronically Signed   By: Marlan Palau M.D.   On: 07/01/2016 13:12    Assessment and Plan:   Primary localized osteoarthritis of left knee  He is actually not having any pain at all today.  He does have an effusion and considerable changes on her radiographical images as well as his clinical exam. Former Scientist, water quality. At this point, I suggested that he continue to be physically active to maintain his strength.  When he becomes more symptomatic, then he will follow-up with me, and we could consider doing an aspiration and injection.  He is also on Eliquis, which limits some of his oral medication  Follow-up: prn  Signed,  Jerett Odonohue T. Kruz Chiu, MD   Allergies as of 07/17/2016      Reactions   Codeine    Tolerates other opiates.        Medication List       Accurate as of 07/17/16  2:15 PM. Always use your most recent med list.          apixaban 5 MG Tabs tablet Commonly known as:  ELIQUIS Take 1 tablet (5 mg total) by mouth 2 (two) times daily.   clobetasol cream 0.05 % Commonly known as:  TEMOVATE Apply 1 application topically 2 (two) times daily as needed.   cyclobenzaprine 10 MG  tablet Commonly known as:  FLEXERIL Take 0.5-1 tablets (5-10 mg total) by mouth 3 (three) times daily as needed for muscle spasms. Sedation caution   furosemide 20 MG tablet Commonly known as:  LASIX Take 1 tablet (20 mg total) by mouth daily as needed.   hydrOXYzine 10 MG tablet Commonly known as:  ATARAX/VISTARIL daily as needed.   levothyroxine 25 MCG tablet Commonly known as:  SYNTHROID, LEVOTHROID Take 2 tablets (50 mcg total) by mouth daily before breakfast.   metoprolol succinate 25 MG 24 hr tablet Commonly known as:  TOPROL XL Take 1 tablet (25 mg total) by mouth daily.   potassium chloride SA 20 MEQ tablet Commonly known as:  KLOR-CON M20 Take 0.5 tablets (10  mEq total) by mouth daily.   predniSONE 20 MG tablet Commonly known as:  DELTASONE Take 1 tablet (20 mg total) by mouth daily as needed. For gout.   rosuvastatin 10 MG tablet Commonly known as:  CRESTOR Take 1 tablet (10 mg total) by mouth daily.   sacubitril-valsartan 24-26 MG Commonly known as:  ENTRESTO Take 1 tablet by mouth 2 (two) times daily.   traMADol 50 MG tablet Commonly known as:  ULTRAM TAKE TWO TABLETS BY MOUTH EVERY 12 HOURS AS NEEDED KNEE  PAIN   triamcinolone cream 0.1 % Commonly known as:  KENALOG daily as needed.

## 2016-07-17 NOTE — Progress Notes (Signed)
Pre visit review using our clinic review tool, if applicable. No additional management support is needed unless otherwise documented below in the visit note. 

## 2016-07-18 ENCOUNTER — Other Ambulatory Visit (INDEPENDENT_AMBULATORY_CARE_PROVIDER_SITE_OTHER): Payer: Medicare HMO

## 2016-07-18 DIAGNOSIS — I5022 Chronic systolic (congestive) heart failure: Secondary | ICD-10-CM

## 2016-07-18 DIAGNOSIS — I10 Essential (primary) hypertension: Secondary | ICD-10-CM

## 2016-07-18 DIAGNOSIS — I420 Dilated cardiomyopathy: Secondary | ICD-10-CM | POA: Diagnosis not present

## 2016-07-18 DIAGNOSIS — E785 Hyperlipidemia, unspecified: Secondary | ICD-10-CM

## 2016-07-18 DIAGNOSIS — I4819 Other persistent atrial fibrillation: Secondary | ICD-10-CM

## 2016-07-18 DIAGNOSIS — I255 Ischemic cardiomyopathy: Secondary | ICD-10-CM

## 2016-07-19 LAB — BASIC METABOLIC PANEL
BUN / CREAT RATIO: 6 — AB (ref 10–24)
BUN: 5 mg/dL — ABNORMAL LOW (ref 8–27)
CO2: 24 mmol/L (ref 18–29)
CREATININE: 0.87 mg/dL (ref 0.76–1.27)
Calcium: 8.4 mg/dL — ABNORMAL LOW (ref 8.6–10.2)
Chloride: 102 mmol/L (ref 96–106)
GFR calc Af Amer: 100 mL/min/{1.73_m2} (ref >59)
GFR, EST NON AFRICAN AMERICAN: 87 mL/min/{1.73_m2} (ref >59)
Glucose: 104 mg/dL — ABNORMAL HIGH (ref 65–99)
Potassium: 4.3 mmol/L (ref 3.5–5.2)
SODIUM: 141 mmol/L (ref 134–144)

## 2016-07-19 LAB — LIPID PANEL
CHOLESTEROL TOTAL: 116 mg/dL (ref 100–199)
Chol/HDL Ratio: 2.8 ratio units (ref 0.0–5.0)
HDL: 41 mg/dL (ref >39)
LDL Calculated: 34 mg/dL (ref 0–99)
TRIGLYCERIDES: 205 mg/dL — AB (ref 0–149)
VLDL Cholesterol Cal: 41 mg/dL — ABNORMAL HIGH (ref 5–40)

## 2016-07-19 LAB — HEPATIC FUNCTION PANEL
ALBUMIN: 3.6 g/dL (ref 3.5–4.8)
ALK PHOS: 88 IU/L (ref 39–117)
ALT: 10 IU/L (ref 0–44)
AST: 17 IU/L (ref 0–40)
BILIRUBIN TOTAL: 0.5 mg/dL (ref 0.0–1.2)
BILIRUBIN, DIRECT: 0.18 mg/dL (ref 0.00–0.40)
Total Protein: 5.4 g/dL — ABNORMAL LOW (ref 6.0–8.5)

## 2016-07-31 ENCOUNTER — Other Ambulatory Visit: Payer: Self-pay | Admitting: *Deleted

## 2016-07-31 MED ORDER — ROSUVASTATIN CALCIUM 10 MG PO TABS: 10.0000 mg | ORAL_TABLET | Freq: Every day | ORAL | 3 refills | Status: DC

## 2016-08-12 ENCOUNTER — Telehealth: Payer: Self-pay | Admitting: Cardiovascular Disease

## 2016-08-12 NOTE — Telephone Encounter (Signed)
Spoke w/ pt.  Advised him that he may take prednisone if prescribed by his doctor.  Asked him to call back w/ any further questions or concerns.

## 2016-08-12 NOTE — Telephone Encounter (Signed)
Pt states he has gout in his knee, and asks if he can take Prednisone with his heart medication. Please call and advise.

## 2016-09-16 NOTE — Telephone Encounter (Signed)
Patient wants to know if Devin Ramsey will write rx for prednisone refill   Advised to call pcp office as well because they wrote the original rx

## 2016-09-16 NOTE — Telephone Encounter (Signed)
Dr. Para March is treating pt's gout - pt will need to speak w/ PCP regarding more refills on prednisone.

## 2016-09-30 ENCOUNTER — Telehealth: Payer: Self-pay | Admitting: Family Medicine

## 2016-09-30 NOTE — Telephone Encounter (Signed)
Patient Name: Devin Ramsey DOB: November 02, 1945 Initial Comment Caller states c/o shortness of breath and low energy. Nurse Assessment Nurse: Charna Elizabeth, RN, Cathy Date/Time (Eastern Time): 09/30/2016 2:06:45 PM Confirm and document reason for call. If symptomatic, describe symptoms. ---[name] states he developed increased shortness of breath with exertion about 3 weeks ago that is present again today. No blueness around his lips or face. No chest pain. Alert and responsive. Does the patient have any new or worsening symptoms? ---Yes Will a triage be completed? ---Yes Related visit to physician within the last 2 weeks? ---Yes Does the PT have any chronic conditions? (i.e. diabetes, asthma, etc.) ---Yes List chronic conditions. ---Pneumonia in the past, Unknown heart problems (on blood thinners), doesn't want to give further information Is this a behavioral health or substance abuse call? ---No Guidelines Guideline Title Affirmed Question Affirmed Notes Breathing Difficulty [1] MILD difficulty breathing (e.g., minimal/no SOB at rest, SOB with walking, pulse <100) AND [2] NEW-onset or WORSE than normal Final Disposition User See Physician within 4 Hours (or PCP triage) Charna Elizabeth, RN, [name] declines the See MD within 4 Hour disposition due to transportation problems today. He asks if he can see the MD tomorrow. Called the office backline and notified Reena. Jolly disconnected while on hold. Reena states she will call him back. Referrals GO TO FACILITY REFUSED Disagree/Comply: Disagree Disagree/Comply Reason: Disagree with instructions

## 2016-09-30 NOTE — Telephone Encounter (Signed)
I spoke with pt and he does not have transportation to go anywhere today. Pt has SOB on and off; pt wants to be seen in office when he can see Dr Para March in the AM. Pt scheduled 30' appt on 10/03/16 with Dr Para March; pt said he also has sore spot on lt side of neck that comes and goes. Pt said he is not having any problems right now. Advised pt to stay out of heat and he voiced understanding. If pt condition worsens prior to appt pt will cb or go to UC. FYI to Dr Para March.

## 2016-10-03 ENCOUNTER — Encounter: Payer: Self-pay | Admitting: Family Medicine

## 2016-10-03 ENCOUNTER — Ambulatory Visit (INDEPENDENT_AMBULATORY_CARE_PROVIDER_SITE_OTHER): Payer: Medicare HMO | Admitting: Family Medicine

## 2016-10-03 VITALS — BP 104/50 | HR 81 | Temp 97.9°F | Wt 179.2 lb

## 2016-10-03 DIAGNOSIS — M542 Cervicalgia: Secondary | ICD-10-CM

## 2016-10-03 DIAGNOSIS — J449 Chronic obstructive pulmonary disease, unspecified: Secondary | ICD-10-CM | POA: Insufficient documentation

## 2016-10-03 DIAGNOSIS — M25569 Pain in unspecified knee: Secondary | ICD-10-CM

## 2016-10-03 MED ORDER — TRAMADOL HCL 50 MG PO TABS: ORAL_TABLET | ORAL | 2 refills | Status: DC

## 2016-10-03 MED ORDER — TIOTROPIUM BROMIDE MONOHYDRATE 18 MCG IN CAPS: 18.0000 ug | ORAL_CAPSULE | Freq: Every day | RESPIRATORY_TRACT | 12 refills | Status: DC

## 2016-10-03 MED ORDER — POTASSIUM CHLORIDE CRYS ER 20 MEQ PO TBCR: 10.0000 meq | EXTENDED_RELEASE_TABLET | Freq: Every day | ORAL | Status: DC | PRN

## 2016-10-03 MED ORDER — PREDNISONE 20 MG PO TABS: 20.0000 mg | ORAL_TABLET | Freq: Every day | ORAL | 11 refills | Status: DC | PRN

## 2016-10-03 NOTE — Assessment & Plan Note (Signed)
Likely a muscle spasm, intermittently noted.  Continue stretching and use heat prn.

## 2016-10-03 NOTE — Assessment & Plan Note (Signed)
D/w pt about second hand smoke exposure.   He is going to restart spiriva.   He doesn't appear fluid overloaded.   At this point still okay for outpatient f/u.

## 2016-10-03 NOTE — Assessment & Plan Note (Signed)
Okay to use tramadol prn.  No ADE on med.

## 2016-10-03 NOTE — Progress Notes (Signed)
SOB.  He has exposure to second hand smoke.  He clearly feels worse with exposure.  "If I overdo it, then I get short".  Better with resting.  Some cough.  Minimal sputum.  No BLE edema.  No fevers.  Sleeping flat but on his side.  Not SOB in the middle of the night.  No known wheeze.  He prev wheezed with smoking.  He has been off spiriva.    He needed refill on prednisone and tramadol.  Both help when needed, no ADE on med.   Episodic L neck pain, posterior.  "It was like I slept wrong."  Heat helps.  Leaning forward helps.  No midline pain.    Meds, vitals, and allergies reviewed.   ROS: Per HPI unless specifically indicated in ROS section   GEN: nad, alert and oriented HEENT: mucous membranes moist NECK: supple w/o LA, no lesions, not ttp CV: rrr.   PULM: global dec in BS but o/w ctab, no inc wob ABD: soft, +bs EXT: no edema SKIN: no acute rash

## 2016-10-03 NOTE — Patient Instructions (Addendum)
Stay away from anybody smoking.  Second hand smoke can make your breathing worse.   Add back the spiriva in the meantime.  That may gradually help.  Update me as needed.   Take care.  Glad to see you.

## 2016-10-28 ENCOUNTER — Other Ambulatory Visit: Payer: Self-pay

## 2016-10-28 MED ORDER — ROSUVASTATIN CALCIUM 10 MG PO TABS: 10.0000 mg | ORAL_TABLET | Freq: Every day | ORAL | 3 refills | Status: DC

## 2016-10-28 NOTE — Telephone Encounter (Signed)
Please review for refill. Thanks!  

## 2016-12-09 ENCOUNTER — Telehealth: Payer: Self-pay | Admitting: Cardiovascular Disease

## 2016-12-09 MED ORDER — APIXABAN 5 MG PO TABS: 5.0000 mg | ORAL_TABLET | Freq: Two times a day (BID) | ORAL | 3 refills | Status: DC

## 2016-12-09 NOTE — Telephone Encounter (Signed)
°*  STAT* If patient is at the pharmacy, call can be transferred to refill team.   1. Which medications need to be refilled? (please list name of each medication and dose if known) Eliquis 5 mg  2. Which pharmacy/location (including street and city if local pharmacy) is medication to be sent to?[facility] [address]  3. Do they need a 30 day or 90 day supply? 90 day

## 2016-12-29 NOTE — Progress Notes (Deleted)
Cardiology Office Note  Date:  12/29/2016   ID:  Devin Ramsey, Devin Ramsey 1945-12-04, MRN [id number]  PCP:  Devin Nam, MD   No chief complaint on file.   HPI:  Devin Ramsey is a 71 year old gentleman who presented to the hospital 06/2014 with bronchitis symptoms, EKG with new atrial fibrillation, chest x-ray showing pneumonia who was kept in the hospital for 5 days, COPD exacerbation. He had cardioversion 08/23/2014 for A. fib flutter, He follows up today to discuss atrial fibrillation management Previous echocardiogram showed ejection fraction 35-40%, February 2016 in the setting of atrial fibrillation Stopped smoking 06/2014, wife still smokes  In follow-up today he reports that he feels well  Denies any chest pain, stable mild shortness of breath on exertion  No regular exercise program  Biggest complaint is severe knee pain  Recent x-ray of left knee shows moderate degenerative change  Tried new shoes, seem to make his symptoms worse  Wanted to know if he could take oral prednisone for his knee pain  Blood pressure running low but denies any lightheadedness Tolerating eliquis 5 BID On entresto BID  Discussed his low ejection fraction with him, he does not want any ischemia workup as he feels well   ejection fraction 35%   EKG with atrial flutter, rate 68 bpm,LBBB  Other past medical history EKG in January 2017 showing atrial fibrillation Prior EKG in October and November 2016 shows normal sinus rhythm with left bundle branch block  Total chol 216, LDL 126  previous Ejection fraction remains low at 35-40%, no significant improvement by restoring normal sinus rhythm  Lab work from the hospital showed sodium 132, potassium 5.1, hemoglobin A1c 5.3, negative cardiac enzymes, Total cholesterol   Chest x-ray with interstitial pulmonary edema, small bilateral pleural effusions  Echocardiogram from 06/29/2014 showing ejection fraction 35-40%, septal motion consistent  with bundle branch block, normal right ventricular systolic pressure, normal left atrial size   PMH:   has a past medical history of A-fib (HCC); Acute systolic CHF (congestive heart failure) (HCC); Bilateral pleural effusion; COPD (chronic obstructive pulmonary disease) (HCC); Gout; Pneumonia (2016); Thyroid disease; and Tobacco abuse.  PSH:    Past Surgical History:  Procedure Laterality Date  . HEMORRHOID SURGERY    . SHOULDER SURGERY     right shoulder   . TONSILLECTOMY      Current Outpatient Prescriptions  Medication Sig Dispense Refill  . apixaban (ELIQUIS) 5 MG TABS tablet Take 1 tablet (5 mg total) by mouth 2 (two) times daily. 180 tablet 3  . clobetasol cream (TEMOVATE) 0.05 % Apply 1 application topically 2 (two) times daily as needed. 45 g 1  . cyclobenzaprine (FLEXERIL) 10 MG tablet Take 0.5-1 tablets (5-10 mg total) by mouth 3 (three) times daily as needed for muscle spasms. Sedation caution 30 tablet 1  . furosemide (LASIX) 20 MG tablet Take 1 tablet (20 mg total) by mouth daily as needed. 90 tablet 3  . hydrOXYzine (ATARAX/VISTARIL) 10 MG tablet daily as needed.     Marland Kitchen levothyroxine (SYNTHROID, LEVOTHROID) 25 MCG tablet Take 2 tablets (50 mcg total) by mouth daily before breakfast. 60 tablet 5  . metoprolol succinate (TOPROL XL) 25 MG 24 hr tablet Take 1 tablet (25 mg total) by mouth daily. 90 tablet 3  . potassium chloride SA (KLOR-CON M20) 20 MEQ tablet Take 0.5 tablets (10 mEq total) by mouth daily as needed (with lasix).    . predniSONE (DELTASONE) 20 MG tablet Take 1 tablet (20  mg total) by mouth daily as needed. For gout. 10 tablet 11  . rosuvastatin (CRESTOR) 10 MG tablet Take 1 tablet (10 mg total) by mouth daily. 30 tablet 3  . sacubitril-valsartan (ENTRESTO) 24-26 MG Take 1 tablet by mouth 2 (two) times daily. 180 tablet 3  . tiotropium (SPIRIVA HANDIHALER) 18 MCG inhalation capsule Place 1 capsule (18 mcg total) into inhaler and inhale daily. 30 capsule 12  .  traMADol (ULTRAM) 50 MG tablet TAKE TWO TABLETS BY MOUTH EVERY 12 HOURS AS NEEDED KNEE  PAIN 120 tablet 2  . triamcinolone cream (KENALOG) 0.1 % daily as needed.      No current facility-administered medications for this visit.      Allergies:   Codeine   Social History:  The patient  reports that he quit smoking about 2 years ago. His smoking use included Cigarettes. He quit after 55.00 years of use. He has never used smokeless tobacco. He reports that he drinks alcohol. He reports that he does not use drugs.   Family History:   He was adopted. Family history is unknown by patient.    Review of Systems: Review of Systems  Constitutional: Negative.   Respiratory: Positive for shortness of breath.   Cardiovascular: Negative.   Gastrointestinal: Negative.   Musculoskeletal: Positive for joint pain.  Neurological: Negative.   Psychiatric/Behavioral: Negative.   All other systems reviewed and are negative.    PHYSICAL EXAM: VS:  There were no vitals taken for this visit. , BMI There is no height or weight on file to calculate BMI. GEN: Well nourished, well developed, in no acute distress  HEENT: normal  Neck: no JVD, carotid bruits, or masses Cardiac: RRR; no murmurs, rubs, or gallops,no edema  Respiratory:  Moderately decreased breath sounds throughout otherwise clear, normal work of breathing GI: soft, nontender, nondistended, + BS MS: no deformity or atrophy  Skin: warm and dry, no rash Neuro:  Strength and sensation are intact Psych: euthymic mood, full affect    Recent Labs: 01/08/2016: TSH 7.73 07/18/2016: ALT 10; BUN 5; Creatinine, Ser 0.87; Potassium 4.3; Sodium 141    Lipid Panel Lab Results  Component Value Date   CHOL 116 07/18/2016   HDL 41 07/18/2016   LDLCALC 34 07/18/2016   TRIG 205 (H) 07/18/2016      Wt Readings from Last 3 Encounters:  10/03/16 179 lb 4 oz (81.3 kg)  07/17/16 175 lb 8 oz (79.6 kg)  07/02/16 174 lb (78.9 kg)       ASSESSMENT  AND PLAN:  Congestive dilated cardiomyopathy (HCC) -  We'll continue on current medication regiment, on beta blocker, entresto low blood pressure, unable to advance his medications. Potentially could add Aldactone Currently on Lasix daily as needed for weight gain or leg swelling BMP ordered  Chronic systolic CHF (congestive heart failure) (HCC) -   appears euvolemic on today's visit, recommended he monitor his weight   Essential hypertension - Plan: EKG 12-Lead, Lipid Profile, Hepatic function panel, Basic Metabolic Panel (BMET) Blood pressure is well controlled on today's visit. No changes made to the medications.  Persistent atrial fibrillation (HCC) - Plan: EKG 12-Lead, Lipid Profile, Hepatic function panel, Basic Metabolic Panel (BMET)  rate well controlled, tolerating anticoagulation, discussed with him  He does not have any symptoms, appears to be atrial flutter on today's visit   Cardiomyopathy, ischemic - Plan: EKG 12-Lead, Lipid Profile, Hepatic function panel, Basic Metabolic Panel (BMET)  Hyperlipidemia, unspecified hyperlipidemia type - Plan: EKG 12-Lead,  Lipid Profile, Hepatic function panel, Basic Metabolic Panel (BMET)  we have placed an order for lipid panel, basic metabolic panel, LFTs   Total encounter time more than 25 minutes  Greater than 50% was spent in counseling and coordination of care with the patient   Disposition:   F/U  6 months   No orders of the defined types were placed in this encounter.    Signed, Dossie Arbour, M.D., Ph.D. 12/29/2016  [facility] [place], [place] [phone]

## 2016-12-30 ENCOUNTER — Ambulatory Visit: Payer: Medicare HMO | Admitting: Cardiovascular Disease

## 2017-01-01 ENCOUNTER — Telehealth: Payer: Self-pay | Admitting: *Deleted

## 2017-01-01 NOTE — Telephone Encounter (Signed)
Patient stopped by the office and requested that you refill a medication that Dr. Terri Piedra has been filling for him for his itching. Form and copy of label on your desk.

## 2017-01-02 MED ORDER — TRIAMCINOLONE ACETONIDE 0.1 % EX CREA: TOPICAL_CREAM | Freq: Every day | CUTANEOUS | 2 refills | Status: DC | PRN

## 2017-01-02 NOTE — Telephone Encounter (Signed)
Pt called to check status of prescription. I told him it was sent over this morning but he would like a cb to confirm.

## 2017-01-02 NOTE — Telephone Encounter (Signed)
Sent. Thanks.   

## 2017-01-10 ENCOUNTER — Other Ambulatory Visit: Payer: Self-pay

## 2017-01-10 ENCOUNTER — Telehealth: Payer: Self-pay | Admitting: Cardiovascular Disease

## 2017-01-10 MED ORDER — ROSUVASTATIN CALCIUM 10 MG PO TABS: 10.0000 mg | ORAL_TABLET | Freq: Every day | ORAL | 3 refills | Status: DC

## 2017-01-10 NOTE — Telephone Encounter (Signed)
°*  STAT* If patient is at the pharmacy, call can be transferred to refill team.   1. Which medications need to be refilled? (please list name of each medication and dose if known)  Rosuvastatin 10 mg   2. Which pharmacy/location (including street and city if local pharmacy) is medication to be sent to? [facility] [address]   3. Do they need a 30 day or 90 day supply? 30 day

## 2017-01-10 NOTE — Telephone Encounter (Signed)
Requested Prescriptions   Signed Prescriptions Disp Refills  . rosuvastatin (CRESTOR) 10 MG tablet 30 tablet 3    Sig: Take 1 tablet (10 mg total) by mouth daily.    Authorizing Provider: Antonieta Iba    Ordering User: Margrett Rud

## 2017-01-10 NOTE — Telephone Encounter (Signed)
Requested Prescriptions   Signed Prescriptions Disp Refills  . rosuvastatin (CRESTOR) 10 MG tablet 30 tablet 3    Sig: Take 1 tablet (10 mg total) by mouth daily.    Authorizing Provider: GOLLAN, TIMOTHY J    Ordering User: Leslee Suire N    

## 2017-01-21 ENCOUNTER — Other Ambulatory Visit: Payer: Self-pay | Admitting: *Deleted

## 2017-01-21 MED ORDER — ROSUVASTATIN CALCIUM 10 MG PO TABS: 10.0000 mg | ORAL_TABLET | Freq: Every day | ORAL | 3 refills | Status: DC

## 2017-01-26 NOTE — Progress Notes (Deleted)
Cardiology Office Note  Date:  01/26/2017   ID:  Devin Ramsey, Devin Ramsey 19-Aug-1945, MRN [id number]  PCP:  [name], MD   No chief complaint on file.   HPI:  Devin Ramsey is a 71 year old gentleman who presented to the hospital 06/2014 with bronchitis symptoms,  EKG with new atrial fibrillation,  chest x-ray showing pneumonia who was kept in the hospital for 5 days,  COPD exacerbation.   cardioversion 08/23/2014 for A. fib flutter,  Previous echocardiogram showed ejection fraction 35-40%, February 2016 in the setting of atrial fibrillation Stopped smoking 06/2014, wife still smokes He follows up today to discuss atrial fibrillation management  In follow-up today he reports that he feels well  Denies any chest pain, stable mild shortness of breath on exertion  No regular exercise program  Biggest complaint is severe knee pain  Recent x-ray of left knee shows moderate degenerative change  Tried new shoes, seem to make his symptoms worse  Wanted to know if he could take oral prednisone for his knee pain  Blood pressure running low but denies any lightheadedness Tolerating eliquis 5 BID On entresto BID  Discussed his low ejection fraction with him, he does not want any ischemia workup as he feels well   ejection fraction 35%   EKG with atrial flutter, rate 68 bpm,LBBB  Other past medical history EKG in January 2017 showing atrial fibrillation Prior EKG in October and November 2016 shows normal sinus rhythm with left bundle branch block  Total chol 216, LDL 126  previous Ejection fraction remains low at 35-40%, no significant improvement by restoring normal sinus rhythm  Lab work from the hospital showed sodium 132, potassium 5.1, hemoglobin A1c 5.3, negative cardiac enzymes, Total cholesterol   Chest x-ray with interstitial pulmonary edema, small bilateral pleural effusions  Echocardiogram from 06/29/2014 showing ejection fraction 35-40%, septal motion  consistent with bundle branch block, normal right ventricular systolic pressure, normal left atrial size   PMH:   has a past medical history of A-fib (HCC); Acute systolic CHF (congestive heart failure) (HCC); Bilateral pleural effusion; COPD (chronic obstructive pulmonary disease) (HCC); Gout; Pneumonia (2016); Thyroid disease; and Tobacco abuse.  PSH:    Past Surgical History:  Procedure Laterality Date  . HEMORRHOID SURGERY    . SHOULDER SURGERY     right shoulder   . TONSILLECTOMY      Current Outpatient Prescriptions  Medication Sig Dispense Refill  . apixaban (ELIQUIS) 5 MG TABS tablet Take 1 tablet (5 mg total) by mouth 2 (two) times daily. 180 tablet 3  . clobetasol cream (TEMOVATE) 0.05 % Apply 1 application topically 2 (two) times daily as needed. 45 g 1  . cyclobenzaprine (FLEXERIL) 10 MG tablet Take 0.5-1 tablets (5-10 mg total) by mouth 3 (three) times daily as needed for muscle spasms. Sedation caution 30 tablet 1  . furosemide (LASIX) 20 MG tablet Take 1 tablet (20 mg total) by mouth daily as needed. 90 tablet 3  . hydrOXYzine (ATARAX/VISTARIL) 10 MG tablet daily as needed.     Marland Kitchen levothyroxine (SYNTHROID, LEVOTHROID) 25 MCG tablet Take 2 tablets (50 mcg total) by mouth daily before breakfast. 60 tablet 5  . metoprolol succinate (TOPROL XL) 25 MG 24 hr tablet Take 1 tablet (25 mg total) by mouth daily. 90 tablet 3  . potassium chloride SA (KLOR-CON M20) 20 MEQ tablet Take 0.5 tablets (10 mEq total) by mouth daily as needed (with lasix).    . predniSONE (DELTASONE) 20 MG tablet  Take 1 tablet (20 mg total) by mouth daily as needed. For gout. 10 tablet 11  . rosuvastatin (CRESTOR) 10 MG tablet Take 1 tablet (10 mg total) by mouth daily. 90 tablet 3  . sacubitril-valsartan (ENTRESTO) 24-26 MG Take 1 tablet by mouth 2 (two) times daily. 180 tablet 3  . tiotropium (SPIRIVA HANDIHALER) 18 MCG inhalation capsule Place 1 capsule (18 mcg total) into inhaler and inhale daily. 30  capsule 12  . traMADol (ULTRAM) 50 MG tablet TAKE TWO TABLETS BY MOUTH EVERY 12 HOURS AS NEEDED KNEE  PAIN 120 tablet 2  . triamcinolone cream (KENALOG) 0.1 % Apply topically daily as needed. 454 g 2   No current facility-administered medications for this visit.      Allergies:   Codeine   Social History:  The patient  reports that he quit smoking about 2 years ago. His smoking use included Cigarettes. He quit after 55.00 years of use. He has never used smokeless tobacco. He reports that he drinks alcohol. He reports that he does not use drugs.   Family History:   He was adopted. Family history is unknown by patient.    Review of Systems: Review of Systems  Constitutional: Negative.   Respiratory: Positive for shortness of breath.   Cardiovascular: Negative.   Gastrointestinal: Negative.   Musculoskeletal: Positive for joint pain.  Neurological: Negative.   Psychiatric/Behavioral: Negative.   All other systems reviewed and are negative.    PHYSICAL EXAM: VS:  There were no vitals taken for this visit. , BMI There is no height or weight on file to calculate BMI. GEN: Well nourished, well developed, in no acute distress  HEENT: normal  Neck: no JVD, carotid bruits, or masses Cardiac: RRR; no murmurs, rubs, or gallops,no edema  Respiratory:  Moderately decreased breath sounds throughout otherwise clear, normal work of breathing GI: soft, nontender, nondistended, + BS MS: no deformity or atrophy  Skin: warm and dry, no rash Neuro:  Strength and sensation are intact Psych: euthymic mood, full affect    Recent Labs: 07/18/2016: ALT 10; BUN 5; Creatinine, Ser 0.87; Potassium 4.3; Sodium 141    Lipid Panel Lab Results  Component Value Date   CHOL 116 07/18/2016   HDL 41 07/18/2016   LDLCALC 34 07/18/2016   TRIG 205 (H) 07/18/2016      Wt Readings from Last 3 Encounters:  10/03/16 179 lb 4 oz (81.3 kg)  07/17/16 175 lb 8 oz (79.6 kg)  07/02/16 174 lb (78.9 kg)        ASSESSMENT AND PLAN:  Congestive dilated cardiomyopathy (HCC) - Plan: EKG 12-Lead, Lipid Profile, Hepatic function panel, Basic Metabolic Panel (BMET) We'll continue on current medication regiment, on beta blocker, entresto low blood pressure, unable to advance his medications. Potentially could add Aldactone Currently on Lasix daily as needed for weight gain or leg swelling BMP ordered  Chronic systolic CHF (congestive heart failure) (HCC) - Plan: EKG 12-Lead, Lipid Profile, Hepatic function panel, Basic Metabolic Panel (BMET)  appears euvolemic on today's visit, recommended he monitor his weight   Essential hypertension - Plan: EKG 12-Lead, Lipid Profile, Hepatic function panel, Basic Metabolic Panel (BMET) Blood pressure is well controlled on today's visit. No changes made to the medications.  Persistent atrial fibrillation (HCC) - Plan: EKG 12-Lead, Lipid Profile, Hepatic function panel, Basic Metabolic Panel (BMET)  rate well controlled, tolerating anticoagulation, discussed with him  He does not have any symptoms, appears to be atrial flutter on today's visit  Cardiomyopathy, ischemic - Plan: EKG 12-Lead, Lipid Profile, Hepatic function panel, Basic Metabolic Panel (BMET)  Hyperlipidemia, unspecified hyperlipidemia type - Plan: EKG 12-Lead, Lipid Profile, Hepatic function panel, Basic Metabolic Panel (BMET)  we have placed an order for lipid panel, basic metabolic panel, LFTs   Total encounter time more than 25 minutes  Greater than 50% was spent in counseling and coordination of care with the patient   Disposition:   F/U  6 months   No orders of the defined types were placed in this encounter.    Signed, Dossie Arbour, M.D., Ph.D. 01/26/2017  [facility] [place], [place] [phone]

## 2017-01-27 ENCOUNTER — Ambulatory Visit: Payer: Medicare HMO | Admitting: Cardiovascular Disease

## 2017-02-11 ENCOUNTER — Ambulatory Visit: Payer: Medicare HMO | Admitting: Internal Medicine

## 2017-02-11 ENCOUNTER — Ambulatory Visit: Payer: Medicare HMO | Admitting: Family Medicine

## 2017-02-13 ENCOUNTER — Ambulatory Visit: Payer: Medicare HMO | Admitting: Family Medicine

## 2017-02-17 ENCOUNTER — Encounter: Payer: Self-pay | Admitting: Family Medicine

## 2017-02-17 ENCOUNTER — Ambulatory Visit (INDEPENDENT_AMBULATORY_CARE_PROVIDER_SITE_OTHER): Payer: Medicare HMO | Admitting: Family Medicine

## 2017-02-17 VITALS — BP 110/56 | HR 75 | Temp 97.6°F | Wt 181.0 lb

## 2017-02-17 DIAGNOSIS — Z23 Encounter for immunization: Secondary | ICD-10-CM

## 2017-02-17 DIAGNOSIS — M25569 Pain in unspecified knee: Secondary | ICD-10-CM | POA: Diagnosis not present

## 2017-02-17 DIAGNOSIS — E039 Hypothyroidism, unspecified: Secondary | ICD-10-CM | POA: Diagnosis not present

## 2017-02-17 LAB — TSH: TSH: 1.15 u[IU]/mL (ref 0.35–4.50)

## 2017-02-17 MED ORDER — CLOBETASOL PROPIONATE 0.05 % EX CREA: 1.0000 "application " | TOPICAL_CREAM | Freq: Two times a day (BID) | CUTANEOUS | 2 refills | Status: DC | PRN

## 2017-02-17 MED ORDER — TRAMADOL HCL 50 MG PO TABS: ORAL_TABLET | ORAL | 5 refills | Status: DC

## 2017-02-17 NOTE — Progress Notes (Signed)
Due for flu shot.  Done at OV.    Knee pain continues.  Has a knot on the L medial knee from prior injury years ago.  Still painful in the interval.  Pain with weight bearing.  Tramadol helps some.  He is function with that, w/o ADE.  No constipation.    Prev xray with: Moderate degenerative change.  He wanted a rx for clobetasol to use if needed for dermatitis.  Routine steroid cautions d/w pt.  prescription given to patient at office visit.  Due for f/u TSH.  D/w pt. See notes on labs.    Meds, vitals, and allergies reviewed.   ROS: Per HPI unless specifically indicated in ROS section   nad ncat IRR,  noy tachy ctab abd soft Ext w/o edema No TMG in neck.  Left knee with chronic osteoarthritic changes noted on exam, especially on the medial side. Some crepitus on range of motion. Able to bear weight. Medial joint line nontender.

## 2017-02-17 NOTE — Assessment & Plan Note (Signed)
Imaging discussed with patient. No adverse effect on tramadol. Continue as is. He agrees. Prescription printed and given to patient.

## 2017-02-17 NOTE — Patient Instructions (Addendum)
Use the tramadol as needed for knee pain.  Use the least amount of clobetasol possible. Take care.  Glad to see you.  Update me as needed.  Go to the lab on the way out.  We'll contact you with your lab report.

## 2017-02-17 NOTE — Assessment & Plan Note (Signed)
Recheck TSH today. See notes on labs.

## 2017-02-18 ENCOUNTER — Other Ambulatory Visit: Payer: Self-pay | Admitting: *Deleted

## 2017-02-18 MED ORDER — LEVOTHYROXINE SODIUM 50 MCG PO TABS: 50.0000 ug | ORAL_TABLET | Freq: Every day | ORAL | 3 refills | Status: DC

## 2017-02-28 ENCOUNTER — Ambulatory Visit: Payer: Medicare HMO | Admitting: Family Medicine

## 2017-03-03 NOTE — Progress Notes (Addendum)
Cardiology Office Note  Date:  03/04/2017   ID:  Ramonte, Fecteau 1946/03/17, MRN [id number]  PCP:  [name], MD   Chief Complaint  Patient presents with  . other    6 month follow up. Patient has a cut on left arm that will not heal and a rash on stomach area. Patient denies cehst pain and SOB. Meds reviewed verbally with patient.     HPI:  Mr. Bussen is a 71 year old gentleman with past medical history of Moderate diffuse aortic atherosclerosis on CT 2016 hospital 06/2014 with bronchitis symptoms,  Permanent atrial fibrillation,  chest x-ray showing pneumonia who was kept in the hospital for 5 days,  COPD exacerbation. cardioversion 08/23/2014 for A. fib flutter,  Previous echocardiogram showed ejection fraction 35-40%, February 2016 in the setting of atrial fibrillation Stopped smoking 06/28/2014, wife still smokes He has previously declined ischemia workup He follows up today to discuss atrial fibrillation management  Rash with triamcinilone cream Flanks left worse than right Shoulder itching sores Skin exeriation left arm forearm  Denies any significant change in his shortness of breath symptoms No chest pain concerning for angina No regular exercise program  Biggest complaint is knee pain, difficulty walking x-ray of left knee shows moderate degenerative change   No orthostasis symptoms Tolerating eliquis 5 BID On entresto BID   ejection fraction 35%   EKG personally reviewed by myself showing atrial flutter, rate 74 bpm,LBBB  Other past medical history EKG in January 2017 showing atrial fibrillation Prior EKG in October and November 2016 shows normal sinus rhythm with left bundle branch block  Total chol 216, LDL 126  previous Ejection fraction remains low at 35-40%, no significant improvement by restoring normal sinus rhythm  Lab work from the hospital showed sodium 132, potassium 5.1, hemoglobin A1c 5.3, negative cardiac enzymes, Total  cholesterol   Chest x-ray with interstitial pulmonary edema, small bilateral pleural effusions  Echocardiogram from 06/29/2014 showing ejection fraction 35-40%, septal motion consistent with bundle branch block, normal right ventricular systolic pressure, normal left atrial size   PMH:   has a past medical history of A-fib (HCC); Acute systolic CHF (congestive heart failure) (HCC); Bilateral pleural effusion; COPD (chronic obstructive pulmonary disease) (HCC); Gout; Pneumonia (2016); Thyroid disease; and Tobacco abuse.  PSH:    Past Surgical History:  Procedure Laterality Date  . HEMORRHOID SURGERY    . SHOULDER SURGERY     right shoulder   . TONSILLECTOMY      Current Outpatient Prescriptions  Medication Sig Dispense Refill  . apixaban (ELIQUIS) 5 MG TABS tablet Take 1 tablet (5 mg total) by mouth 2 (two) times daily. 180 tablet 3  . clobetasol cream (TEMOVATE) 0.05 % Apply 1 application topically 2 (two) times daily as needed. Use the least amount possible. 45 g 2  . cyclobenzaprine (FLEXERIL) 10 MG tablet Take 0.5-1 tablets (5-10 mg total) by mouth 3 (three) times daily as needed for muscle spasms. Sedation caution 30 tablet 1  . furosemide (LASIX) 20 MG tablet Take 1 tablet (20 mg total) by mouth daily as needed. 90 tablet 3  . hydrOXYzine (ATARAX/VISTARIL) 10 MG tablet daily as needed.     Marland Kitchen levothyroxine (SYNTHROID, LEVOTHROID) 50 MCG tablet Take 1 tablet (50 mcg total) by mouth daily. 90 tablet 3  . metoprolol succinate (TOPROL XL) 25 MG 24 hr tablet Take 1 tablet (25 mg total) by mouth daily. 90 tablet 3  . potassium chloride SA (KLOR-CON M20) 20 MEQ tablet  Take 0.5 tablets (10 mEq total) by mouth daily as needed (with lasix).    . predniSONE (DELTASONE) 20 MG tablet Take 1 tablet (20 mg total) by mouth daily as needed. For gout. 10 tablet 11  . rosuvastatin (CRESTOR) 10 MG tablet Take 1 tablet (10 mg total) by mouth daily. 90 tablet 3  . sacubitril-valsartan (ENTRESTO)  24-26 MG Take 1 tablet by mouth 2 (two) times daily. 180 tablet 3  . tiotropium (SPIRIVA HANDIHALER) 18 MCG inhalation capsule Place 1 capsule (18 mcg total) into inhaler and inhale daily. 30 capsule 12  . traMADol (ULTRAM) 50 MG tablet TAKE TWO TABLETS BY MOUTH EVERY 12 HOURS AS NEEDED KNEE  PAIN 120 tablet 5  . triamcinolone cream (KENALOG) 0.1 % Apply topically daily as needed. 454 g 2   No current facility-administered medications for this visit.      Allergies:   Codeine   Social History:  The patient  reports that he quit smoking about 2 years ago. His smoking use included Cigarettes. He quit after 55.00 years of use. He has never used smokeless tobacco. He reports that he drinks alcohol. He reports that he does not use drugs.   Family History:   He was adopted. Family history is unknown by patient.    Review of Systems: Review of Systems  Constitutional: Negative.   Respiratory: Positive for shortness of breath.   Cardiovascular: Negative.   Gastrointestinal: Negative.   Musculoskeletal: Positive for joint pain.  Neurological: Negative.   Psychiatric/Behavioral: Negative.   All other systems reviewed and are negative.    PHYSICAL EXAM: VS:  BP 100/66 (BP Location: Right Arm, Patient Position: Sitting, Cuff Size: Normal)   Pulse 74   Ht  (1.778 m)   Wt 178 lb (80.7 kg)   BMI 25.54 kg/m  , BMI Body mass index is 25.54 kg/m. GEN: Well nourished, well developed, in no acute distress  HEENT: normal  Neck: no JVD, carotid bruits, or masses Cardiac: RRR; no murmurs, rubs, or gallops,no edema  Respiratory:  Moderately decreased breath sounds throughout otherwise clear, normal work of breathing GI: soft, nontender, nondistended, + BS MS: no deformity or atrophy  Skin: warm and dry, + rash left flank, shoulders bilaterally Skin excoriation left brachial area Neuro:  Strength and sensation are intact Psych: euthymic mood, full affect    Recent Labs: 07/18/2016: ALT  10; BUN 5; Creatinine, Ser 0.87; Potassium 4.3; Sodium 141 02/17/2017: TSH 1.15    Lipid Panel Lab Results  Component Value Date   CHOL 116 07/18/2016   HDL 41 07/18/2016   LDLCALC 34 07/18/2016   TRIG 205 (H) 07/18/2016      Wt Readings from Last 3 Encounters:  03/04/17 178 lb (80.7 kg)  02/17/17 181 lb (82.1 kg)  10/03/16 179 lb 4 oz (81.3 kg)       ASSESSMENT AND PLAN:  Congestive dilated cardiomyopathy (HCC) - Continue current medications Low blood pressure, unable to advance his medications Weight stable, Euvolemic  rash We will talk to pharmacist about his rash Unclear if it is a medication side effect  Chronic systolic CHF (congestive heart failure) (HCC) -  Symptomatically doing well, appears euvolemic, no drastic changes to his medications Blood pressure low with no orthostasis symptoms  Essential hypertension -  Blood pressure is well controlled on today's visit. No changes made to the medications.  Permanent atrial fibrillation (HCC) -  Rate controlled on anticoagulation  Hyperlipidemia Cholesterol is at goal on the current  lipid regimen. No changes to the medications were made.   Total encounter time more than 25 minutes  Greater than 50% was spent in counseling and coordination of care with the patient   Disposition:   F/U  6 months   Orders Placed This Encounter  Procedures  . EKG 12-Lead     Signed, Dossie Arbour, M.D., Ph.D. 03/04/2017  [facility] [place], [place] [phone]

## 2017-03-04 ENCOUNTER — Ambulatory Visit (INDEPENDENT_AMBULATORY_CARE_PROVIDER_SITE_OTHER): Payer: Medicare HMO | Admitting: Cardiovascular Disease

## 2017-03-04 ENCOUNTER — Encounter: Payer: Self-pay | Admitting: Cardiovascular Disease

## 2017-03-04 VITALS — BP 100/66 | HR 74 | Ht 70.0 in | Wt 178.0 lb

## 2017-03-04 DIAGNOSIS — I420 Dilated cardiomyopathy: Secondary | ICD-10-CM

## 2017-03-04 DIAGNOSIS — J441 Chronic obstructive pulmonary disease with (acute) exacerbation: Secondary | ICD-10-CM | POA: Diagnosis not present

## 2017-03-04 DIAGNOSIS — J449 Chronic obstructive pulmonary disease, unspecified: Secondary | ICD-10-CM

## 2017-03-04 DIAGNOSIS — I5022 Chronic systolic (congestive) heart failure: Secondary | ICD-10-CM

## 2017-03-04 DIAGNOSIS — I481 Persistent atrial fibrillation: Secondary | ICD-10-CM

## 2017-03-04 DIAGNOSIS — I10 Essential (primary) hypertension: Secondary | ICD-10-CM | POA: Diagnosis not present

## 2017-03-04 DIAGNOSIS — I4819 Other persistent atrial fibrillation: Secondary | ICD-10-CM

## 2017-03-04 NOTE — Patient Instructions (Signed)

## 2017-04-15 ENCOUNTER — Telehealth: Payer: Self-pay | Admitting: Family Medicine

## 2017-04-15 NOTE — Telephone Encounter (Signed)
Copied from CRM [id number]. Topic: Quick Communication - See Telephone Encounter >> Apr 15, 2017  3:27 PM Cipriano Bunker wrote: CRM for notification. See Telephone encounter for:  Needing prescription for Triamcinolone Acetonide refill. Call into [facility] on [address]  He ask for someone to call when sent to [facility] 04/15/17.

## 2017-04-15 NOTE — Telephone Encounter (Signed)
Refill request for triamcinolone cream / LOV 02/17/2017 with Dr. Para March / Pharmacy verified as the [facility] on [address] in [place] /

## 2017-04-16 MED ORDER — TRIAMCINOLONE ACETONIDE 0.1 % EX CREA: TOPICAL_CREAM | Freq: Every day | CUTANEOUS | 2 refills | Status: DC | PRN

## 2017-04-16 NOTE — Telephone Encounter (Signed)
Sent. Thanks.   

## 2017-04-21 ENCOUNTER — Telehealth: Payer: Self-pay | Admitting: Cardiovascular Disease

## 2017-04-21 MED ORDER — APIXABAN 5 MG PO TABS: 5.0000 mg | ORAL_TABLET | Freq: Two times a day (BID) | ORAL | 3 refills | Status: DC

## 2017-04-21 NOTE — Telephone Encounter (Signed)
°*  STAT* If patient is at the pharmacy, call can be transferred to refill team.   1. Which medications need to be refilled? (please list name of each medication and dose if known)  eliquis 5 mg   2. Which pharmacy/location (including street and city if local pharmacy) is medication to be sent to? [facility] on [address]  3. Do they need a 30 day or 90 day supply? 90 day

## 2017-04-21 NOTE — Telephone Encounter (Signed)
Eliquis refilled.  

## 2017-04-21 NOTE — Telephone Encounter (Signed)
Review for refill. 

## 2017-05-26 ENCOUNTER — Other Ambulatory Visit: Payer: Self-pay

## 2017-05-26 MED ORDER — SACUBITRIL-VALSARTAN 24-26 MG PO TABS: 1.0000 | ORAL_TABLET | Freq: Two times a day (BID) | ORAL | 3 refills | Status: DC

## 2017-06-03 ENCOUNTER — Ambulatory Visit: Payer: Medicare HMO | Admitting: Family Medicine

## 2017-06-05 ENCOUNTER — Ambulatory Visit (INDEPENDENT_AMBULATORY_CARE_PROVIDER_SITE_OTHER): Payer: Medicare HMO | Admitting: Family Medicine

## 2017-06-05 ENCOUNTER — Encounter: Payer: Self-pay | Admitting: Family Medicine

## 2017-06-05 DIAGNOSIS — L853 Xerosis cutis: Secondary | ICD-10-CM

## 2017-06-05 MED ORDER — LORATADINE 10 MG PO TABS: 10.0000 mg | ORAL_TABLET | Freq: Every day | ORAL | 3 refills | Status: DC

## 2017-06-05 MED ORDER — HYDROXYZINE HCL 10 MG PO TABS: 5.0000 mg | ORAL_TABLET | Freq: Three times a day (TID) | ORAL | 3 refills | Status: DC | PRN

## 2017-06-05 MED ORDER — LORATADINE 10 MG PO TABS: 10.0000 mg | ORAL_TABLET | Freq: Every day | ORAL | Status: DC

## 2017-06-05 NOTE — Patient Instructions (Addendum)
Use over the counter hand cream on dry skin.   Start taking 10mg  a of loratadine (claritin) a day for itching.  If still itching, then try hydroxyzine pills.  Start with half a tab.  If still with an itchy spot, use the triamcinolone cream.  Don't take really hot showers.  Update me as needed.  Take care.  Glad to see you.

## 2017-06-05 NOTE — Progress Notes (Signed)
Rash and spots came up on his back.  Locally scabbed.  Itching.  No fevers.  Not SOB.  Using spiriva at baseline.  No new meds.  Using prednisone prn for gout, not daily.  Minimal BLE edema.  Controlled with PRN use of lasix.    Meds, vitals, and allergies reviewed.   ROS: Per HPI unless specifically indicated in ROS section   nad ncat rrr ctab abd soft, not ttp Ext w/ 1+ BLE edema.  Dry skin noted on the torso, anterior and dorsally, he has some excoriated patches, some of which have small scabs, no spreading erythema.

## 2017-06-06 DIAGNOSIS — L853 Xerosis cutis: Secondary | ICD-10-CM | POA: Insufficient documentation

## 2017-06-06 NOTE — Assessment & Plan Note (Signed)
This looks to be an issue of dry skin, with excoriation, with some scabbing.  Does not appear to be an acute infectious process. Use over the counter hand cream on dry skin.   Start taking 10mg  a of loratadine (claritin) a day for itching.  If still itching, then try hydroxyzine pills.  Start with half a tab.  If still with an itchy spot, use the triamcinolone cream.  Avoid really hot showers.  Update me as needed. He agrees.

## 2017-06-16 ENCOUNTER — Telehealth: Payer: Self-pay | Admitting: Cardiovascular Disease

## 2017-06-16 ENCOUNTER — Other Ambulatory Visit: Payer: Self-pay

## 2017-06-16 MED ORDER — METOPROLOL SUCCINATE ER 25 MG PO TB24: 25.0000 mg | ORAL_TABLET | Freq: Every day | ORAL | 3 refills | Status: DC

## 2017-06-16 NOTE — Telephone Encounter (Signed)
Requested Prescriptions   Signed Prescriptions Disp Refills  . metoprolol succinate (TOPROL XL) 25 MG 24 hr tablet 90 tablet 3    Sig: Take 1 tablet (25 mg total) by mouth daily.    Authorizing Provider: GOLLAN, TIMOTHY J    Ordering User: Madine Sarr N    

## 2017-06-16 NOTE — Telephone Encounter (Signed)
°*  STAT* If patient is at the pharmacy, call can be transferred to refill team. ° ° °1. Which medications need to be refilled? (please list name of each medication and dose if known) Metoprolol  ° °2. Which pharmacy/location (including street and city if local pharmacy) is medication to be sent to? [facility] [address] ° °3. Do they need a 30 day or 90 day supply? 90 day  ° ° °

## 2017-06-27 ENCOUNTER — Ambulatory Visit: Payer: Medicare HMO | Admitting: Family Medicine

## 2017-07-07 ENCOUNTER — Telehealth: Payer: Self-pay | Admitting: Cardiovascular Disease

## 2017-07-07 NOTE — Telephone Encounter (Signed)
Fax received from [employer] ([address] that the patient's crestor is no covered on his [employer].   Will forward to Dr. [name] to review. I can't see that he has any intolerance to other statins or that he has been on any others.

## 2017-07-08 NOTE — Telephone Encounter (Signed)
Okay to change Crestor to Lipitor 20 daily

## 2017-07-09 MED ORDER — ATORVASTATIN CALCIUM 20 MG PO TABS: 20.0000 mg | ORAL_TABLET | Freq: Every day | ORAL | 3 refills | Status: DC

## 2017-07-09 NOTE — Telephone Encounter (Signed)
Discontinued rosuvastatin.  Ordered atorvastatin 20 mg daily to [facility], [address]. Called patient and informed of medication change.  He is appreciative for assistance.

## 2017-08-05 ENCOUNTER — Ambulatory Visit (INDEPENDENT_AMBULATORY_CARE_PROVIDER_SITE_OTHER): Payer: Medicare Other | Admitting: Family Medicine

## 2017-08-05 ENCOUNTER — Telehealth: Payer: Self-pay | Admitting: Cardiovascular Disease

## 2017-08-05 ENCOUNTER — Telehealth: Payer: Self-pay

## 2017-08-05 ENCOUNTER — Encounter: Payer: Self-pay | Admitting: Family Medicine

## 2017-08-05 VITALS — BP 126/64 | HR 70 | Temp 97.3°F | Ht 70.0 in | Wt 181.5 lb

## 2017-08-05 DIAGNOSIS — R21 Rash and other nonspecific skin eruption: Secondary | ICD-10-CM | POA: Diagnosis not present

## 2017-08-05 MED ORDER — LORATADINE 10 MG PO TABS: 10.0000 mg | ORAL_TABLET | Freq: Every day | ORAL | 3 refills | Status: DC

## 2017-08-05 MED ORDER — HYDROXYZINE HCL 10 MG PO TABS: 5.0000 mg | ORAL_TABLET | Freq: Three times a day (TID) | ORAL | 3 refills | Status: DC | PRN

## 2017-08-05 NOTE — Telephone Encounter (Signed)
Called patient. He is currently at his PCP at [facility] to be seen. Advised that was good that he went there for treatment.

## 2017-08-05 NOTE — Telephone Encounter (Signed)
I will see him then

## 2017-08-05 NOTE — Telephone Encounter (Signed)
Pt walked in; lt forearm from elbow to wrist red, warm to touch but pt has on flannel shirt and longjohns. Pt said rash started this morning;triamcinolone cream helps the itch for short period. Last time happened was 2 mths ago. No difficulty breathing or swelling. Pt scheduled appt to see Dr Milinda Antis 08/05/17 at 3PM. If pt condition worsens prior to appt pt will go to [facility] or ED . Pt voiced understanding. FYI to Dr Milinda Antis.

## 2017-08-05 NOTE — Progress Notes (Signed)
Subjective:    Patient ID: Devin Ramsey, male    DOB: [birth year], 72 y.o.   MRN: [id number]  HPI Here for rash on L forearm  Red and itchy since this am   This am - started with L arm itching-mild  Then used triamcinolone- itching improved and then it became much more red  No pain in arm No fever  It is warm to the touch  Feels ok    He was out of loratadine and also hydroxyzine recently  5 days out of it  He usually takes both every day   Uses dove soap  Avoids hot water    Breathing has been pretty good lately- uses spiriva   He was last seen by PCP for dry skin 1/19 -more patchy skin  rec loratadine/ hydroxyzine/triamcinolone and moisturizers  Hs clobetasol cream on his list -from rash over a year ago (uses that rarely for poison oak)    He is on multiple medications for cardiomyopathy and CHF   Patient Active Problem List   Diagnosis Date Noted  . Dry skin 06/06/2017  . COPD (chronic obstructive pulmonary disease) (HCC) 10/03/2016  . Neck pain 10/03/2016  . Rash and nonspecific skin eruption 05/06/2016  . Olecranon bursitis 03/07/2016  . Axillary pain 02/02/2016  . Knee pain 01/08/2016  . Primary osteoarthritis of left knee 01/04/2016  . Cardiomyopathy, ischemic 01/04/2016  . Hyperlipidemia 01/04/2016  . Hypothyroidism 11/19/2015  . Advance care planning 11/19/2015  . Atrial fibrillation (HCC) 06/29/2015  . Biceps tendon rupture 03/24/2015  . COPD with acute exacerbation (HCC) 07/11/2014  . Congestive dilated cardiomyopathy (HCC) 07/11/2014  . Chronic systolic CHF (congestive heart failure) (HCC) 07/11/2014  . Essential hypertension 07/11/2014   Past Medical History:  Diagnosis Date  . A-fib (HCC)   . Acute systolic CHF (congestive heart failure) (HCC)    EF 35% to 40%  . Bilateral pleural effusion   . COPD (chronic obstructive pulmonary disease) (HCC)   . Gout   . Pneumonia 2016  . Thyroid disease   . Tobacco abuse    Past Surgical History:    Procedure Laterality Date  . HEMORRHOID SURGERY    . SHOULDER SURGERY     right shoulder   . TONSILLECTOMY     Social History   Tobacco Use  . Smoking status: Former Smoker    Years: 55.00    Types: Cigarettes    Last attempt to quit: 06/27/2014    Years since quitting: 3.1  . Smokeless tobacco: Never Used  Substance Use Topics  . Alcohol use: Yes    Comment: 2-3 beers a day average  . Drug use: No   Family History  Adopted: Yes  Family history unknown: Yes   Allergies  Allergen Reactions  . Codeine     Sedation.  Tolerates other opiates.    . Indocin [Indomethacin]     Elevated BP, would avoid given his h/o CHF   Current Outpatient Medications on File Prior to Visit  Medication Sig Dispense Refill  . apixaban (ELIQUIS) 5 MG TABS tablet Take 1 tablet (5 mg total) by mouth 2 (two) times daily. 180 tablet 3  . atorvastatin (LIPITOR) 20 MG tablet Take 1 tablet (20 mg total) by mouth daily. 90 tablet 3  . clobetasol cream (TEMOVATE) 0.05 % Apply 1 application topically 2 (two) times daily as needed. Use the least amount possible. 45 g 2  . cyclobenzaprine (FLEXERIL) 10 MG tablet Take 0.5-1 tablets (5-10  mg total) by mouth 3 (three) times daily as needed for muscle spasms. Sedation caution 30 tablet 1  . furosemide (LASIX) 20 MG tablet Take 1 tablet (20 mg total) by mouth daily as needed. 90 tablet 3  . levothyroxine (SYNTHROID, LEVOTHROID) 50 MCG tablet Take 1 tablet (50 mcg total) by mouth daily. 90 tablet 3  . metoprolol succinate (TOPROL XL) 25 MG 24 hr tablet Take 1 tablet (25 mg total) by mouth daily. 90 tablet 3  . potassium chloride SA (KLOR-CON M20) 20 MEQ tablet Take 0.5 tablets (10 mEq total) by mouth daily as needed (with lasix).    . predniSONE (DELTASONE) 20 MG tablet Take 1 tablet (20 mg total) by mouth daily as needed. For gout. 10 tablet 11  . sacubitril-valsartan (ENTRESTO) 24-26 MG Take 1 tablet by mouth 2 (two) times daily. 180 tablet 3  . tiotropium (SPIRIVA  HANDIHALER) 18 MCG inhalation capsule Place 1 capsule (18 mcg total) into inhaler and inhale daily. 30 capsule 12  . traMADol (ULTRAM) 50 MG tablet TAKE TWO TABLETS BY MOUTH EVERY 12 HOURS AS NEEDED KNEE  PAIN 120 tablet 5  . triamcinolone cream (KENALOG) 0.1 % Apply topically daily as needed. 454 g 2   No current facility-administered medications on file prior to visit.     Review of Systems  Constitutional: Negative for activity change, appetite change, fatigue, fever and unexpected weight change.  HENT: Negative for congestion, rhinorrhea, sore throat and trouble swallowing.   Eyes: Negative for pain, redness, itching and visual disturbance.  Respiratory: Negative for cough, chest tightness, shortness of breath and wheezing.   Cardiovascular: Negative for chest pain and palpitations.  Gastrointestinal: Negative for abdominal pain, blood in stool, constipation, diarrhea and nausea.  Endocrine: Negative for cold intolerance, heat intolerance, polydipsia and polyuria.  Genitourinary: Negative for difficulty urinating, dysuria, frequency and urgency.  Musculoskeletal: Negative for arthralgias, joint swelling and myalgias.  Skin: Positive for rash. Negative for pallor.       Itching and dry skin  Neurological: Negative for dizziness, tremors, weakness, numbness and headaches.  Hematological: Negative for adenopathy. Does not bruise/bleed easily.  Psychiatric/Behavioral: Negative for decreased concentration and dysphoric mood. The patient is not nervous/anxious.        Objective:   Physical Exam  Constitutional: He appears well-developed and well-nourished. No distress.  Well appearing   HENT:  Head: Normocephalic and atraumatic.  Mouth/Throat: Oropharynx is clear and moist.  Eyes: Conjunctivae and EOM are normal. Pupils are equal, round, and reactive to light. Right eye exhibits no discharge. Left eye exhibits no discharge. No scleral icterus.  Neck: Normal range of motion. Neck supple.   Cardiovascular: Normal rate, regular rhythm and normal heart sounds.  Pulmonary/Chest: Effort normal and breath sounds normal. No respiratory distress. He has no wheezes. He has no rales.  Diffusely distant bs   Musculoskeletal: He exhibits no edema.  Lymphadenopathy:    He has no cervical adenopathy.  Neurological: He is alert. No cranial nerve deficit.  Skin: Skin is warm and dry. Rash noted. There is erythema.  L arm is erythematous with extensive ecchymosis/hematoma from scratching  (not warm or tender at all)  Skin is dry w/o lesions No skin breakdown  No oozing or d/c  No urticaria evident   Some papules with excoriation- abd/trunk as well   Lines on back evident from use of a back scratcher as well    Psychiatric: He has a normal mood and affect.  Assessment & Plan:   Problem List Items Addressed This Visit      Musculoskeletal and Integument   Rash and nonspecific skin eruption - Primary    Diffusely over R arm- occurring when he ran out of both antihistamines (claritin and hydroxyzine)  Erythema appears to be from scratching (ecchymosis and hematoma) -no s/s of infection Still no trigger known for itching Evidence of excoriation in many areas-disc cutting nails and stopping back scratcher immediately  Recommend re starting antihistamines (refilled today) Avoid hot water/harsh detergents Cleanse with dove soap Cool compresses  F/u if needed

## 2017-08-05 NOTE — Patient Instructions (Signed)
Take the loratadine 10 mg once daily  Take the hydroxyzine as needed  Stay cool (heat makes itch worse)   Please avoid second hand smoke! Have your family smoke outside please   Use moisturizers as needed  Use the triamcinolone cream as needed   Work hard not to scratch - also keep fingernails very short and filed so you do not injure your skin  Keep hands clean also   The redness on your arm looks like bruising from scratching   Also stop using the back scratcher -it is injuring your skin   Update [place] if symptoms do not improve

## 2017-08-05 NOTE — Assessment & Plan Note (Addendum)
Diffusely over R arm- occurring when he ran out of both antihistamines (claritin and hydroxyzine)  Erythema appears to be from scratching (ecchymosis and hematoma) -no s/s of infection Still no trigger known for itching Evidence of excoriation in many areas-disc cutting nails and stopping back scratcher immediately  Recommend re starting antihistamines (refilled today) Avoid hot water/harsh detergents Cleanse with dove soap Cool compresses  F/u if needed

## 2017-08-05 NOTE — Telephone Encounter (Signed)
Patient came to lobby c/o itchy rash on LUE that looks Red, describes as hot   Patient just started taking a new topical medication for itching   Please call

## 2017-08-19 ENCOUNTER — Other Ambulatory Visit: Payer: Self-pay | Admitting: Family Medicine

## 2017-08-19 NOTE — Telephone Encounter (Signed)
Last Rx 02/17/17 #120 5R. Last Ov 07/2017-acute. pls advise

## 2017-08-20 NOTE — Telephone Encounter (Signed)
Sent. Thanks.   

## 2017-08-30 NOTE — Progress Notes (Deleted)
Cardiology Office Note  Date:  08/30/2017   ID:  Devin Ramsey, Devin Ramsey [birth year], MRN [id number]  PCP:  [name], MD   No chief complaint on file.   HPI:  Mr. Gonnella is a 72 year old gentleman with past medical history of Moderate diffuse aortic atherosclerosis on CT 2016 hospital 06/2014 with bronchitis symptoms,  Permanent atrial fibrillation,  chest x-ray showing pneumonia who was kept in the hospital for 5 days,  COPD exacerbation. cardioversion 08/23/2014 for A. fib flutter,  Previous echocardiogram showed ejection fraction 35-40%, February 2016 in the setting of atrial fibrillation Stopped smoking 06/28/2014, wife still smokes He has previously declined ischemia workup He follows up today to discuss atrial fibrillation management  Rash with triamcinilone cream Flanks left worse than right Shoulder itching sores Skin exeriation left arm forearm  Denies any significant change in his shortness of breath symptoms No chest pain concerning for angina No regular exercise program  Biggest complaint is knee pain, difficulty walking x-ray of left knee shows moderate degenerative change   No orthostasis symptoms Tolerating eliquis 5 BID On entresto BID   ejection fraction 35%   EKG personally reviewed by myself showing atrial flutter, rate 74 bpm,LBBB  Other past medical history EKG in January 2017 showing atrial fibrillation Prior EKG in October and November 2016 shows normal sinus rhythm with left bundle branch block  Total chol 216, LDL 126  previous Ejection fraction remains low at 35-40%, no significant improvement by restoring normal sinus rhythm  Lab work from the hospital showed sodium 132, potassium 5.1, hemoglobin A1c 5.3, negative cardiac enzymes, Total cholesterol   Chest x-ray with interstitial pulmonary edema, small bilateral pleural effusions  Echocardiogram from 06/29/2014 showing ejection fraction 35-40%, septal motion consistent with  bundle branch block, normal right ventricular systolic pressure, normal left atrial size   PMH:   has a past medical history of A-fib (HCC), Acute systolic CHF (congestive heart failure) (HCC), Bilateral pleural effusion, COPD (chronic obstructive pulmonary disease) (HCC), Gout, Pneumonia (2016), Thyroid disease, and Tobacco abuse.  PSH:    Past Surgical History:  Procedure Laterality Date  . HEMORRHOID SURGERY    . SHOULDER SURGERY     right shoulder   . TONSILLECTOMY      Current Outpatient Medications  Medication Sig Dispense Refill  . apixaban (ELIQUIS) 5 MG TABS tablet Take 1 tablet (5 mg total) by mouth 2 (two) times daily. 180 tablet 3  . atorvastatin (LIPITOR) 20 MG tablet Take 1 tablet (20 mg total) by mouth daily. 90 tablet 3  . clobetasol cream (TEMOVATE) 0.05 % Apply 1 application topically 2 (two) times daily as needed. Use the least amount possible. 45 g 2  . cyclobenzaprine (FLEXERIL) 10 MG tablet Take 0.5-1 tablets (5-10 mg total) by mouth 3 (three) times daily as needed for muscle spasms. Sedation caution 30 tablet 1  . furosemide (LASIX) 20 MG tablet Take 1 tablet (20 mg total) by mouth daily as needed. 90 tablet 3  . hydrOXYzine (ATARAX/VISTARIL) 10 MG tablet Take 0.5-1 tablets (5-10 mg total) by mouth every 8 (eight) hours as needed for itching. 30 tablet 3  . levothyroxine (SYNTHROID, LEVOTHROID) 50 MCG tablet Take 1 tablet (50 mcg total) by mouth daily. 90 tablet 3  . loratadine (CLARITIN) 10 MG tablet Take 1 tablet (10 mg total) by mouth daily. 90 tablet 3  . metoprolol succinate (TOPROL XL) 25 MG 24 hr tablet Take 1 tablet (25 mg total) by mouth daily. 90 tablet 3  .  potassium chloride SA (KLOR-CON M20) 20 MEQ tablet Take 0.5 tablets (10 mEq total) by mouth daily as needed (with lasix).    . predniSONE (DELTASONE) 20 MG tablet Take 1 tablet (20 mg total) by mouth daily as needed. For gout. 10 tablet 11  . sacubitril-valsartan (ENTRESTO) 24-26 MG Take 1 tablet by  mouth 2 (two) times daily. 180 tablet 3  . tiotropium (SPIRIVA HANDIHALER) 18 MCG inhalation capsule Place 1 capsule (18 mcg total) into inhaler and inhale daily. 30 capsule 12  . traMADol (ULTRAM) 50 MG tablet TAKE 2 TABLETS BY MOUTH EVERY 12 HOURS AS NEEDED FOR  KNEE  PAIN 120 tablet 5  . triamcinolone cream (KENALOG) 0.1 % Apply topically daily as needed. 454 g 2   No current facility-administered medications for this visit.      Allergies:   Codeine and Indocin [indomethacin]   Social History:  The patient  reports that he quit smoking about 3 years ago. His smoking use included cigarettes. He quit after 55.00 years of use. He has never used smokeless tobacco. He reports that he drinks alcohol. He reports that he does not use drugs.   Family History:   He was adopted. Family history is unknown by patient.    Review of Systems: Review of Systems  Constitutional: Negative.   Respiratory: Positive for shortness of breath.   Cardiovascular: Negative.   Gastrointestinal: Negative.   Musculoskeletal: Positive for joint pain.  Neurological: Negative.   Psychiatric/Behavioral: Negative.   All other systems reviewed and are negative.    PHYSICAL EXAM: VS:  There were no vitals taken for this visit. , BMI There is no height or weight on file to calculate BMI. GEN: Well nourished, well developed, in no acute distress  HEENT: normal  Neck: no JVD, carotid bruits, or masses Cardiac: RRR; no murmurs, rubs, or gallops,no edema  Respiratory:  Moderately decreased breath sounds throughout otherwise clear, normal work of breathing GI: soft, nontender, nondistended, + BS MS: no deformity or atrophy  Skin: warm and dry, + rash left flank, shoulders bilaterally Skin excoriation left brachial area Neuro:  Strength and sensation are intact Psych: euthymic mood, full affect    Recent Labs: 02/17/2017: TSH 1.15    Lipid Panel Lab Results  Component Value Date   CHOL 116 07/18/2016    HDL 41 07/18/2016   LDLCALC 34 07/18/2016   TRIG 205 (H) 07/18/2016      Wt Readings from Last 3 Encounters:  08/05/17 181 lb 8 oz (82.3 kg)  06/05/17 177 lb 8 oz (80.5 kg)  03/04/17 178 lb (80.7 kg)       ASSESSMENT AND PLAN:  Congestive dilated cardiomyopathy (HCC) - Continue current medications Low blood pressure, unable to advance his medications Weight stable, Euvolemic  rash We will talk to pharmacist about his rash Unclear if it is a medication side effect  Chronic systolic CHF (congestive heart failure) (HCC) -  Symptomatically doing well, appears euvolemic, no drastic changes to his medications Blood pressure low with no orthostasis symptoms  Essential hypertension -  Blood pressure is well controlled on today's visit. No changes made to the medications.  Permanent atrial fibrillation (HCC) -  Rate controlled on anticoagulation  Hyperlipidemia Cholesterol is at goal on the current lipid regimen. No changes to the medications were made.   Total encounter time more than 25 minutes  Greater than 50% was spent in counseling and coordination of care with the patient   Disposition:   F/U  6 months   No orders of the defined types were placed in this encounter.    Signed, Dossie Arbour, M.D., Ph.D. 08/30/2017  [facility] [place], [place] [phone]

## 2017-09-01 ENCOUNTER — Ambulatory Visit: Payer: Medicare HMO | Admitting: Cardiovascular Disease

## 2017-09-01 ENCOUNTER — Telehealth: Payer: Self-pay | Admitting: Cardiovascular Disease

## 2017-09-01 NOTE — Telephone Encounter (Signed)
Patient wants to make sure new meds are ok to take .  Patient wants to check with Dr. [name] and had to r.s todays appt due to provider behind and unable to wait.    He is now taking Loratadine 10 mg po q day, hydroxyzine hcl 5-10 mg po q8 prn for itching, and prednisone ? Dose

## 2017-09-01 NOTE — Telephone Encounter (Signed)
Patient called to make sure Dr. [name] was aware he is taking loratadine 10mg  QD, prednisone 20mg  QD PRN, and hydroxyzine 5-10mg  Q8HRS, PRN as prescribed by Alfonso Ramus, MD at 3/191/19 OV. He had an appointment today with Dr. [name] but as provider was running behind, he was not able to stay and wait.  He has been rescheduled for 09/09/17.  Confirmed medications are on his medication list and assured patient that PCP was able to see current medications before prescribing new. Patient understands to bring medications/list to next week's office visit. Patient agreeable w/plan.

## 2017-09-04 ENCOUNTER — Telehealth: Payer: Self-pay

## 2017-09-04 NOTE — Telephone Encounter (Signed)
Devin Ramsey tranferred pt to me; pt requesting refill tramadol; I spoke with Leavy Cella at [address]. Pt does have available refills and pt can pick up later today. Pt voiced understanding.

## 2017-09-07 NOTE — Progress Notes (Signed)
Cardiology Office Note  Date:  09/09/2017   ID:  Devin, Ramsey [birth year], MRN [id number]  PCP:  Devin Nam, MD   Chief Complaint  Patient presents with  . OTHER    6 month f/u pt would like to confirm if it's ok to continue loratadine and hydroxyzine. Meds reviewed verbally with pt.    HPI:  Devin Ramsey is a 72 year old gentleman with past medical history of Moderate diffuse aortic atherosclerosis on CT 2016 hospital 06/2014 with bronchitis symptoms,  Permanent atrial fibrillation,  chest x-ray showing pneumonia who was kept in the hospital for 5 days,  COPD exacerbation.  cardioversion 08/23/2014 for A. fib flutter,  Previous echocardiogram showed ejection fraction 35-40%, February 2016 in the setting of atrial fibrillation Stopped smoking 06/28/2014, wife still smokes He has previously declined ischemia workup He follows up today to discuss atrial fibrillation management  Rash with triamcinilone cream  Better on prednisone Takes loratidine, itch medication  SOB stable No chest pain in "three years" Takes lasix "twice a year" No edema  No regular exercise program Continues to have knee pain and difficulty walking Denies any significant change in his shortness of breath symptoms  x-ray of left knee shows moderate degenerative change   Tolerating eliquis 5 BID On entresto BID  EKG personally reviewed by myself on todays visit showing atrial flutter, rate 65  bpm,LBBB  Other past medical history EKG in January 2017 showing atrial fibrillation Prior EKG in October and November 2016 shows normal sinus rhythm with left bundle branch block  Total chol 216, LDL 126  previous Ejection fraction remains low at 35-40%, no significant improvement by restoring normal sinus rhythm  Lab work from the hospital showed sodium 132, potassium 5.1, hemoglobin A1c 5.3, negative cardiac enzymes, Total cholesterol   Chest x-ray with interstitial pulmonary edema, small  bilateral pleural effusions  Echocardiogram from 06/29/2014 showing ejection fraction 35-40%, septal motion consistent with bundle branch block, normal right ventricular systolic pressure, normal left atrial size   PMH:   has a past medical history of A-fib (HCC), Acute systolic CHF (congestive heart failure) (HCC), Bilateral pleural effusion, COPD (chronic obstructive pulmonary disease) (HCC), Gout, Pneumonia (2016), Thyroid disease, and Tobacco abuse.  PSH:    Past Surgical History:  Procedure Laterality Date  . HEMORRHOID SURGERY    . SHOULDER SURGERY     right shoulder   . TONSILLECTOMY      Current Outpatient Medications  Medication Sig Dispense Refill  . apixaban (ELIQUIS) 5 MG TABS tablet Take 1 tablet (5 mg total) by mouth 2 (two) times daily. 180 tablet 3  . atorvastatin (LIPITOR) 20 MG tablet Take 1 tablet (20 mg total) by mouth daily. 90 tablet 3  . clobetasol cream (TEMOVATE) 0.05 % Apply 1 application topically 2 (two) times daily as needed. Use the least amount possible. 45 g 2  . cyclobenzaprine (FLEXERIL) 10 MG tablet Take 0.5-1 tablets (5-10 mg total) by mouth 3 (three) times daily as needed for muscle spasms. Sedation caution 30 tablet 1  . furosemide (LASIX) 20 MG tablet Take 1 tablet (20 mg total) by mouth daily as needed. 90 tablet 3  . hydrOXYzine (ATARAX/VISTARIL) 10 MG tablet Take 0.5-1 tablets (5-10 mg total) by mouth every 8 (eight) hours as needed for itching. 30 tablet 3  . levothyroxine (SYNTHROID, LEVOTHROID) 50 MCG tablet Take 1 tablet (50 mcg total) by mouth daily. 90 tablet 3  . loratadine (CLARITIN) 10 MG tablet Take 1 tablet (10  mg total) by mouth daily. 90 tablet 3  . metoprolol succinate (TOPROL XL) 25 MG 24 hr tablet Take 1 tablet (25 mg total) by mouth daily. 90 tablet 3  . potassium chloride SA (KLOR-CON M20) 20 MEQ tablet Take 0.5 tablets (10 mEq total) by mouth daily as needed (with lasix).    . predniSONE (DELTASONE) 20 MG tablet Take 1 tablet  (20 mg total) by mouth daily as needed. For gout. 10 tablet 11  . sacubitril-valsartan (ENTRESTO) 24-26 MG Take 1 tablet by mouth 2 (two) times daily. 180 tablet 3  . tiotropium (SPIRIVA HANDIHALER) 18 MCG inhalation capsule Place 1 capsule (18 mcg total) into inhaler and inhale daily. 30 capsule 12  . traMADol (ULTRAM) 50 MG tablet TAKE 2 TABLETS BY MOUTH EVERY 12 HOURS AS NEEDED FOR  KNEE  PAIN 120 tablet 5  . triamcinolone cream (KENALOG) 0.1 % Apply topically daily as needed. 454 g 2   No current facility-administered medications for this visit.      Allergies:   Codeine and Indocin [indomethacin]   Social History:  The patient  reports that he quit smoking about 3 years ago. His smoking use included cigarettes. He quit after 55.00 years of use. He has never used smokeless tobacco. He reports that he drinks alcohol. He reports that he does not use drugs.   Family History:   He was adopted. Family history is unknown by patient.    Review of Systems: Review of Systems  Constitutional: Negative.   Respiratory: Positive for shortness of breath.   Cardiovascular: Negative.   Gastrointestinal: Negative.   Musculoskeletal: Positive for joint pain.  Neurological: Negative.   Psychiatric/Behavioral: Negative.   All other systems reviewed and are negative.    PHYSICAL EXAM: VS:  BP 120/72 (BP Location: Left Arm, Patient Position: Sitting, Cuff Size: Normal)   Pulse 65   Ht 5\' 10"  (1.778 m)   Wt 183 lb 8 oz (83.2 kg)   BMI 26.33 kg/m  , BMI Body mass index is 26.33 kg/m. GEN: Well nourished, well developed, in no acute distress  HEENT: normal  Neck: no JVD, carotid bruits, or masses Cardiac irregularly irregular , no murmurs, rubs, or gallops,no edema  Respiratory:  Moderately decreased breath sounds throughout otherwise clear, normal work of breathing GI: soft, nontender, nondistended, + BS MS: no deformity or atrophy  Skin: warm and dry, + rash left flank, shoulders  bilaterally Skin excoriation left brachial area Neuro:  Strength and sensation are intact Psych: euthymic mood, full affect    Recent Labs: 02/17/2017: TSH 1.15    Lipid Panel Lab Results  Component Value Date   CHOL 116 07/18/2016   HDL 41 07/18/2016   LDLCALC 34 07/18/2016   TRIG 205 (H) 07/18/2016      Wt Readings from Last 3 Encounters:  09/09/17 183 lb 8 oz (83.2 kg)  08/05/17 181 lb 8 oz (82.3 kg)  06/05/17 177 lb 8 oz (80.5 kg)       ASSESSMENT AND PLAN:  Congestive dilated cardiomyopathy (HCC) - Continue current medications Long history of low blood pressure, no medication changes made Appears euvolemic  rash Resolved on prednisone  Chronic systolic CHF (congestive heart failure) (HCC) -  Denies shortness of breath, appears euvolemic Takes Lasix sparingly Chronically no fluid overload  Essential hypertension -  Blood pressure is well controlled on today's visit. No changes made to the medications. Stable  Permanent atrial fibrillation (HCC) -  Rate controlled on anticoagulation no changes  made, stable   Hyperlipidemia Cholesterol is at goal on the current lipid regimen. No changes to the medications were made.  Repeat lab work through primary care   Total encounter time more than 25 minutes  Greater than 50% was spent in counseling and coordination of care with the patient   Disposition:   F/U  12 months   Orders Placed This Encounter  Procedures  . EKG 12-Lead     Signed, Dossie Arbour, M.D., Ph.D. 09/09/2017  [facility] [place], [place] [phone]

## 2017-09-09 ENCOUNTER — Ambulatory Visit (INDEPENDENT_AMBULATORY_CARE_PROVIDER_SITE_OTHER): Payer: Medicare Other | Admitting: Cardiovascular Disease

## 2017-09-09 ENCOUNTER — Encounter: Payer: Self-pay | Admitting: Cardiovascular Disease

## 2017-09-09 VITALS — BP 120/72 | HR 65 | Ht 70.0 in | Wt 183.5 lb

## 2017-09-09 DIAGNOSIS — I420 Dilated cardiomyopathy: Secondary | ICD-10-CM | POA: Diagnosis not present

## 2017-09-09 DIAGNOSIS — I4819 Other persistent atrial fibrillation: Secondary | ICD-10-CM

## 2017-09-09 DIAGNOSIS — I5022 Chronic systolic (congestive) heart failure: Secondary | ICD-10-CM

## 2017-09-09 DIAGNOSIS — I10 Essential (primary) hypertension: Secondary | ICD-10-CM

## 2017-09-09 DIAGNOSIS — E785 Hyperlipidemia, unspecified: Secondary | ICD-10-CM

## 2017-09-09 DIAGNOSIS — I481 Persistent atrial fibrillation: Secondary | ICD-10-CM

## 2017-09-09 DIAGNOSIS — J449 Chronic obstructive pulmonary disease, unspecified: Secondary | ICD-10-CM

## 2017-09-09 NOTE — Patient Instructions (Signed)

## 2017-10-01 ENCOUNTER — Telehealth: Payer: Self-pay | Admitting: Cardiovascular Disease

## 2017-10-01 NOTE — Telephone Encounter (Signed)
Fax received from CoverMyMeds Key: [id number]  For PA for entresto 24/26 mg tablets. Tried to process through CoverMyMeds, the patient [employer] plan did not recognize him I called and spoke with [employer] [phone] and spoke with a rep there.  They state the patient's coverage with them ended on 07/17/17.  I tried to process through CoverMyMeds with the patients [employer] ID- states it could not find the patient. I called [facility] on [address] and they state they have not tried to fill the patient RX- last filled in April and too early to process.  Will close this encounter as I am unsure at this time how the PA originated.

## 2017-10-10 ENCOUNTER — Other Ambulatory Visit: Payer: Self-pay | Admitting: Family Medicine

## 2017-10-10 NOTE — Telephone Encounter (Signed)
Copied from CRM [id number]. Topic: Quick Communication - Rx Refill/Question >> Oct 10, 2017  3:05 PM Raquel Sarna wrote: predniSONE (DELTASONE) 20 MG tablet  Crestor - heart dr prescribed - pt is asking if Dr. Para March can refill this.  If not let pt know.  Needing refills  [facility] [address] [place] - [address] [place] [address] Phone: [phone] Fax: [phone]

## 2017-10-10 NOTE — Telephone Encounter (Signed)
Pt requesting refill of prednisone, last filled on 10/03/16 #10 with 11 refills. Pt also requesting refill of Crestor which was previously filled by the pt's heart doctor and now the pt is requesting for the medication to be filled by Dr. Para March  LOV: 06/05/17 Dr. Orson Gear on [address]

## 2017-10-14 ENCOUNTER — Other Ambulatory Visit: Payer: Self-pay | Admitting: *Deleted

## 2017-10-14 ENCOUNTER — Telehealth: Payer: Self-pay | Admitting: Cardiovascular Disease

## 2017-10-14 MED ORDER — ROSUVASTATIN CALCIUM 10 MG PO TABS: 10.0000 mg | ORAL_TABLET | Freq: Every day | ORAL | 3 refills | Status: DC

## 2017-10-14 NOTE — Telephone Encounter (Signed)
Refill sent in for this medication.

## 2017-10-14 NOTE — Telephone Encounter (Signed)
°*  STAT* If patient is at the pharmacy, call can be transferred to refill team.   1. Which medications need to be refilled? (please list name of each medication and dose if known) rosuvastatin (CRESTOR) 10 MG   2. Which pharmacy/location (including street and city if local pharmacy) is medication to be sent to? [facility] - [address]  3. Do they need a 30 day or 90 day supply?  90 day

## 2017-10-14 NOTE — Telephone Encounter (Signed)
Please advise if patient should be taking Crestor.  Not on patients current medication list.

## 2017-10-15 MED ORDER — PREDNISONE 20 MG PO TABS: 20.0000 mg | ORAL_TABLET | Freq: Every day | ORAL | 11 refills | Status: DC | PRN

## 2017-10-15 MED ORDER — ATORVASTATIN CALCIUM 20 MG PO TABS: 20.0000 mg | ORAL_TABLET | Freq: Every day | ORAL | 3 refills | Status: DC

## 2017-10-15 NOTE — Telephone Encounter (Signed)
Pt checking status of prednisone being refilled.

## 2017-10-15 NOTE — Telephone Encounter (Signed)
I sent the prednisone rx.   I mistakenly sent atorvastatin rx.  Please cancel that at pharmacy.  I apologize.  Dr. [name] already sent rx for crestor so that is already addressed. Thanks.

## 2017-10-15 NOTE — Telephone Encounter (Signed)
Judeth Cornfield at pharmacy notified to cancel script as instructed. Patient notified by telephone that script for Prednisone has been sent to the pharmacy per Dr. Para March.

## 2017-10-22 ENCOUNTER — Other Ambulatory Visit: Payer: Self-pay | Admitting: Family Medicine

## 2017-10-22 ENCOUNTER — Telehealth: Payer: Self-pay | Admitting: Cardiovascular Disease

## 2017-10-22 NOTE — Telephone Encounter (Signed)
Fax received from [employer] regarding the patient's entresto 24/26 mg tablets.  Patient ID #: [id number]  "This medication or product is on your plan's list of covered drugs. Prior authorization is not required at this time. If your pharmacy has questions regarding the processing of your prescription, please have them call the [facility] pharmacy help desk at [phone]."

## 2017-10-22 NOTE — Telephone Encounter (Signed)
Electronic refill request Last refill 04/16/17- 454 G/2 refills Last office visit 08/05/17/acute

## 2017-10-23 NOTE — Telephone Encounter (Signed)
Can you get him scheduled, please.

## 2017-10-23 NOTE — Telephone Encounter (Signed)
Patient has an appointment scheduled to see Misty Stanley on 11/05/2017 and then with Dr Harrie Jeans, RMA

## 2017-10-23 NOTE — Telephone Encounter (Signed)
Sent.  Due for medicare CPE this summer when possible.  Thanks.

## 2017-10-30 ENCOUNTER — Telehealth: Payer: Self-pay | Admitting: *Deleted

## 2017-10-30 NOTE — Telephone Encounter (Signed)
Fax received from [place], [employer], [employer] stating that the Rx for Prednisone 20 mg is on back-order and [place] requesting that the Rx be sent for Prednisone 10 mg.  Please advise.

## 2017-10-31 MED ORDER — PREDNISONE 10 MG PO TABS: 20.0000 mg | ORAL_TABLET | Freq: Every day | ORAL | 1 refills | Status: DC | PRN

## 2017-10-31 NOTE — Telephone Encounter (Signed)
Sent. Thanks.   

## 2017-10-31 NOTE — Addendum Note (Signed)
Addended by: [name] on: 10/31/2017 07:31 AM   Modules accepted: Orders

## 2017-11-05 ENCOUNTER — Ambulatory Visit (INDEPENDENT_AMBULATORY_CARE_PROVIDER_SITE_OTHER): Payer: Medicare Other

## 2017-11-05 ENCOUNTER — Other Ambulatory Visit: Payer: Self-pay | Admitting: Family Medicine

## 2017-11-05 VITALS — BP 100/60 | HR 64 | Temp 97.4°F | Ht 68.0 in | Wt 182.5 lb

## 2017-11-05 DIAGNOSIS — Z0000 Encounter for general adult medical examination without abnormal findings: Secondary | ICD-10-CM | POA: Diagnosis not present

## 2017-11-05 DIAGNOSIS — I4819 Other persistent atrial fibrillation: Secondary | ICD-10-CM

## 2017-11-05 DIAGNOSIS — Z1159 Encounter for screening for other viral diseases: Secondary | ICD-10-CM | POA: Diagnosis not present

## 2017-11-05 DIAGNOSIS — E039 Hypothyroidism, unspecified: Secondary | ICD-10-CM

## 2017-11-05 DIAGNOSIS — I10 Essential (primary) hypertension: Secondary | ICD-10-CM | POA: Diagnosis not present

## 2017-11-05 LAB — COMPREHENSIVE METABOLIC PANEL
ALT: 14 U/L (ref 0–53)
AST: 16 U/L (ref 0–37)
Albumin: 3.5 g/dL (ref 3.5–5.2)
Alkaline Phosphatase: 44 U/L (ref 39–117)
BUN: 9 mg/dL (ref 6–23)
CALCIUM: 8.6 mg/dL (ref 8.4–10.5)
CHLORIDE: 101 meq/L (ref 96–112)
CO2: 26 meq/L (ref 19–32)
CREATININE: 1.09 mg/dL (ref 0.40–1.50)
GFR: 70.49 mL/min (ref >60.00)
Glucose, Bld: 109 mg/dL — ABNORMAL HIGH (ref 70–99)
Potassium: 4 mEq/L (ref 3.5–5.1)
SODIUM: 134 meq/L — AB (ref 135–145)
Total Bilirubin: 0.8 mg/dL (ref 0.2–1.2)
Total Protein: 5.7 g/dL — ABNORMAL LOW (ref 6.0–8.3)

## 2017-11-05 LAB — LIPID PANEL
Cholesterol: 148 mg/dL (ref 0–200)
HDL: 77.4 mg/dL (ref >39.00)
LDL CALC: 59 mg/dL (ref 0–99)
NonHDL: 70.25
TRIGLYCERIDES: 55 mg/dL (ref 0.0–149.0)
Total CHOL/HDL Ratio: 2
VLDL: 11 mg/dL (ref 0.0–40.0)

## 2017-11-05 LAB — CBC WITH DIFFERENTIAL/PLATELET
BASOS PCT: 0.6 % (ref 0.0–3.0)
Basophils Absolute: 0.1 10*3/uL (ref 0.0–0.1)
Eosinophils Absolute: 0.1 10*3/uL (ref 0.0–0.7)
Eosinophils Relative: 0.9 % (ref 0.0–5.0)
HEMATOCRIT: 29.4 % — AB (ref 39.0–52.0)
Hemoglobin: 10.1 g/dL — ABNORMAL LOW (ref 13.0–17.0)
LYMPHS ABS: 2.6 10*3/uL (ref 0.7–4.0)
Lymphocytes Relative: 27.6 % (ref 12.0–46.0)
MCHC: 34.6 g/dL (ref 30.0–36.0)
MCV: 97.5 fl (ref 78.0–100.0)
MONO ABS: 0.8 10*3/uL (ref 0.1–1.0)
Monocytes Relative: 8.3 % (ref 3.0–12.0)
NEUTROS ABS: 5.8 10*3/uL (ref 1.4–7.7)
Neutrophils Relative %: 62.6 % (ref 43.0–77.0)
PLATELETS: 140 10*3/uL — AB (ref 150.0–400.0)
RBC: 3.01 Mil/uL — ABNORMAL LOW (ref 4.22–5.81)
RDW: 14.8 % (ref 11.5–15.5)
WBC: 9.3 10*3/uL (ref 4.0–10.5)

## 2017-11-05 LAB — TSH: TSH: 3.22 u[IU]/mL (ref 0.35–4.50)

## 2017-11-05 NOTE — Progress Notes (Signed)
Subjective:   Devin Ramsey is a 72 y.o. male who presents for Medicare Annual (Subsequent) preventive examination.  Review of Systems:  N/A Cardiac Risk Factors include: advanced age (>54men, >59 women);male gender;hypertension;dyslipidemia     Objective:     Vitals: BP 100/60 (BP Location: Right Arm, Patient Position: Sitting, Cuff Size: Normal)   Pulse 64   Temp (!) 97.4 F (36.3 C) (Oral)   Ht 5\' 8"  (1.727 m) Comment: shoes  Wt 182 lb 8 oz (82.8 kg)   SpO2 99%   BMI 27.75 kg/m   Body mass index is 27.75 kg/m.  Advanced Directives 11/05/2017  Does Patient Have a Medical Advance Directive? No  Would patient like information on creating a medical advance directive? No - Patient declined    Tobacco Social History   Tobacco Use  Smoking Status Former Smoker  . Years: 55.00  . Types: Cigarettes  . Last attempt to quit: 06/27/2014  . Years since quitting: 3.3  Smokeless Tobacco Never Used     Counseling given: No   Clinical Intake:  Pre-visit preparation completed: Yes  Pain : No/denies pain Pain Score: 0-No pain     Nutritional Status: BMI > 30  Obese Nutritional Risks: None Diabetes: No  How often do you need to have someone help you when you read instructions, pamphlets, or other written materials from your doctor or pharmacy?: 1 - Never What is the last grade level you completed in school?: 12th grade  Interpreter Needed?: No  Comments: pt lives with spouse Information entered by :: LPinson, LPN  Past Medical History:  Diagnosis Date  . A-fib (HCC)   . Acute systolic CHF (congestive heart failure) (HCC)    EF 35% to 40%  . Bilateral pleural effusion   . COPD (chronic obstructive pulmonary disease) (HCC)   . Gout   . Pneumonia 2016  . Thyroid disease   . Tobacco abuse    Past Surgical History:  Procedure Laterality Date  . HEMORRHOID SURGERY    . SHOULDER SURGERY     right shoulder   . TONSILLECTOMY     Family History  Adopted: Yes   Family history unknown: Yes   Social History   Socioeconomic History  . Marital status: Married    Spouse name: Not on file  . Number of children: Not on file  . Years of education: Not on file  . Highest education level: Not on file  Occupational History  . Not on file  Social Needs  . Financial resource strain: Not on file  . Food insecurity:    Worry: Not on file    Inability: Not on file  . Transportation needs:    Medical: Not on file    Non-medical: Not on file  Tobacco Use  . Smoking status: Former Smoker    Years: 55.00    Types: Cigarettes    Last attempt to quit: 06/27/2014    Years since quitting: 3.3  . Smokeless tobacco: Never Used  Substance and Sexual Activity  . Alcohol use: Yes    Alcohol/week: 9.0 oz    Types: 15 Cans of beer per week    Comment: 2-3 beers a day average  . Drug use: No  . Sexual activity: Not Currently  Lifestyle  . Physical activity:    Days per week: Not on file    Minutes per session: Not on file  . Stress: Not on file  Relationships  . Social connections:  Talks on phone: Not on file    Gets together: Not on file    Attends religious service: Not on file    Active member of club or organization: Not on file    Attends meetings of clubs or organizations: Not on file    Relationship status: Not on file  Other Topics Concern  . Not on file  Social History Narrative   Married 1966   From [place] fan   1 son, local    Outpatient Encounter Medications as of 11/05/2017  Medication Sig  . apixaban (ELIQUIS) 5 MG TABS tablet Take 1 tablet (5 mg total) by mouth 2 (two) times daily.  . clobetasol cream (TEMOVATE) 0.05 % Apply 1 application topically 2 (two) times daily as needed. Use the least amount possible.  . cyclobenzaprine (FLEXERIL) 10 MG tablet Take 0.5-1 tablets (5-10 mg total) by mouth 3 (three) times daily as needed for muscle spasms. Sedation caution  . furosemide (LASIX) 20 MG tablet Take 1 tablet (20 mg total)  by mouth daily as needed.  . hydrOXYzine (ATARAX/VISTARIL) 10 MG tablet Take 0.5-1 tablets (5-10 mg total) by mouth every 8 (eight) hours as needed for itching.  . levothyroxine (SYNTHROID, LEVOTHROID) 50 MCG tablet Take 1 tablet (50 mcg total) by mouth daily.  Marland Kitchen loratadine (CLARITIN) 10 MG tablet Take 1 tablet (10 mg total) by mouth daily.  . metoprolol succinate (TOPROL XL) 25 MG 24 hr tablet Take 1 tablet (25 mg total) by mouth daily.  . potassium chloride SA (KLOR-CON M20) 20 MEQ tablet Take 0.5 tablets (10 mEq total) by mouth daily as needed (with lasix).  . predniSONE (DELTASONE) 10 MG tablet Take 2 tablets (20 mg total) by mouth daily as needed. For gout.  . rosuvastatin (CRESTOR) 10 MG tablet Take 1 tablet (10 mg total) by mouth daily.  . sacubitril-valsartan (ENTRESTO) 24-26 MG Take 1 tablet by mouth 2 (two) times daily.  Marland Kitchen tiotropium (SPIRIVA HANDIHALER) 18 MCG inhalation capsule Place 1 capsule (18 mcg total) into inhaler and inhale daily.  . traMADol (ULTRAM) 50 MG tablet TAKE 2 TABLETS BY MOUTH EVERY 12 HOURS AS NEEDED FOR  KNEE  PAIN  . triamcinolone cream (KENALOG) 0.1 % APPLY TOPICALLY DAILY AS NEEDED   No facility-administered encounter medications on file as of 11/05/2017.     Activities of Daily Living In your present state of health, do you have any difficulty performing the following activities: 11/05/2017  Hearing? Y  Vision? N  Difficulty concentrating or making decisions? N  Walking or climbing stairs? Y  Dressing or bathing? N  Doing errands, shopping? N  Preparing Food and eating ? N  Using the Toilet? N  In the past six months, have you accidently leaked urine? N  Do you have problems with loss of bowel control? N  Managing your Medications? N  Managing your Finances? N  Housekeeping or managing your Housekeeping? N  Some recent data might be hidden    Patient Care Team: [name], MD as PCP - General (Family Medicine) Antonieta Iba, MD as  Consulting Physician (Cardiology)    Assessment:   This is a routine wellness examination for [place].   Hearing Screening   125Hz  250Hz  500Hz  1000Hz  2000Hz  3000Hz  4000Hz  6000Hz  8000Hz   Right ear:   40 0 40  0    Left ear:   40 40 0  0    Vision Screening Comments: Vision exam in Winter 2018 @  [facility]    Exercise Activities and Dietary recommendations Current Exercise Habits: The patient does not participate in regular exercise at present, Exercise limited by: orthopedic condition(s)  Goals    . Patient Stated     Starting 11/05/2017, I will continue taking medications as prescribed.        Fall Risk Fall Risk  11/05/2017  Falls in the past year? No  Risk for fall due to : Impaired balance/gait;Impaired mobility   Depression Screen PHQ 2/9 Scores 11/05/2017  PHQ - 2 Score 0  PHQ- 9 Score 0     Cognitive Function MMSE - Mini Mental State Exam 11/05/2017  Orientation to time 5  Orientation to Place 5  Registration 3  Attention/ Calculation 0  Recall 3  Language- name 2 objects 0  Language- repeat 1  Language- follow 3 step command 3  Language- read & follow direction 0  Write a sentence 0  Copy design 0  Total score 20     PLEASE NOTE: A Mini-Cog screen was completed. Maximum score is 20. A value of 0 denotes this part of [place]stein MMSE was not completed or the patient failed this part of the Mini-Cog screening.   Mini-Cog Screening Orientation to Time - Max 5 pts Orientation to Place - Max 5 pts Registration - Max 3 pts Recall - Max 3 pts Language Repeat - Max 1 pts Language Follow 3 Step Command - Max 3 pts     Immunization History  Administered Date(s) Administered  . Influenza,inj,Quad PF,6+ Mos 03/07/2016, 02/17/2017  . Influenza-Unspecified 02/14/2015  . Pneumococcal Conjugate-13 07/11/2015  . Pneumococcal Polysaccharide-23 06/20/2014    Screening Tests Health Maintenance  Topic Date Due  . TETANUS/TDAP  11/06/2018 (Originally 11/17/1964)  .  COLONOSCOPY  11/17/2018 (Originally 11/17/1994)  . INFLUENZA VACCINE  12/18/2017  . Hepatitis C Screening  Completed  . PNA vac Low Risk Adult  Completed      Plan:     I have personally reviewed, addressed, and noted the following in the patient's chart:  A. Medical and social history B. Use of alcohol, tobacco or illicit drugs  C. Current medications and supplements D. Functional ability and status E.  Nutritional status F.  Physical activity G. Advance directives H. List of other physicians I.  Hospitalizations, surgeries, and ER visits in previous 12 months J.  Vitals K. Screenings to include hearing, vision, cognitive, depression L. Referrals and appointments - none  In addition, I have reviewed and discussed with patient certain preventive protocols, quality metrics, and best practice recommendations. A written personalized care plan for preventive services as well as general preventive health recommendations were provided to patient.  See attached scanned questionnaire for additional information.   Signed,   Randa Evens, MHA, BS, LPN Health Coach

## 2017-11-05 NOTE — Patient Instructions (Signed)
Devin Ramsey , Thank you for taking time to come for your Medicare Wellness Visit. I appreciate your ongoing commitment to your health goals. Please review the following plan we discussed and let me know if I can assist you in the future.   These are the goals we discussed: Goals    . Patient Stated     Starting 11/05/2017, I will continue taking medications as prescribed.        This is a list of the screening recommended for you and due dates:  Health Maintenance  Topic Date Due  . Tetanus Vaccine  11/06/2018*  . Colon Cancer Screening  11/17/2018*  . Flu Shot  12/18/2017  .  Hepatitis C: One time screening is recommended by Center for Disease Control  (CDC) for  adults born from [birth year] through 1966.   Completed  . Pneumonia vaccines  Completed  *Topic was postponed. The date shown is not the original due date.   Preventive Care for Adults  A healthy lifestyle and preventive care can promote health and wellness. Preventive health guidelines for adults include the following key practices.  . A routine yearly physical is a good way to check with your health care provider about your health and preventive screening. It is a chance to share any concerns and updates on your health and to receive a thorough exam.  . Visit your dentist for a routine exam and preventive care every 6 months. Brush your teeth twice a day and floss once a day. Good oral hygiene prevents tooth decay and gum disease.  . The frequency of eye exams is based on your age, health, family medical history, use  of contact lenses, and other factors. Follow your health care provider's recommendations for frequency of eye exams.  . Eat a healthy diet. Foods like vegetables, fruits, whole grains, low-fat dairy products, and lean protein foods contain the nutrients you need without too many calories. Decrease your intake of foods high in solid fats, added sugars, and salt. Eat the right amount of calories for you. Get  information about a proper diet from your health care provider, if necessary.  . Regular physical exercise is one of the most important things you can do for your health. Most adults should get at least 150 minutes of moderate-intensity exercise (any activity that increases your heart rate and causes you to sweat) each week. In addition, most adults need muscle-strengthening exercises on 2 or more days a week.  Silver Sneakers may be a benefit available to you. To determine eligibility, you may visit the website: www.silversneakers.com or contact program at 431-724-4560 Mon-Fri between 8AM-8PM.   . Maintain a healthy weight. The body mass index (BMI) is a screening tool to identify possible weight problems. It provides an estimate of body fat based on height and weight. Your health care provider can find your BMI and can help you achieve or maintain a healthy weight.   For adults 20 years and older: ? A BMI below 18.5 is considered underweight. ? A BMI of 18.5 to 24.9 is normal. ? A BMI of 25 to 29.9 is considered overweight. ? A BMI of 30 and above is considered obese.   . Maintain normal blood lipids and cholesterol levels by exercising and minimizing your intake of saturated fat. Eat a balanced diet with plenty of fruit and vegetables. Blood tests for lipids and cholesterol should begin at age 44 and be repeated every 5 years. If your lipid or cholesterol levels are  high, you are over 50, or you are at high risk for heart disease, you may need your cholesterol levels checked more frequently. Ongoing high lipid and cholesterol levels should be treated with medicines if diet and exercise are not working.  . If you smoke, find out from your health care provider how to quit. If you do not use tobacco, please do not start.  . If you choose to drink alcohol, please do not consume more than 2 drinks per day. One drink is considered to be 12 ounces (355 mL) of beer, 5 ounces (148 mL) of wine, or 1.5  ounces (44 mL) of liquor.  . If you are 21-33 years old, ask your health care provider if you should take aspirin to prevent strokes.  . Use sunscreen. Apply sunscreen liberally and repeatedly throughout the day. You should seek shade when your shadow is shorter than you. Protect yourself by wearing long sleeves, pants, a wide-brimmed hat, and sunglasses year round, whenever you are outdoors.  . Once a month, do a whole body skin exam, using a mirror to look at the skin on your back. Tell your health care provider of new moles, moles that have irregular borders, moles that are larger than a pencil eraser, or moles that have changed in shape or color.

## 2017-11-05 NOTE — Progress Notes (Signed)
PCP notes:   Health maintenance:  Tetanus vaccine - postponed/insurance Colonoscopy - PCP please address at next appt Hep C screening - completed  Abnormal screenings:   Hearing - failed  Hearing Screening   125Hz  250Hz  500Hz  1000Hz  2000Hz  3000Hz  4000Hz  6000Hz  8000Hz   Right ear:   40 0 40  0    Left ear:   40 40 0  0    Vision Screening Comments: Vision exam in Winter 2018 @ [facility]   Patient concerns:   None  Nurse concerns:  Patient has very unsteady gait evidenced by staggering and holding to wall for support. Did not bring walker to AWV appt. Staff stayed with patient continually to reduce risk of falls. Patient strongly encouraged to keep walker with him at all times.   Next PCP appt:   11/14/17 @ 1215

## 2017-11-06 LAB — HEPATITIS C ANTIBODY
HEP C AB: NONREACTIVE
SIGNAL TO CUT-OFF: 0.01 (ref <1.00)

## 2017-11-09 NOTE — Progress Notes (Signed)
I reviewed health advisor's note, was available for consultation, and agree with documentation and plan.  

## 2017-11-14 ENCOUNTER — Ambulatory Visit (INDEPENDENT_AMBULATORY_CARE_PROVIDER_SITE_OTHER): Payer: Medicare Other | Admitting: Family Medicine

## 2017-11-14 ENCOUNTER — Encounter: Payer: Self-pay | Admitting: Family Medicine

## 2017-11-14 VITALS — BP 100/60 | HR 64 | Temp 97.4°F | Ht 68.0 in | Wt 182.5 lb

## 2017-11-14 DIAGNOSIS — E782 Mixed hyperlipidemia: Secondary | ICD-10-CM

## 2017-11-14 DIAGNOSIS — E039 Hypothyroidism, unspecified: Secondary | ICD-10-CM | POA: Diagnosis not present

## 2017-11-14 DIAGNOSIS — Z0000 Encounter for general adult medical examination without abnormal findings: Secondary | ICD-10-CM

## 2017-11-14 DIAGNOSIS — J449 Chronic obstructive pulmonary disease, unspecified: Secondary | ICD-10-CM

## 2017-11-14 DIAGNOSIS — D649 Anemia, unspecified: Secondary | ICD-10-CM

## 2017-11-14 DIAGNOSIS — I5022 Chronic systolic (congestive) heart failure: Secondary | ICD-10-CM

## 2017-11-14 DIAGNOSIS — L853 Xerosis cutis: Secondary | ICD-10-CM | POA: Diagnosis not present

## 2017-11-14 DIAGNOSIS — Z7189 Other specified counseling: Secondary | ICD-10-CM

## 2017-11-14 LAB — CBC WITH DIFFERENTIAL/PLATELET
BASOS PCT: 0.6 % (ref 0.0–3.0)
Basophils Absolute: 0.1 10*3/uL (ref 0.0–0.1)
EOS PCT: 0.9 % (ref 0.0–5.0)
Eosinophils Absolute: 0.1 10*3/uL (ref 0.0–0.7)
HEMATOCRIT: 31.2 % — AB (ref 39.0–52.0)
HEMOGLOBIN: 10.9 g/dL — AB (ref 13.0–17.0)
LYMPHS PCT: 34 % (ref 12.0–46.0)
Lymphs Abs: 3 10*3/uL (ref 0.7–4.0)
MCHC: 35 g/dL (ref 30.0–36.0)
MCV: 98 fl (ref 78.0–100.0)
MONOS PCT: 7.8 % (ref 3.0–12.0)
Monocytes Absolute: 0.7 10*3/uL (ref 0.1–1.0)
Neutro Abs: 4.9 10*3/uL (ref 1.4–7.7)
Neutrophils Relative %: 56.7 % (ref 43.0–77.0)
Platelets: 139 10*3/uL — ABNORMAL LOW (ref 150.0–400.0)
RBC: 3.19 Mil/uL — AB (ref 4.22–5.81)
RDW: 14.9 % (ref 11.5–15.5)
WBC: 8.7 10*3/uL (ref 4.0–10.5)

## 2017-11-14 LAB — IBC PANEL
IRON: 136 ug/dL (ref 42–165)
Saturation Ratios: 42.8 % (ref 20.0–50.0)
Transferrin: 227 mg/dL (ref 212.0–360.0)

## 2017-11-14 LAB — FOLATE: FOLATE: 10.1 ng/mL (ref >5.9)

## 2017-11-14 LAB — VITAMIN B12: VITAMIN B 12: 224 pg/mL (ref 211–911)

## 2017-11-14 NOTE — Patient Instructions (Addendum)
Cut back to 5mg  of prednisone for about 10 days, then stop it.   Go to the lab on the way out.  We'll contact you with your lab report. Take care.  Glad to see you.  Update me as needed.

## 2017-11-14 NOTE — Progress Notes (Signed)
Declined hearing aids.  D/w pt.   Defer colonoscopy given other health consideration, d/w pt.  He agrees.   Wife designated if patient were incapacitated.  D/w pt 11/17/15.  Gait changes d/w pt.  No falls.  He is still having some trouble with leg pain and that likely affects his gait.  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  My legs are wore out."  Not lightheaded on standing.   Gait is still symmetric without foot drop.  Hypothyroidism.  Still on baseline replacement.  No ADE on med.  Labs d/w pt.    Prednisone prev used only for gout but had been taking it daily recently.  D/w pt.  No recent flares.  No sx currently.  He is taking 10mg  a day.    He had used TAC prev for itching.  Prednisone helped with the itching.  See above re: recent use.   Elevated Cholesterol: Using medications without problems:yes Muscle aches: no Diet compliance: d/w pt.   Exercise: as tolerated.    COPD.  Still on spiriva at baseline, not used everyday but with relief  No ADE on med.  Not SOB now.    CHF.  Swelling controlled, only trace BLE.  Weight is stable.   Compliant with medications.  No adverse effect of medications.  Lower hgb noted. No bleeding.  Still on anticoagulation.    PMH and SH reviewed  ROS: Per HPI unless specifically indicated in ROS section   Meds, vitals, and allergies reviewed.   GEN: nad, alert and oriented HEENT: mucous membranes moist NECK: supple w/o LA CV: sounds to be IRR but not tachy PULM: ctab, no inc wob ABD: soft, +bs EXT: trace BLE edema SKIN: no acute rash

## 2017-11-16 DIAGNOSIS — Z0000 Encounter for general adult medical examination without abnormal findings: Secondary | ICD-10-CM | POA: Insufficient documentation

## 2017-11-16 DIAGNOSIS — D649 Anemia, unspecified: Secondary | ICD-10-CM | POA: Insufficient documentation

## 2017-11-16 NOTE — Assessment & Plan Note (Signed)
Wife designated if patient were incapacitated.  

## 2017-11-16 NOTE — Assessment & Plan Note (Signed)
Declined hearing aids.  D/w pt.   Defer colonoscopy given other health consideration, d/w pt.  He agrees.   Wife designated if patient were incapacitated.  D/w pt 11/17/15.

## 2017-11-16 NOTE — Assessment & Plan Note (Signed)
TSH at goal.  No change in meds.  Continue as is.

## 2017-11-16 NOTE — Assessment & Plan Note (Signed)
Unclear significance.  Incidentally noted.  No known bleeding.  Some notes on follow-up labs.  Discussed with patient. >25 minutes spent in face to face time with patient, >50% spent in counselling or coordination of care, discussing anemia, prednisone use, COPD, cholesterol, heart failure, etc. See AVS.

## 2017-11-16 NOTE — Assessment & Plan Note (Signed)
Itching had responded to prednisone but he likely needs to taper off of this as tolerated.  See above.  He agrees.  He can use triamcinolone as needed.

## 2017-11-16 NOTE — Assessment & Plan Note (Signed)
Continue baseline inhaler with Spiriva but will try to have patient taper his prednisone use.  Clear on exam today.

## 2017-11-16 NOTE — Assessment & Plan Note (Signed)
Lipids at goal.  Continue as is.  Labs discussed with patient.  He agrees.  I do not think he has myopathy related to statin.  We talked about his gait changes.  He has history of significant hard physical work over the years.  I think is likely the best option for him to continue activity as tolerated.  We talked about using a cane if needed.  He does not have typical claudication symptoms.

## 2017-11-16 NOTE — Assessment & Plan Note (Signed)
No change in medications at this point other than having him try to taper his prednisone.  Advised not to quit cold [place].  Trace edema.  Lungs are clear.  Okay for outpatient follow-up.

## 2017-11-19 DIAGNOSIS — I10 Essential (primary) hypertension: Secondary | ICD-10-CM | POA: Diagnosis not present

## 2017-11-19 DIAGNOSIS — H25813 Combined forms of age-related cataract, bilateral: Secondary | ICD-10-CM | POA: Diagnosis not present

## 2017-11-19 DIAGNOSIS — H35033 Hypertensive retinopathy, bilateral: Secondary | ICD-10-CM | POA: Diagnosis not present

## 2017-11-19 DIAGNOSIS — H18413 Arcus senilis, bilateral: Secondary | ICD-10-CM | POA: Diagnosis not present

## 2017-11-21 ENCOUNTER — Other Ambulatory Visit: Payer: Self-pay | Admitting: Family Medicine

## 2017-11-21 ENCOUNTER — Encounter: Payer: Self-pay | Admitting: *Deleted

## 2017-11-21 DIAGNOSIS — D649 Anemia, unspecified: Secondary | ICD-10-CM

## 2017-11-24 ENCOUNTER — Encounter: Payer: Self-pay | Admitting: *Deleted

## 2017-11-28 ENCOUNTER — Other Ambulatory Visit (INDEPENDENT_AMBULATORY_CARE_PROVIDER_SITE_OTHER): Payer: Medicare Other

## 2017-11-28 DIAGNOSIS — D649 Anemia, unspecified: Secondary | ICD-10-CM | POA: Diagnosis not present

## 2017-11-28 LAB — CBC WITH DIFFERENTIAL/PLATELET
BASOS PCT: 1.2 % (ref 0.0–3.0)
Basophils Absolute: 0.1 10*3/uL (ref 0.0–0.1)
Eosinophils Absolute: 0.2 10*3/uL (ref 0.0–0.7)
Eosinophils Relative: 2.2 % (ref 0.0–5.0)
HEMATOCRIT: 29.3 % — AB (ref 39.0–52.0)
HEMOGLOBIN: 10.1 g/dL — AB (ref 13.0–17.0)
LYMPHS PCT: 41 % (ref 12.0–46.0)
Lymphs Abs: 3.1 10*3/uL (ref 0.7–4.0)
MCHC: 34.5 g/dL (ref 30.0–36.0)
MCV: 99.3 fl (ref 78.0–100.0)
MONOS PCT: 6.9 % (ref 3.0–12.0)
Monocytes Absolute: 0.5 10*3/uL (ref 0.1–1.0)
Neutro Abs: 3.7 10*3/uL (ref 1.4–7.7)
Neutrophils Relative %: 48.7 % (ref 43.0–77.0)
Platelets: 160 10*3/uL (ref 150.0–400.0)
RBC: 2.96 Mil/uL — AB (ref 4.22–5.81)
RDW: 14.8 % (ref 11.5–15.5)
WBC: 7.5 10*3/uL (ref 4.0–10.5)

## 2017-11-30 ENCOUNTER — Other Ambulatory Visit: Payer: Self-pay | Admitting: Family Medicine

## 2017-11-30 DIAGNOSIS — D649 Anemia, unspecified: Secondary | ICD-10-CM

## 2017-12-09 DIAGNOSIS — H2511 Age-related nuclear cataract, right eye: Secondary | ICD-10-CM | POA: Diagnosis not present

## 2017-12-09 DIAGNOSIS — H02889 Meibomian gland dysfunction of unspecified eye, unspecified eyelid: Secondary | ICD-10-CM | POA: Diagnosis not present

## 2017-12-09 DIAGNOSIS — H2512 Age-related nuclear cataract, left eye: Secondary | ICD-10-CM | POA: Diagnosis not present

## 2017-12-09 DIAGNOSIS — H02831 Dermatochalasis of right upper eyelid: Secondary | ICD-10-CM | POA: Diagnosis not present

## 2017-12-09 DIAGNOSIS — Z01818 Encounter for other preprocedural examination: Secondary | ICD-10-CM | POA: Diagnosis not present

## 2017-12-11 ENCOUNTER — Telehealth: Payer: Self-pay | Admitting: Family Medicine

## 2017-12-11 NOTE — Telephone Encounter (Signed)
Copied from CRM [id number]. Topic: Inquiry >> Dec 11, 2017  1:16 PM Tamela Oddi, NT wrote: Reason for CRM: Patient states that he seen he seen an eye doctor on 12/09/17 and he needs cataract surgery. The patient states he is having some trouble with his Hemoglobin and he wants to ask the provider do he think it is safe to have this surgery or wait until he gets his hemoglobin levels good.  Patient is requesting a call back CB# [phone]

## 2017-12-12 ENCOUNTER — Other Ambulatory Visit (INDEPENDENT_AMBULATORY_CARE_PROVIDER_SITE_OTHER): Payer: Medicare Other

## 2017-12-12 DIAGNOSIS — D649 Anemia, unspecified: Secondary | ICD-10-CM

## 2017-12-12 LAB — FECAL OCCULT BLOOD, IMMUNOCHEMICAL: Fecal Occult Bld: POSITIVE — AB

## 2017-12-12 NOTE — Telephone Encounter (Signed)
Patient advised.

## 2017-12-12 NOTE — Telephone Encounter (Signed)
He should be okay to go through cataract surgery, with regards to his hemoglobin.  I will defer to cardiology about his anticoagulation around the time of surgery.  I routed this to cardiology in the meantime also.  Thanks.

## 2017-12-14 NOTE — Telephone Encounter (Signed)
Would contact eye doctor Typically they allow the patient to stay on anticoagulation, eliquis If required, he could stop 2 days prior to procedure, then restart after the procedure

## 2017-12-15 NOTE — Telephone Encounter (Signed)
Patient verbalized understanding of Dr Gollan's recommendations.  

## 2017-12-17 ENCOUNTER — Other Ambulatory Visit: Payer: Self-pay | Admitting: Family Medicine

## 2017-12-17 DIAGNOSIS — R195 Other fecal abnormalities: Secondary | ICD-10-CM

## 2018-01-01 ENCOUNTER — Other Ambulatory Visit (INDEPENDENT_AMBULATORY_CARE_PROVIDER_SITE_OTHER): Payer: Medicare Other

## 2018-01-01 DIAGNOSIS — D649 Anemia, unspecified: Secondary | ICD-10-CM

## 2018-01-01 LAB — CBC WITH DIFFERENTIAL/PLATELET
BASOS PCT: 0.5 % (ref 0.0–3.0)
Basophils Absolute: 0 10*3/uL (ref 0.0–0.1)
Eosinophils Absolute: 0.1 10*3/uL (ref 0.0–0.7)
Eosinophils Relative: 1.8 % (ref 0.0–5.0)
HEMATOCRIT: 28.8 % — AB (ref 39.0–52.0)
HEMOGLOBIN: 9.9 g/dL — AB (ref 13.0–17.0)
LYMPHS PCT: 34.9 % (ref 12.0–46.0)
Lymphs Abs: 2.5 10*3/uL (ref 0.7–4.0)
MCHC: 34.3 g/dL (ref 30.0–36.0)
MCV: 100.4 fl — AB (ref 78.0–100.0)
MONOS PCT: 6.4 % (ref 3.0–12.0)
Monocytes Absolute: 0.5 10*3/uL (ref 0.1–1.0)
Neutro Abs: 4 10*3/uL (ref 1.4–7.7)
Neutrophils Relative %: 56.4 % (ref 43.0–77.0)
Platelets: 129 10*3/uL — ABNORMAL LOW (ref 150.0–400.0)
RBC: 2.87 Mil/uL — ABNORMAL LOW (ref 4.22–5.81)
RDW: 13.8 % (ref 11.5–15.5)
WBC: 7.1 10*3/uL (ref 4.0–10.5)

## 2018-01-04 ENCOUNTER — Other Ambulatory Visit: Payer: Self-pay | Admitting: Family Medicine

## 2018-01-04 DIAGNOSIS — D649 Anemia, unspecified: Secondary | ICD-10-CM

## 2018-01-12 ENCOUNTER — Telehealth: Payer: Self-pay | Admitting: Cardiovascular Disease

## 2018-01-12 NOTE — Telephone Encounter (Signed)
I spoke with the patient. He called with complaints of left arm itching and redness that started ~ 2 years ago. He saw Dr. Terri Piedra (dermatology) at the time and was given triamcinolone cream. He calls today wondering if this could be related to his medications.  He has had no recent medication changes and he states that the itching started before he started most of his heart medicines.  I advised the patient if this is not new for him, then the itching may be coming from something besides his medications, especially since the area of complaint is localized to the left arm, with the exception of a few "spots" on his back from time to time.   I have advised the patient, that if this is bothering him, he could touch base with Dr. Para March or he may need dermatology follow up. The patient voices understanding and is agreeable.

## 2018-01-12 NOTE — Telephone Encounter (Signed)
Pt c/o medication issue:  1. Name of Medication:  Not sure which one is causing issue - believes it is Eliquis   2. How are you currently taking this medication (dosage and times per day)? 1 tablet 2 times daily  3. Are you having a reaction (difficulty breathing--STAT)? Patient's left arm is turning red and itchy   4. What is your medication issue? Patient is curious as to why his left arm is turning red and getting itchy after taking medications Please call to discuss

## 2018-01-17 IMAGING — DX DG KNEE 3 VIEWS*L*
3 series · 3 of 3 positions shown · non-contrast
Comparison: None

CLINICAL DATA: Chronic knee pain

EXAM:
LEFT KNEE - 3 VIEW

[knee ap]
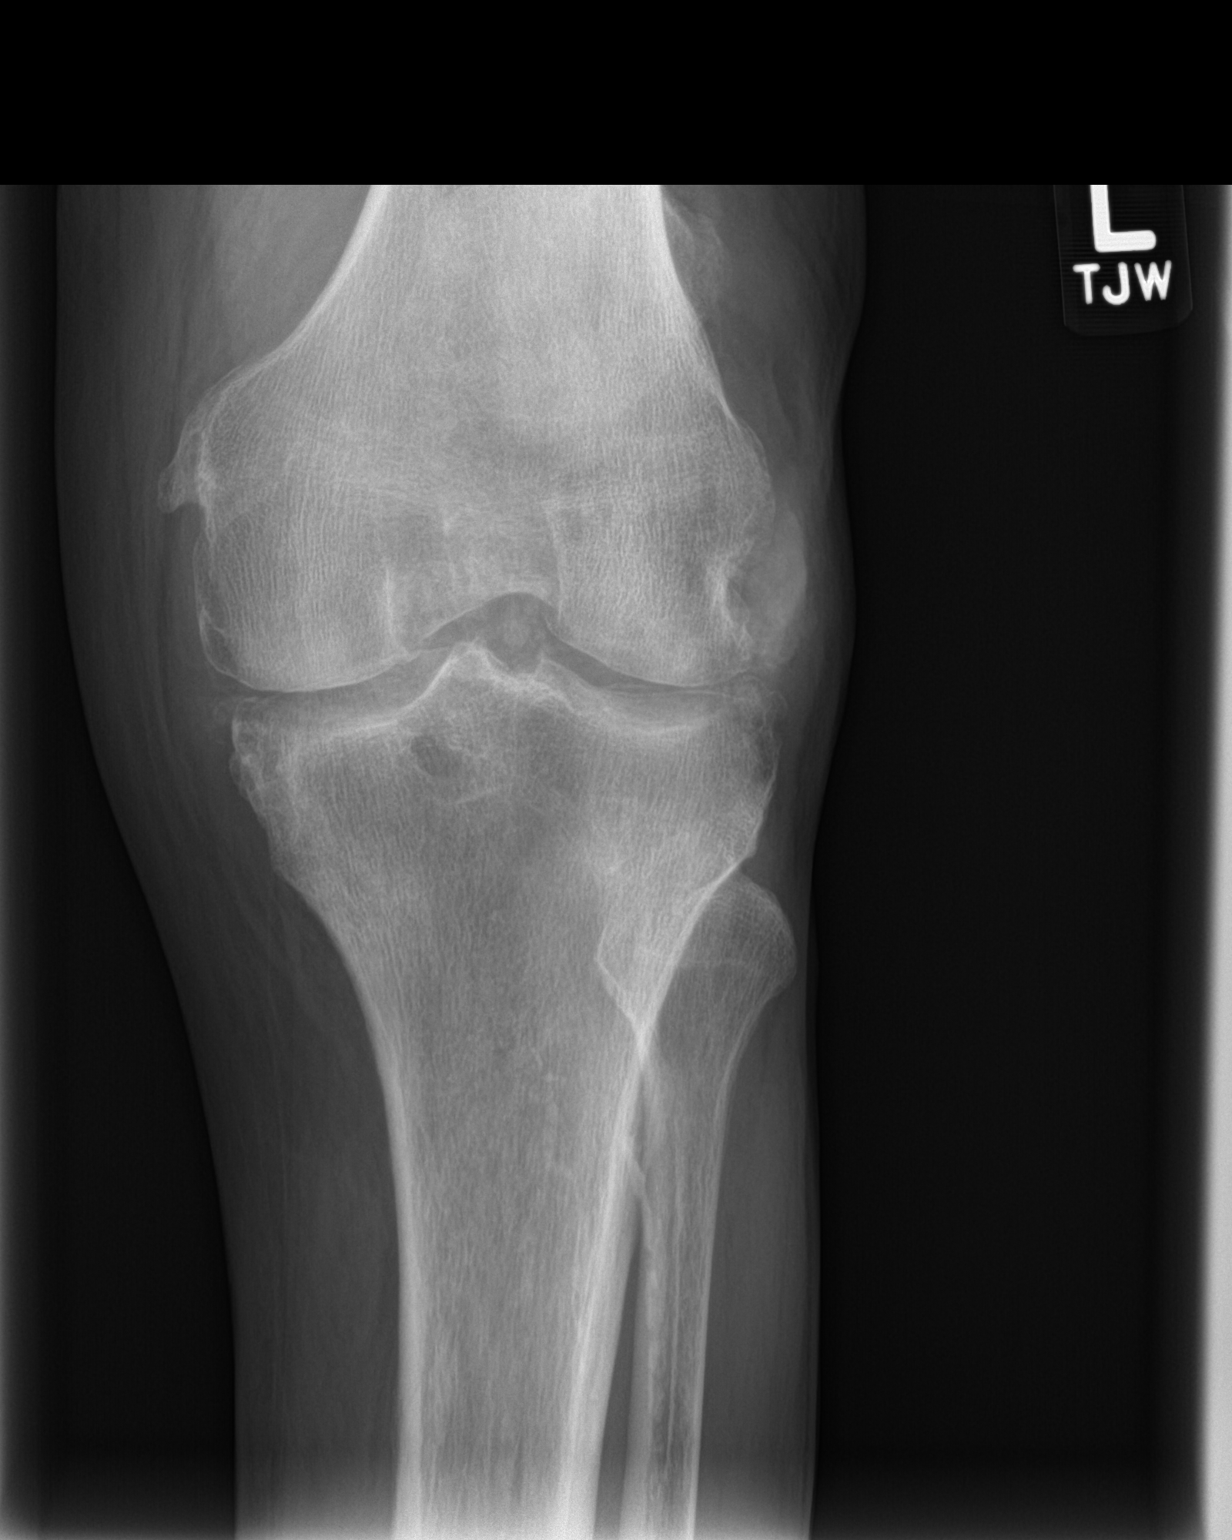

[knee lat]
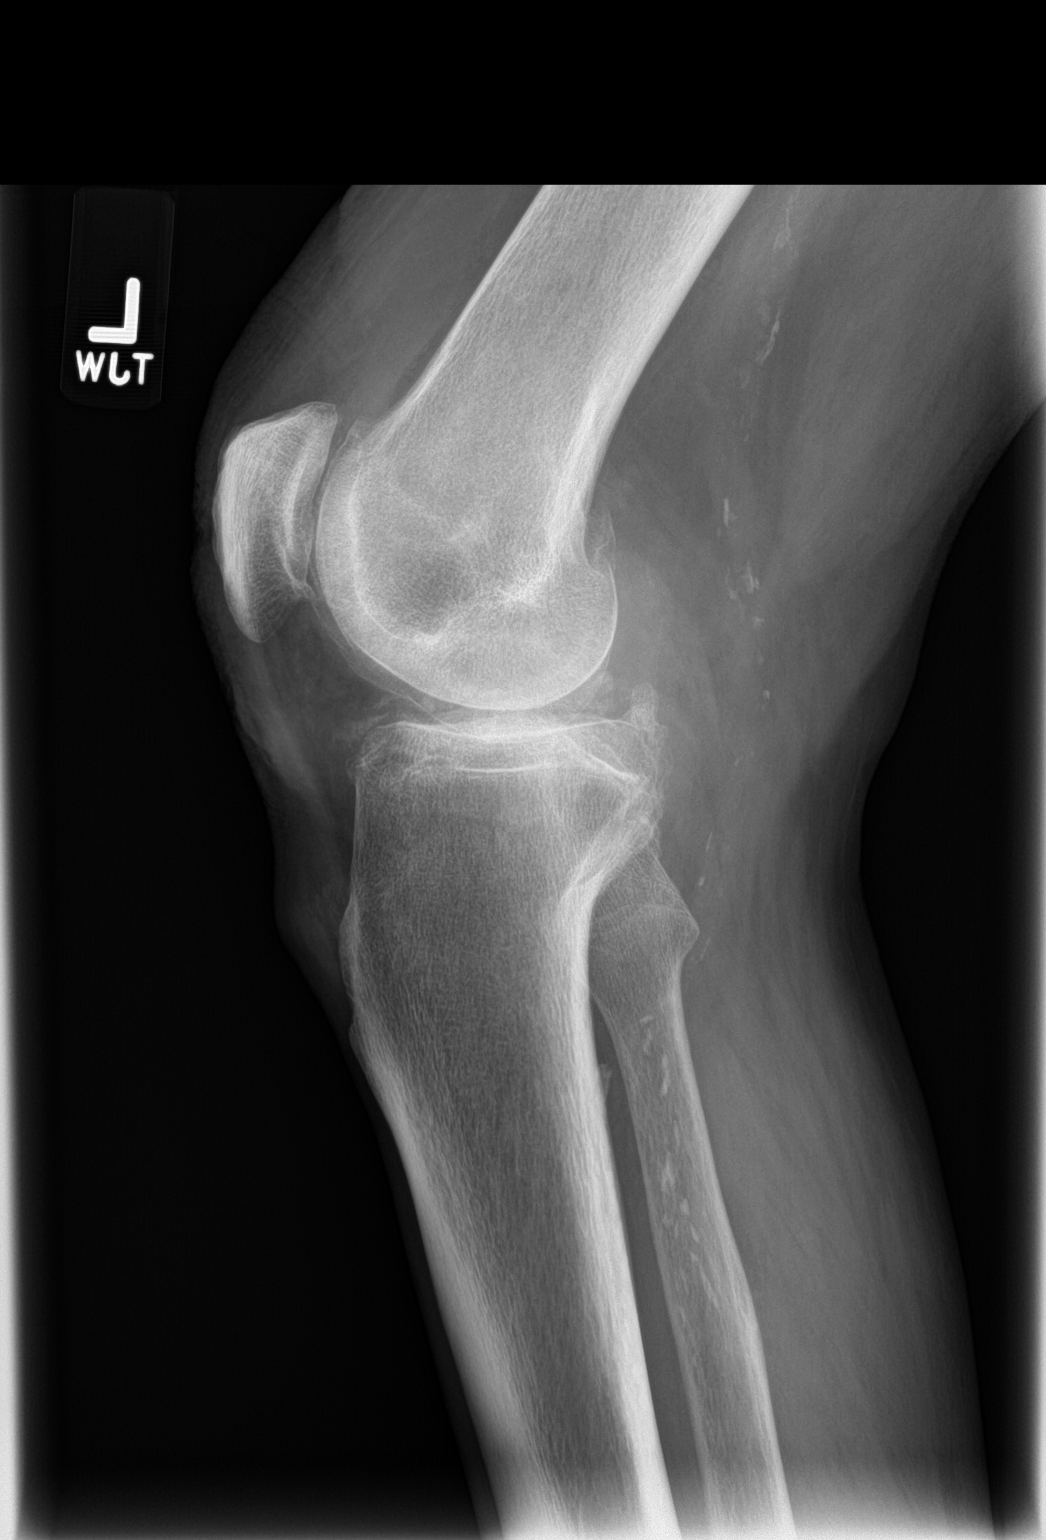

[patella skyline]
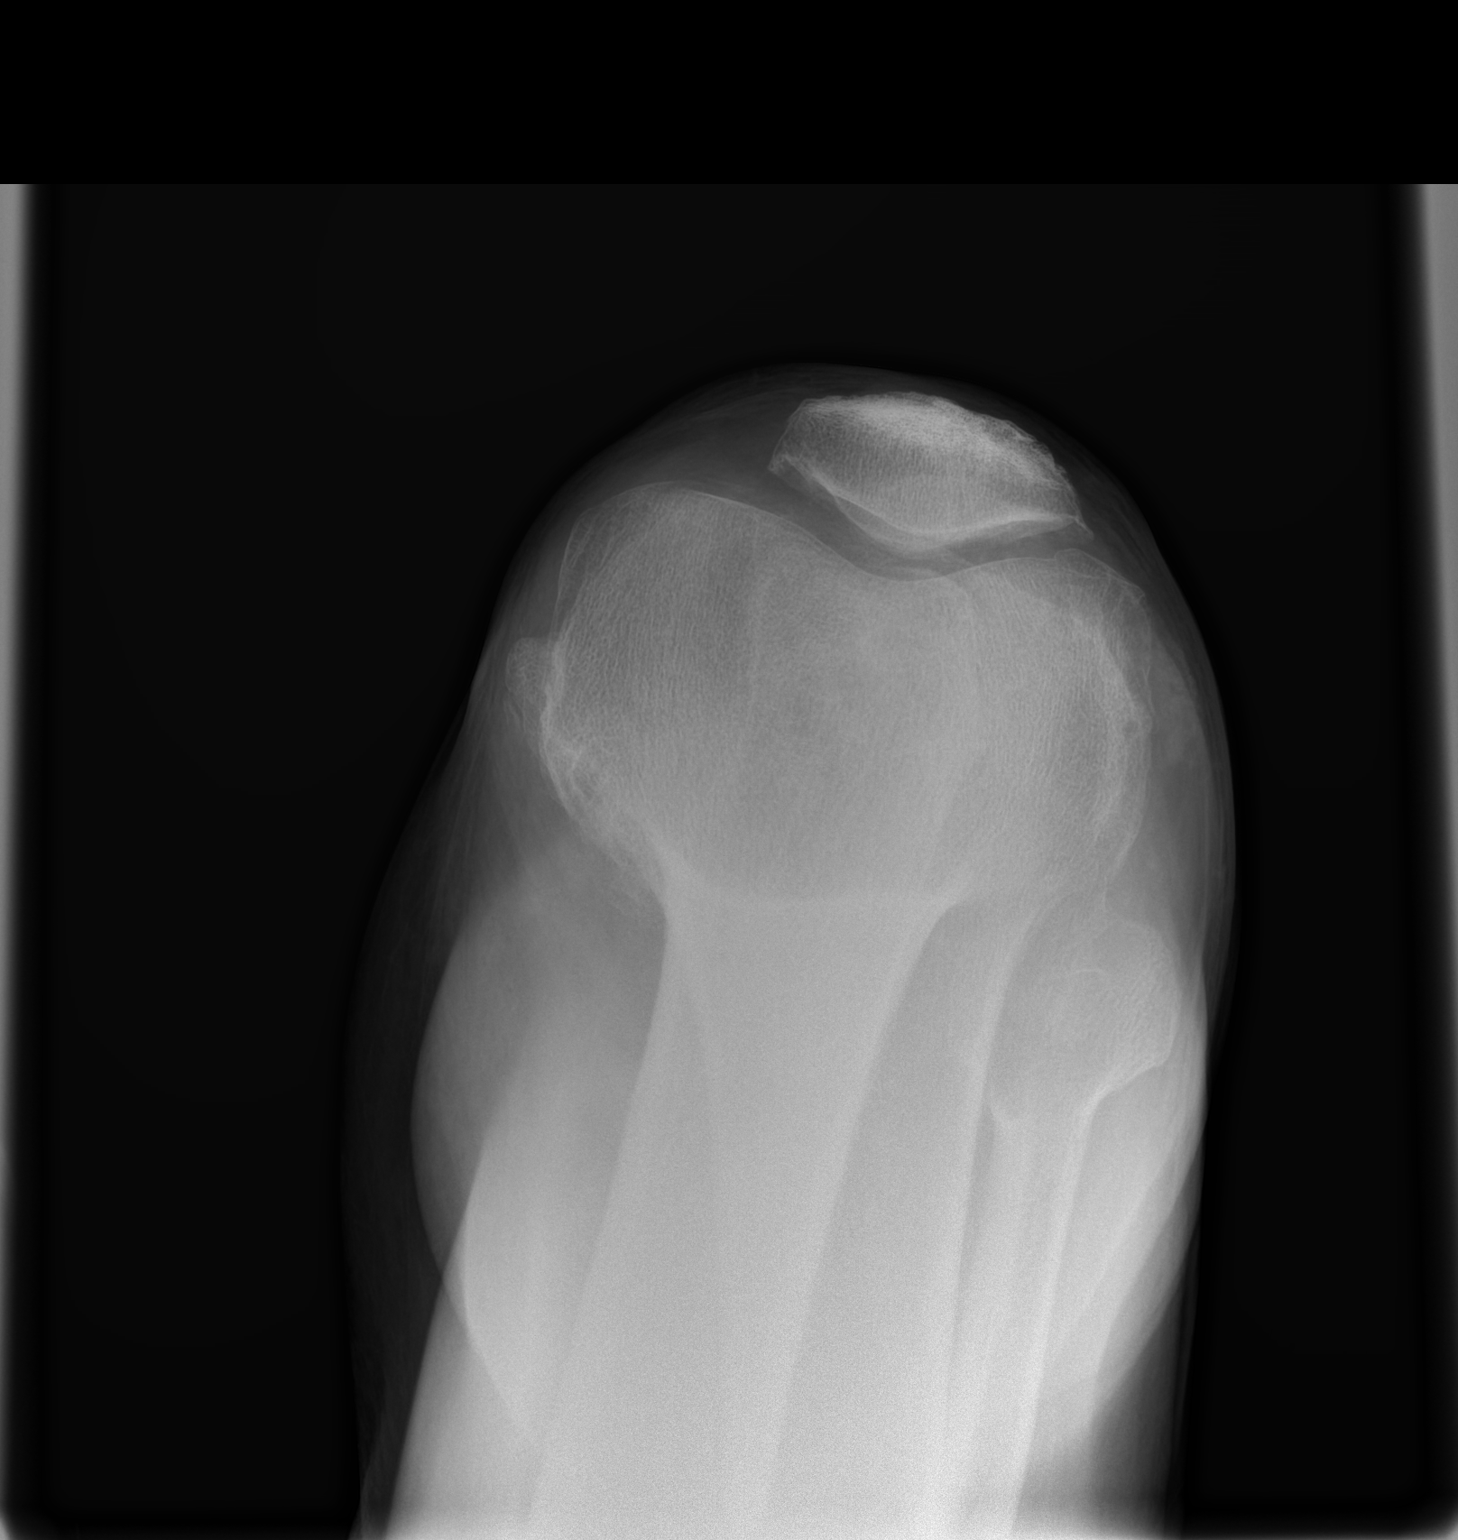

[3 of 3 positions shown; findings below may reference images not displayed]

FINDINGS: Moderately advanced tricompartmental degenerative change in the knee
with joint space narrowing and spurring. Large area of soft tissue
calcification medial to the femoral condyles which may be calcific
tendonitis or chronic ligament injury. Small joint effusion.
Atherosclerotic calcification. Negative for fracture.
IMPRESSION: Moderate degenerative change.

## 2018-01-23 DIAGNOSIS — H2512 Age-related nuclear cataract, left eye: Secondary | ICD-10-CM | POA: Diagnosis not present

## 2018-01-23 DIAGNOSIS — H25812 Combined forms of age-related cataract, left eye: Secondary | ICD-10-CM | POA: Diagnosis not present

## 2018-01-29 ENCOUNTER — Other Ambulatory Visit (INDEPENDENT_AMBULATORY_CARE_PROVIDER_SITE_OTHER): Payer: Medicare Other

## 2018-01-29 DIAGNOSIS — D649 Anemia, unspecified: Secondary | ICD-10-CM

## 2018-01-29 LAB — CBC WITH DIFFERENTIAL/PLATELET
BASOS ABS: 0.1 10*3/uL (ref 0.0–0.1)
Basophils Relative: 0.8 % (ref 0.0–3.0)
Eosinophils Absolute: 0.1 10*3/uL (ref 0.0–0.7)
Eosinophils Relative: 1.1 % (ref 0.0–5.0)
HCT: 30 % — ABNORMAL LOW (ref 39.0–52.0)
Hemoglobin: 10.1 g/dL — ABNORMAL LOW (ref 13.0–17.0)
Lymphocytes Relative: 31.7 % (ref 12.0–46.0)
Lymphs Abs: 2.6 10*3/uL (ref 0.7–4.0)
MCHC: 33.8 g/dL (ref 30.0–36.0)
MCV: 99.7 fl (ref 78.0–100.0)
MONO ABS: 0.6 10*3/uL (ref 0.1–1.0)
Monocytes Relative: 7.7 % (ref 3.0–12.0)
NEUTROS PCT: 58.7 % (ref 43.0–77.0)
Neutro Abs: 4.7 10*3/uL (ref 1.4–7.7)
Platelets: 141 10*3/uL — ABNORMAL LOW (ref 150.0–400.0)
RBC: 3.01 Mil/uL — ABNORMAL LOW (ref 4.22–5.81)
RDW: 13.3 % (ref 11.5–15.5)
WBC: 8.1 10*3/uL (ref 4.0–10.5)

## 2018-02-01 ENCOUNTER — Other Ambulatory Visit: Payer: Self-pay | Admitting: Family Medicine

## 2018-02-02 NOTE — Telephone Encounter (Signed)
Patient says he is out of Prednisone.  Patient says he doesn't take it every day but it is good for "itch" as well as gout.  Please advise.

## 2018-02-02 NOTE — Telephone Encounter (Signed)
Electronic refill request. Prednisone Last office visit:   11/14/17 Last Filled:    20 tablet 1 10/31/2017  Please advise.

## 2018-02-03 ENCOUNTER — Ambulatory Visit: Payer: Medicare Other | Admitting: Gastroenterology

## 2018-02-03 ENCOUNTER — Encounter: Payer: Self-pay | Admitting: Gastroenterology

## 2018-02-03 DIAGNOSIS — R195 Other fecal abnormalities: Secondary | ICD-10-CM

## 2018-02-03 NOTE — Telephone Encounter (Signed)
Patient notified as instructed by telephone and verbalized understanding. 

## 2018-02-03 NOTE — Telephone Encounter (Signed)
Sent. Use the least amount possible.  Take with food.  Thanks.

## 2018-02-12 DIAGNOSIS — H2511 Age-related nuclear cataract, right eye: Secondary | ICD-10-CM | POA: Diagnosis not present

## 2018-02-12 DIAGNOSIS — H25811 Combined forms of age-related cataract, right eye: Secondary | ICD-10-CM | POA: Diagnosis not present

## 2018-03-09 ENCOUNTER — Other Ambulatory Visit: Payer: Self-pay | Admitting: Family Medicine

## 2018-03-09 NOTE — Telephone Encounter (Signed)
Sent.  He was going to f/u with GI.  What happened with that?  Let me know.  Thanks.

## 2018-03-09 NOTE — Telephone Encounter (Signed)
Electronic refill request. Tramadol Last office visit:   11/14/17 Last Filled:    120 tablet 5 08/20/2017  Please advise.

## 2018-03-10 NOTE — Telephone Encounter (Signed)
Apparently the patient did not show up for the appointment and a late fee was charged.  Patient says he can't afford to pay that late fee and when he got up that morning his neck was hurting so bad he couldn't go.

## 2018-03-11 ENCOUNTER — Telehealth: Payer: Self-pay | Admitting: Gastroenterology

## 2018-03-11 NOTE — Telephone Encounter (Signed)
Pt is scheduled for 11/18  Pt aware

## 2018-03-11 NOTE — Telephone Encounter (Signed)
-----   Message from Pasty Spillers, MD sent at 03/11/2018  9:54 AM EDT ----- Tati,   Can you talk to [facility] about the below. Sounds like he needs to be seen, schedule him to see me or any one of the other providers soonest available. And I'll talk to [place] about the below message.   ----- Message ----- From: [name], MD Sent: 03/11/2018   9:33 AM EDT To: Pasty Spillers, MD  Is there any way you can help this patient with rescheduling his appointment?  He is a pleasant guy but he could not come in for his appointment because he was in pain.  I think he was charged a late fee.  His finances are really tight.  I would appreciate any help.  Please let me know.  If he is not going to be able to come to your clinic then I will need to make some plans about following his blood counts.  I really appreciate your help.  Clelia Croft

## 2018-03-11 NOTE — Telephone Encounter (Signed)
Noted. Thanks.  I'll check with GI.

## 2018-03-19 ENCOUNTER — Ambulatory Visit (INDEPENDENT_AMBULATORY_CARE_PROVIDER_SITE_OTHER): Payer: Medicare Other

## 2018-03-19 DIAGNOSIS — Z23 Encounter for immunization: Secondary | ICD-10-CM

## 2018-03-20 DIAGNOSIS — Z961 Presence of intraocular lens: Secondary | ICD-10-CM | POA: Diagnosis not present

## 2018-03-22 ENCOUNTER — Telehealth: Payer: Self-pay | Admitting: Family Medicine

## 2018-03-22 NOTE — Telephone Encounter (Signed)
For charting purposes, patient has follow-up pending at GI.  I will await those notes.  I appreciate the help of all involved.

## 2018-04-06 ENCOUNTER — Ambulatory Visit (INDEPENDENT_AMBULATORY_CARE_PROVIDER_SITE_OTHER): Payer: Medicare Other | Admitting: Gastroenterology

## 2018-04-06 ENCOUNTER — Encounter: Payer: Self-pay | Admitting: Gastroenterology

## 2018-04-06 VITALS — BP 101/69 | HR 73 | Ht 68.0 in | Wt 177.8 lb

## 2018-04-06 DIAGNOSIS — R195 Other fecal abnormalities: Secondary | ICD-10-CM

## 2018-04-06 DIAGNOSIS — D649 Anemia, unspecified: Secondary | ICD-10-CM

## 2018-04-06 NOTE — Progress Notes (Signed)
Devin Ramsey [address]  [address]  [place], [place] [address]  Main: [phone]  Fax: [phone]   Gastroenterology Consultation  Referring Provider:     [name], MD Primary Care Physician:  [name], MD Primary Gastroenterologist:  Dr. Melodie Ramsey Reason for Consultation:     Positive fecal occult blood test        HPI:    Chief Complaint  Patient presents with  . New Patient (Initial Visit)    Reffered by Dr. Saintclair Halsted for Hemepositive stool, c/o itching    Devin Ramsey is a 72 y.o. y/o male referred for consultation & management  by Dr. Para March, Dwana Curd, MD.  Patient denies any previous history of colonoscopy.  Denies any gross hematochezia or melena.  Patient was found to be anemic with hemoglobin of around 9-10 and underwent fecal occult blood testing that was positive. The patient denies abdominal or flank pain, anorexia, nausea or vomiting, dysphagia, change in bowel habits or black or bloody stools or weight loss.  Patient is adopted and so does not know his family history.  MCV is 99-100.  Hemoglobin has been around 9-10 since June 2019, and was 13.7 in March 2016.  Ferritin was elevated in 2016, with low iron at that time.  Patient was seen by [facility] clinic GI and colonoscopy was planned but patient did not get this done at that time.  Past Medical History:  Diagnosis Date  . A-fib (HCC)   . Acute systolic CHF (congestive heart failure) (HCC)    EF 35% to 40%  . Bilateral pleural effusion   . COPD (chronic obstructive pulmonary disease) (HCC)   . Gout   . Pneumonia 2016  . Thyroid disease   . Tobacco abuse     Past Surgical History:  Procedure Laterality Date  . HEMORRHOID SURGERY    . SHOULDER SURGERY     right shoulder   . TONSILLECTOMY      Prior to Admission medications   Medication Sig Start Date End Date Taking? Authorizing Provider  apixaban (ELIQUIS) 5 MG TABS tablet Take 1 tablet (5 mg total) by mouth  2 (two) times daily. 04/21/17  Yes Gollan, Tollie Pizza, MD  clobetasol cream (TEMOVATE) 0.05 % Apply 1 application topically 2 (two) times daily as needed. Use the least amount possible. 02/17/17  Yes [name], MD  cyclobenzaprine (FLEXERIL) 10 MG tablet Take 0.5-1 tablets (5-10 mg total) by mouth 3 (three) times daily as needed for muscle spasms. Sedation caution 02/13/16  Yes [name], MD  furosemide (LASIX) 20 MG tablet Take 1 tablet (20 mg total) by mouth daily as needed. 06/29/15  Yes Antonieta Iba, MD  hydrOXYzine (ATARAX/VISTARIL) 10 MG tablet Take 0.5-1 tablets (5-10 mg total) by mouth every 8 (eight) hours as needed for itching. 08/05/17  Yes Tower, Audrie Gallus, MD  levothyroxine (SYNTHROID, LEVOTHROID) 50 MCG tablet Take 1 tablet (50 mcg total) by mouth daily. 02/18/17  Yes [name], MD  loratadine (CLARITIN) 10 MG tablet Take 1 tablet (10 mg total) by mouth daily. 08/05/17  Yes Tower, Audrie Gallus, MD  metoprolol succinate (TOPROL XL) 25 MG 24 hr tablet Take 1 tablet (25 mg total) by mouth daily. 06/16/17  Yes Gollan, Tollie Pizza, MD  potassium chloride SA (KLOR-CON M20) 20 MEQ tablet Take 0.5 tablets (10 mEq total) by mouth daily as needed (with lasix). 10/03/16  Yes [name], MD  predniSONE (  DELTASONE) 10 MG tablet TAKE 2 TABLETS BY MOUTH ONCE DAILY AS NEEDED FOR GOUT. 02/03/18  Yes [name], MD  rosuvastatin (CRESTOR) 10 MG tablet Take 1 tablet (10 mg total) by mouth daily. 10/14/17 10/14/18 Yes Gollan, Tollie Pizza, MD  sacubitril-valsartan (ENTRESTO) 24-26 MG Take 1 tablet by mouth 2 (two) times daily. 05/26/17  Yes Gollan, Tollie Pizza, MD  tiotropium (SPIRIVA HANDIHALER) 18 MCG inhalation capsule Place 1 capsule (18 mcg total) into inhaler and inhale daily. 10/03/16  Yes [name], MD  traMADol (ULTRAM) 50 MG tablet TAKE 2 TABLETS BY MOUTH EVERY 12 HOURS AS NEEDED FOR  KNEE  PAIN 03/09/18  Yes [name], MD  triamcinolone cream (KENALOG) 0.1 % APPLY TOPICALLY  DAILY AS NEEDED 10/23/17  Yes [name], MD    Family History  Adopted: Yes  Family history unknown: Yes     Social History   Tobacco Use  . Smoking status: Former Smoker    Years: 55.00    Types: Cigarettes    Last attempt to quit: 06/27/2014    Years since quitting: 3.7  . Smokeless tobacco: Never Used  Substance Use Topics  . Alcohol use: Yes    Alcohol/week: 15.0 standard drinks    Types: 15 Cans of beer per week    Comment: 2-3 beers a day average  . Drug use: No    Allergies as of 04/06/2018 - Review Complete 04/06/2018  Allergen Reaction Noted  . Codeine  02/01/2016  . Indocin [indomethacin]  06/05/2017    Review of Systems:    All systems reviewed and negative except where noted in HPI.   Physical Exam:  BP 101/69   Pulse 73   Ht 5\' 8"  (1.727 m)   Wt 177 lb 12.8 oz (80.6 kg)   BMI 27.03 kg/m  No LMP for male patient. Psych:  Alert and cooperative. Normal mood and affect. General:   Alert,  Well-developed, well-nourished, pleasant and cooperative in NAD Head:  Normocephalic and atraumatic. Eyes:  Sclera clear, no icterus.   Conjunctiva pink. Ears:  Normal auditory acuity. Nose:  No deformity, discharge, or lesions. Mouth:  No deformity or lesions,oropharynx pink & moist. Neck:  Supple; no masses or thyromegaly. Abdomen:  Normal bowel sounds.  No bruits.  Soft, non-tender and non-distended without masses, hepatosplenomegaly or hernias noted.  No guarding or rebound tenderness.    Msk:  Symmetrical without gross deformities. Good, equal movement & strength bilaterally. Pulses:  Normal pulses noted. Extremities:  No clubbing or edema.  No cyanosis. Neurologic:  Alert and oriented x3;  grossly normal neurologically. Skin:  Intact without significant lesions or rashes. No jaundice. Lymph Nodes:  No significant cervical adenopathy. Psych:  Alert and cooperative. Normal mood and affect.   Labs: CBC    Component Value Date/Time   WBC 8.1 01/29/2018  0948   RBC 3.01 (L) 01/29/2018 0948   HGB 10.1 (L) 01/29/2018 0948   HCT 30.0 (L) 01/29/2018 0948   PLT 141.0 (L) 01/29/2018 0948   MCV 99.7 01/29/2018 0948   MCH 33.3 (H) 08/18/2014 0923   MCHC 33.8 01/29/2018 0948   RDW 13.3 01/29/2018 0948   RDW 13.4 08/18/2014 0923   LYMPHSABS 2.6 01/29/2018 0948   LYMPHSABS 2.5 08/18/2014 0923   MONOABS 0.6 01/29/2018 0948   EOSABS 0.1 01/29/2018 0948   EOSABS 0.3 08/18/2014 0923   BASOSABS 0.1 01/29/2018 0948   BASOSABS 0.0 08/18/2014 0923   CMP  Component Value Date/Time   NA 134 (L) 11/05/2017 1144   NA 141 07/18/2016 0841   K 4.0 11/05/2017 1144   CL 101 11/05/2017 1144   CO2 26 11/05/2017 1144   GLUCOSE 109 (H) 11/05/2017 1144   BUN 9 11/05/2017 1144   BUN 5 (L) 07/18/2016 0841   CREATININE 1.09 11/05/2017 1144   CALCIUM 8.6 11/05/2017 1144   PROT 5.7 (L) 11/05/2017 1144   PROT 5.4 (L) 07/18/2016 0841   ALBUMIN 3.5 11/05/2017 1144   ALBUMIN 3.6 07/18/2016 0841   AST 16 11/05/2017 1144   ALT 14 11/05/2017 1144   ALKPHOS 44 11/05/2017 1144   BILITOT 0.8 11/05/2017 1144   BILITOT 0.5 07/18/2016 0841   GFRNONAA 87 07/18/2016 0841   GFRAA 100 07/18/2016 0841    Imaging Studies: No results found.  Assessment and Plan:   CRAY PREW is a 72 y.o. y/o male has been referred for positive fecal occult blood test  Colonoscopy indicated due to positive blood test This was discussed with the patient in detail and he is agreeable to the colonoscopy We will need to obtain cardiac clearance.  Patient has A. fib and is on Eliquis.  Previous ferritin work-up, consistent with possible anemia of chronic disease Will refer to hematology due to anemia, and thrombocytopenia  I have discussed alternative options, risks & benefits,  which include, but are not limited to, bleeding, infection, perforation,respiratory complication & drug reaction.  The patient agrees with this plan & written consent will be obtained.     Dr Devin Ramsey  Speech recognition software was used to dictate the above note.

## 2018-04-09 ENCOUNTER — Other Ambulatory Visit: Payer: Self-pay

## 2018-04-09 DIAGNOSIS — R195 Other fecal abnormalities: Secondary | ICD-10-CM

## 2018-04-09 DIAGNOSIS — D696 Thrombocytopenia, unspecified: Secondary | ICD-10-CM

## 2018-04-09 DIAGNOSIS — D649 Anemia, unspecified: Secondary | ICD-10-CM

## 2018-04-10 ENCOUNTER — Telehealth: Payer: Self-pay | Admitting: Cardiovascular Disease

## 2018-04-10 NOTE — Telephone Encounter (Signed)
° °  [facility] Pre-operative Risk Assessment    Request for surgical clearance:  1. What type of surgery is being performed? Colonoscopy   2. When is this surgery scheduled? 05/27/18  3. What type of clearance is required (medical clearance vs. Pharmacy clearance to hold med vs. Both)? Both   4. Are there any medications that need to be held prior to surgery and how long? Eliquis   5. Practice name and name of physician performing surgery? [facility] Dr Bonna Gains   6. What is your office phone number [phone]   7.   What is your office fax number [phone]  8.   Anesthesia type (None, local, MAC, general) ?

## 2018-04-10 NOTE — Telephone Encounter (Signed)
Called to check on patient since being seen in April 2019. Patient states no chest pain but has had more shortness of breath. States he never remembers being this short of breath. Advised that it would be best to have appointment prior to providing clearance for colonoscopy. He was agreeable and scheduled to see Eula Listen on 04/29/18. Routing to [place] to make him aware.

## 2018-04-13 ENCOUNTER — Other Ambulatory Visit: Payer: Self-pay | Admitting: Family Medicine

## 2018-04-13 NOTE — Telephone Encounter (Signed)
Electronic refill request. TAC Cream Last office visit:   11/14/17 CPE Last Filled:    454 g 2 10/23/2017  Please advise.

## 2018-04-14 NOTE — Telephone Encounter (Signed)
Sent. Thanks.   

## 2018-04-19 ENCOUNTER — Other Ambulatory Visit: Payer: Self-pay | Admitting: Family Medicine

## 2018-04-27 ENCOUNTER — Encounter: Payer: Self-pay | Admitting: Physician Assistant

## 2018-04-27 NOTE — Progress Notes (Signed)
Cardiology Office Note Date:  04/29/2018  Patient ID:  [name], [name] 1945/09/17, MRN [id number] PCP:  [name], MD  Cardiologist:  Dr. [name], MD    Chief Complaint: Pre-procedure cardiac evaluation  History of Present Illness: Devin Ramsey is a 72 y.o. male with history of permanent Afib/flutter on Eliquis, HFrEF with prior ischemic evaluation being declined by the patient, LBBB, moderate aortic atherosclerosis, COPD secondary to prior tobacco abuse quitting in 2016, hypothyroidism, anemia, HTN, HLD, and gout who presents for pre-procedure cardiac evaluation.   Based on prior EKGs, it appears his Afib dates back to at least 06/2014. Reported echo from 06/2014 showed an EF of 35-40%, WMA consistent with bundle branch block, normal sized LA. EKG dates from 08/2014, 02/2015, and 03/2015 show sinus rhythm with a 1st degree AV block. Follow up echo in 03/2015 showed no improvement in his EF in sinus rhythm with an EF of 35-40%, mild concentric LVH, no RWMA, normal LV diastolic function parameters, mild to moderate AI, moderate MR, mildly to moderately dilated left atrium measuring 42 mm. Since then, it appears he has been in Afib/flutter.   Prior CT in 01/2015 showed extensive coronary artery calcification, aorta, as well as the orgin of the SMA and renal arteries.   He was last seen in the office in 08/2017 and reported stable SOB, no chest pain, and no edema. He was continued on Entresto, Elqiuis, Lipitor, Lasix prn, Toprol as well as his non-cardiac medications.   He has been referred to GI by his PCP for heme-positive stool and anemia with a HGB around 9-10 dating back to 10/2017 with a prior value of 13.7 in 07/2014. He was previously advised to undergo a colonoscopy in 2016 though did not follow through with this. He is now scheduled for a colonoscopy on 05/27/2018.    Labs: 01/2018 - HGB 10.1 10/2017 - LDL 59, potassium 4.0, SCr 1.09, AST/ALT normal, TSH normal  Patient comes in  today for preoperative cardiac evaluation.  He denies any chest pain.  He does state he has chronic dyspnea on exertion which has been stable over the past 12 months.  He indicates "I can't walk far."  He is unable to quantify this distance.  He also notes if he does try to ambulate his legs become aching and sore with associated weakness.  No prior ischemic evaluations.  No prior peripheral arterial disease evaluations.  It will be for years at this February since he last quit smoking.  He has not been evaluated by a pulmonologist.  He reports a pruritic, erythematous rash that has been present for at least 1 to 2 years of uncertain etiology.  He denies any BRBPR or melena.  No falls since he was last seen.  He does not check his blood pressures at home.  No dizziness, presyncope, or syncope.  No lower extremity swelling, orthopnea, or early satiety.  He reports she is uncertain if he wants to move forward with his colonoscopy.   Past Medical History:  Diagnosis Date  . Anemia   . Bilateral pleural effusion   . Chronic systolic CHF (congestive heart failure) (HCC)    a. TTE 2/16: EF of 35-40%, WMA consistent with BBB, normal sized LA; b. TTE 11/16: F of 35-40%, mild concentric LVH, no RWMA, normal LV diastolic function parameters, mild to moderate AI, moderate MR, mildly to moderately dilated left atrium measuring 42 mm  . COPD (chronic obstructive pulmonary disease) (HCC)   .  Essential hypertension   . Gout   . HLD (hyperlipidemia)   . Hypothyroidism   . LBBB (left bundle branch block)   . Permanent atrial fibrillation    a. CHADS2VSC => 4 (CHF, HTN, age x 1, vascular disease); b. on Eliquis  . Pneumonia 2016  . Tobacco abuse     Past Surgical History:  Procedure Laterality Date  . HEMORRHOID SURGERY    . SHOULDER SURGERY     right shoulder   . TONSILLECTOMY      Current Meds  Medication Sig  . albuterol (PROVENTIL HFA;VENTOLIN HFA) 108 (90 Base) MCG/ACT inhaler Inhale 1-2 puffs into  the lungs every 4 (four) hours as needed.   Marland Kitchen apixaban (ELIQUIS) 5 MG TABS tablet Take 1 tablet (5 mg total) by mouth 2 (two) times daily.  . clobetasol cream (TEMOVATE) 0.05 % Apply 1 application topically 2 (two) times daily as needed. Use the least amount possible.  . cyclobenzaprine (FLEXERIL) 10 MG tablet Take 0.5-1 tablets (5-10 mg total) by mouth 3 (three) times daily as needed for muscle spasms. Sedation caution  . furosemide (LASIX) 20 MG tablet Take 1 tablet (20 mg total) by mouth daily as needed.  . hydrOXYzine (ATARAX/VISTARIL) 10 MG tablet Take 0.5-1 tablets (5-10 mg total) by mouth every 8 (eight) hours as needed for itching.  . levothyroxine (SYNTHROID, LEVOTHROID) 50 MCG tablet TAKE 1 TABLET BY MOUTH ONCE DAILY  . loratadine (CLARITIN) 10 MG tablet Take 1 tablet (10 mg total) by mouth daily.  . metoprolol succinate (TOPROL XL) 25 MG 24 hr tablet Take 1 tablet (25 mg total) by mouth daily.  . potassium chloride SA (KLOR-CON M20) 20 MEQ tablet Take 0.5 tablets (10 mEq total) by mouth daily as needed (with lasix).  . predniSONE (DELTASONE) 10 MG tablet TAKE 2 TABLETS BY MOUTH ONCE DAILY AS NEEDED FOR GOUT.  . rosuvastatin (CRESTOR) 10 MG tablet Take 1 tablet (10 mg total) by mouth daily.  . sacubitril-valsartan (ENTRESTO) 24-26 MG Take 1 tablet by mouth 2 (two) times daily.  Marland Kitchen tiotropium (SPIRIVA HANDIHALER) 18 MCG inhalation capsule Place 1 capsule (18 mcg total) into inhaler and inhale daily.  . traMADol (ULTRAM) 50 MG tablet TAKE 2 TABLETS BY MOUTH EVERY 12 HOURS AS NEEDED FOR  KNEE  PAIN  . triamcinolone cream (KENALOG) 0.1 %  APPLY TOPICALLY DAILY AS NEEDED    Allergies:   Codeine and Indocin [indomethacin]   Social History:  The patient  reports that he quit smoking about 3 years ago. His smoking use included cigarettes. He quit after 55.00 years of use. He has never used smokeless tobacco. He reports that he drinks about 15.0 standard drinks of alcohol per week. He reports  that he does not use drugs.   Family History:  The patient's He was adopted. Family history is unknown by patient.  ROS:   Review of Systems  Constitutional: Positive for malaise/fatigue. Negative for chills, diaphoresis, fever and weight loss.  HENT: Negative for congestion.   Eyes: Negative for discharge and redness.  Respiratory: Positive for shortness of breath. Negative for cough, hemoptysis, sputum production and wheezing.   Cardiovascular: Positive for claudication. Negative for chest pain, palpitations, orthopnea, leg swelling and PND.  Gastrointestinal: Positive for constipation. Negative for abdominal pain, blood in stool, diarrhea, heartburn, melena, nausea and vomiting.  Genitourinary: Negative for hematuria.  Musculoskeletal: Negative for falls and myalgias.  Skin: Negative for rash.  Neurological: Positive for weakness. Negative for dizziness, tingling, tremors, sensory  change, speech change, focal weakness and loss of consciousness.  Endo/Heme/Allergies: Does not bruise/bleed easily.  Psychiatric/Behavioral: Negative for substance abuse. The patient is not nervous/anxious.   All other systems reviewed and are negative.    PHYSICAL EXAM:  VS:  BP (!) 103/52 (BP Location: Left Arm, Patient Position: Sitting, Cuff Size: Normal)   Pulse 62   Ht 5\' 10"  (1.778 m)   Wt 175 lb 8 oz (79.6 kg)   SpO2 93%   BMI 25.18 kg/m  BMI: Body mass index is 25.18 kg/m.  Physical Exam  Constitutional: He is oriented to person, place, and time. He appears well-developed and well-nourished.  HENT:  Head: Normocephalic and atraumatic.  Eyes: Right eye exhibits no discharge. Left eye exhibits no discharge.  Neck: Normal range of motion. No JVD present.  Cardiovascular: Normal rate, S1 normal and S2 normal. An irregularly irregular rhythm present. Exam reveals no distant heart sounds, no friction rub, no midsystolic click and no opening snap.  Murmur heard. High-pitched blowing holosystolic  murmur is present with a grade of 2/6 at the apex. Pulses:      Posterior tibial pulses are 1+ on the right side, and 1+ on the left side.  Pulmonary/Chest: Effort normal and breath sounds normal. No respiratory distress. He has no decreased breath sounds. He has no wheezes. He has no rales. He exhibits no tenderness.  Abdominal: Soft. He exhibits no distension. There is no tenderness.  Musculoskeletal: He exhibits no edema.  Neurological: He is alert and oriented to person, place, and time.  Skin: Skin is warm and dry. No cyanosis. Nails show no clubbing.  Psychiatric: He has a normal mood and affect. His speech is normal and behavior is normal. Judgment and thought content normal.     EKG:  Was ordered and interpreted by me today. Shows A. fib, 62 bpm, LBBB (known)  Recent Labs: 11/05/2017: ALT 14; BUN 9; Creatinine, Ser 1.09; Potassium 4.0; Sodium 134; TSH 3.22 01/29/2018: Hemoglobin 10.1; Platelets 141.0  11/05/2017: Cholesterol 148; HDL 77.40; LDL Cholesterol 59; Total CHOL/HDL Ratio 2; Triglycerides 55.0; VLDL 11.0   CrCl cannot be calculated (Patient's most recent lab result is older than the maximum 21 days allowed.).   Wt Readings from Last 3 Encounters:  04/29/18 175 lb 8 oz (79.6 kg)  04/06/18 177 lb 12.8 oz (80.6 kg)  11/14/17 182 lb 8 oz (82.8 kg)     Other studies reviewed: Additional studies/records reviewed today include: summarized above  ASSESSMENT AND PLAN:  1. Preoperative cardiac evaluation: Patient is scheduled for a diagnostic colonoscopy on 05/27/2018 in the setting of anemia and Hemoccult-positive stools.  He denies any chest pain though does note stable dyspnea on exertion.  He has a known cardiomyopathy with an EF of 35 to 40% without prior ischemic evaluation.  In this setting, I have recommended we perform an echocardiogram as well as a Lexiscan Myoview to further evaluate his cardiomyopathy and for possible high risk ischemia.  Further risk stratification will  be outlined pending these results.  2. Permanent Afib/flutter: Ventricular rates are well controlled.  Continue Toprol-XL for rate control.  Continue Eliquis 5 mg twice daily given his CHADS2VASc of at least 4 (CHF, HTN, age x 1, vascular disease).  Following further risk stratification for his upcoming colonoscopy, he would need to hold Eliquis 2 days prior to his scheduled colonoscopy with recommendation to resume Eliquis as soon as felt to be safely possible by the performing physician.  3. HFrEF/dyspnea on exertion:  He does not appear grossly volume overloaded at this time.  In fact, his weight is down 8 pounds from his last office visit with [place].  This is unintentional weight loss.  Remains on Toprol-XL and Entresto.  Due to relative hypotension we are unable to escalate either of these medications or add Spironolactone at this time.  Schedule echocardiogram given dyspnea on exertion.  Patient is known to have a cardiomyopathy that dates back to early 2016 with prior declining of ischemic evaluation including invasive and noninvasive modalities.  I have discussed this with him today in the setting of his risk factors and objective findings of aortic atherosclerosis with coronary artery calcifications noted on prior imaging and we have agreed to proceed with a Lexiscan Myoview to evaluate for high risk ischemia.  This will be completed prior to his scheduled colonoscopy for further risk stratification.  On Eliquis in place of aspirin.  CHF education.  4. Claudication: Schedule lower extremity arterial ultrasound and ABI.  5. Aortic atherosclerosis/coronary artery calcification: LDL at goal as below and patient remains on Crestor.  Ischemic evaluation as above with Lexiscan Myoview.  Continue risk factor modification including optimal blood pressure and lipid control.  He quit smoking in 06/2014.  6. COPD: Does not appear to be having an active exacerbation.  Following the above cardiac work-up would  recommend referral to pulmonology to establish care.  7. HTN: Blood pressure is well controlled today.  Remains on Toprol-XL, Entresto, and PRN Lasix.  8. HLD: LDL of 59 from 10/2017.  Remains on Crestor.  9. Rash: Managed by PCP.  Disposition: F/u with Dr. [name] or an APP in 3 months.  Current medicines are reviewed at length with the patient today.  The patient did not have any concerns regarding medicines.  Signed, Eula Listen, PA-C 04/29/2018 11:25 AM     [facility] [place] [address] [place], [place] [address] [phone]

## 2018-04-29 ENCOUNTER — Encounter: Payer: Self-pay | Admitting: Physician Assistant

## 2018-04-29 ENCOUNTER — Ambulatory Visit (INDEPENDENT_AMBULATORY_CARE_PROVIDER_SITE_OTHER): Payer: Medicare Other | Admitting: Physician Assistant

## 2018-04-29 VITALS — BP 103/52 | HR 62 | Ht 70.0 in | Wt 175.5 lb

## 2018-04-29 DIAGNOSIS — I5022 Chronic systolic (congestive) heart failure: Secondary | ICD-10-CM | POA: Diagnosis not present

## 2018-04-29 DIAGNOSIS — Z01810 Encounter for preprocedural cardiovascular examination: Secondary | ICD-10-CM | POA: Diagnosis not present

## 2018-04-29 DIAGNOSIS — R0609 Other forms of dyspnea: Secondary | ICD-10-CM | POA: Diagnosis not present

## 2018-04-29 DIAGNOSIS — I4821 Permanent atrial fibrillation: Secondary | ICD-10-CM | POA: Diagnosis not present

## 2018-04-29 DIAGNOSIS — E785 Hyperlipidemia, unspecified: Secondary | ICD-10-CM

## 2018-04-29 DIAGNOSIS — I739 Peripheral vascular disease, unspecified: Secondary | ICD-10-CM

## 2018-04-29 DIAGNOSIS — I10 Essential (primary) hypertension: Secondary | ICD-10-CM

## 2018-04-29 DIAGNOSIS — R21 Rash and other nonspecific skin eruption: Secondary | ICD-10-CM

## 2018-04-29 DIAGNOSIS — J449 Chronic obstructive pulmonary disease, unspecified: Secondary | ICD-10-CM

## 2018-04-29 NOTE — Patient Instructions (Signed)
Medication Instructions:  Your physician recommends that you continue on your current medications as directed. Please refer to the Current Medication list given to you today.  If you need a refill on your cardiac medications before your next appointment, please call your pharmacy.   Lab work: None today   If you have labs (blood work) drawn today and your tests are completely normal, you will receive your results only by: Marland Kitchen MyChart Message (if you have MyChart) OR . A paper copy in the mail If you have any lab test that is abnormal or we need to change your treatment, we will call you to review the results.  Testing/Procedures:  1- Echocardiogram  Your physician has requested that you have an echocardiogram. Echocardiography is a painless test that uses sound waves to create images of your heart. It provides your doctor with information about the size and shape of your heart and how well your heart's chambers and valves are working. This procedure takes approximately one hour. There are no restrictions for this procedure.  2- Lexiscan/ Myoview [facility] MYOVIEW  Your caregiver has ordered a Stress Test with nuclear imaging. The purpose of this test is to evaluate the blood supply to your heart muscle. This procedure is referred to as a "Non-Invasive Stress Test." This is because other than having an IV started in your vein, nothing is inserted or "invades" your body. Cardiac stress tests are done to find areas of poor blood flow to the heart by determining the extent of coronary artery disease (CAD). Some patients exercise on a treadmill, which naturally increases the blood flow to your heart, while others who are  unable to walk on a treadmill due to physical limitations have a pharmacologic/chemical stress agent called Lexiscan . This medicine will mimic walking on a treadmill by temporarily increasing your coronary blood flow.   Please note: these test may take anywhere between 2-4 hours to  complete  PLEASE REPORT TO [facility] ENTRANCE  THE VOLUNTEERS AT THE FIRST DESK WILL DIRECT YOU WHERE TO GO  Date of Procedure:_____________________________________  Arrival Time for Procedure:______________________________  Instructions regarding medication:   __x__:  Hold other medications as follows:_____Furosemide___  PLEASE NOTIFY THE OFFICE AT LEAST 24 HOURS IN ADVANCE IF YOU ARE UNABLE TO KEEP YOUR APPOINTMENT.  [phone] AND  PLEASE NOTIFY NUCLEAR MEDICINE AT [facility] AT LEAST 24 HOURS IN ADVANCE IF YOU ARE UNABLE TO KEEP YOUR APPOINTMENT. [phone]  How to prepare for your Myoview test:  1. Do not eat or drink after midnight 2. No caffeine for 24 hours prior to test 3. No smoking 24 hours prior to test. 4. Your medication may be taken with water.  If your doctor stopped a medication because of this test, do not take that medication. 5. Ladies, please do not wear dresses.  Skirts or pants are appropriate. Please wear a short sleeve shirt. 6. No perfume, cologne or lotion. 7. Wear comfortable walking shoes. No heels!    3- Lower Extremity Ultrasound/ABIs Your physician has requested that you have a lower extremity arterial exercise duplex. During this test, exercise and ultrasound are used to evaluate arterial blood flow in the legs. Allow one hour for this exam. There are no restrictions or special instructions. Your physician has requested that you have an ankle brachial index (ABI). During this test an ultrasound and blood pressure cuff are used to evaluate the arteries that supply the arms and legs with blood. Allow thirty minutes for this exam. There  are no restrictions or special instructions.    Follow-Up: At [facility], you and your health needs are our priority.  As part of our continuing mission to provide you with exceptional heart care, we have created designated Provider Care Teams.  These Care Teams include your primary Cardiologist (physician)  and Advanced Practice Providers (APPs -  Physician Assistants and Nurse Practitioners) who all work together to provide you with the care you need, when you need it. You will need a follow up appointment in 1 months. You may see Eula Listen or one of the following Advanced Practice Providers on your designated Care Team:   Nicolasa Ducking, NP Eula Listen, PA-C . Marisue Ivan, PA-C

## 2018-05-04 ENCOUNTER — Telehealth: Payer: Self-pay | Admitting: Cardiovascular Disease

## 2018-05-04 NOTE — Telephone Encounter (Signed)
Pt states he fell yesterday and asks should he still have his stress test tomorrow. Please call and advise.

## 2018-05-04 NOTE — Telephone Encounter (Signed)
I spoke with the patient. He states he fell getting out of the car this morning and his back hurts. I advised him that he won't be on the treadmill tomorrow but he will need to be able to lay flat under a camera for pictures. He would like to r/s his lexiscan myoview. I have advised this will most likely be best. This has been r/s to 05/15/18 at 9:00 am.

## 2018-05-05 ENCOUNTER — Ambulatory Visit: Payer: Medicare Other

## 2018-05-06 ENCOUNTER — Other Ambulatory Visit: Payer: Self-pay | Admitting: Physician Assistant

## 2018-05-06 DIAGNOSIS — I739 Peripheral vascular disease, unspecified: Secondary | ICD-10-CM

## 2018-05-18 ENCOUNTER — Other Ambulatory Visit: Payer: Self-pay | Admitting: Family Medicine

## 2018-05-25 ENCOUNTER — Telehealth: Payer: Self-pay | Admitting: Gastroenterology

## 2018-05-25 ENCOUNTER — Telehealth: Payer: Self-pay

## 2018-05-25 NOTE — Telephone Encounter (Signed)
Pt is calling to cancel his procedure 05/27/18 he had something coming up and he will call the Doctor back to r/s

## 2018-05-25 NOTE — Telephone Encounter (Signed)
L MOM for pt to call and schedule stress test

## 2018-05-25 NOTE — Telephone Encounter (Signed)
-----   Message from [place].Jodelle Gross, RN sent at 05/22/2018  2:36 PM EST ----- Mr. Hartfield was ordered on 04/29/18 to have Lexi scan. Can you help me get this scheduled?   Thanks, Iva

## 2018-05-26 NOTE — Telephone Encounter (Signed)
Patient schedule 1/9 for lexi

## 2018-05-26 NOTE — Telephone Encounter (Signed)
[facility] notified of pt canceling procedure.

## 2018-05-27 ENCOUNTER — Telehealth: Payer: Self-pay | Admitting: *Deleted

## 2018-05-27 ENCOUNTER — Encounter: Admission: RE | Payer: Self-pay | Source: Home / Self Care

## 2018-05-27 ENCOUNTER — Ambulatory Visit: Admission: RE | Admit: 2018-05-27 | Payer: Medicare Other | Source: Home / Self Care | Admitting: Gastroenterology

## 2018-05-27 SURGERY — COLONOSCOPY WITH PROPOFOL
Anesthesia: General

## 2018-05-27 NOTE — Telephone Encounter (Signed)
-----   Message from Kendrick Fries, [place] sent at 05/26/2018 11:09 AM EST ----- Pt is f/u with [facility] 06/05/2018. Pt is f/u from cardiac testing echo and lower extremity u/s. Pt cancelled Myoview.  Please advise if pt should keep appointment or reschedule myoview/appointment.   Thank you, Jearld Adjutant

## 2018-05-27 NOTE — Telephone Encounter (Signed)
Spoke with patient and reviewed appointment for stress testing tomorrow at [facility] entrance. He confirmed with no further questions at this time.

## 2018-05-28 ENCOUNTER — Encounter
Admission: RE | Admit: 2018-05-28 | Discharge: 2018-05-28 | Disposition: A | Discharge status: Home / Self Care | Payer: Medicare Other | Source: Ambulatory Visit | Attending: Physician Assistant | Admitting: Physician Assistant

## 2018-05-28 DIAGNOSIS — R0609 Other forms of dyspnea: Secondary | ICD-10-CM | POA: Insufficient documentation

## 2018-05-29 ENCOUNTER — Ambulatory Visit (INDEPENDENT_AMBULATORY_CARE_PROVIDER_SITE_OTHER): Payer: Medicare Other

## 2018-05-29 ENCOUNTER — Encounter: Payer: Self-pay | Admitting: Cardiovascular Disease

## 2018-05-29 ENCOUNTER — Other Ambulatory Visit: Payer: Self-pay

## 2018-05-29 ENCOUNTER — Telehealth: Payer: Self-pay | Admitting: *Deleted

## 2018-05-29 ENCOUNTER — Ambulatory Visit (INDEPENDENT_AMBULATORY_CARE_PROVIDER_SITE_OTHER): Payer: Medicare Other | Admitting: Cardiovascular Disease

## 2018-05-29 VITALS — BP 109/62 | HR 64 | Ht 70.0 in | Wt 174.0 lb

## 2018-05-29 DIAGNOSIS — E785 Hyperlipidemia, unspecified: Secondary | ICD-10-CM

## 2018-05-29 DIAGNOSIS — I10 Essential (primary) hypertension: Secondary | ICD-10-CM | POA: Diagnosis not present

## 2018-05-29 DIAGNOSIS — I739 Peripheral vascular disease, unspecified: Secondary | ICD-10-CM | POA: Diagnosis not present

## 2018-05-29 DIAGNOSIS — R0609 Other forms of dyspnea: Secondary | ICD-10-CM | POA: Diagnosis not present

## 2018-05-29 DIAGNOSIS — I4821 Permanent atrial fibrillation: Secondary | ICD-10-CM

## 2018-05-29 DIAGNOSIS — I5022 Chronic systolic (congestive) heart failure: Secondary | ICD-10-CM | POA: Diagnosis not present

## 2018-05-29 DIAGNOSIS — L600 Ingrowing nail: Secondary | ICD-10-CM

## 2018-05-29 MED ORDER — CEPHALEXIN 500 MG PO CAPS: 500.0000 mg | ORAL_CAPSULE | Freq: Three times a day (TID) | ORAL | 0 refills | Status: DC

## 2018-05-29 NOTE — Progress Notes (Signed)
Cardiology Office Note   Date:  05/29/2018   ID:  [name], [name] 1945-07-05, MRN [id number]  PCP:  [name], MD  Cardiologist:  Dr. [name]  Chief Complaint  Patient presents with  . other    worsening toe discoloration. Medications reviewed verbally.       History of Present Illness: Devin Ramsey is a 73 y.o. male who was referred by Dr. [name] for evaluation management of peripheral arterial disease.  He has known history of permanent atrial fibrillation on long-term anticoagulation with Eliquis, chronic systolic heart failure, left bundle branch block, COPD due to prior tobacco use, hypothyroidism, anemia, hypertension and hyperlipidemia. Echocardiogram in November 2016 showed an EF of 35 to 40%, mild to moderate aortic regurgitation, moderate mitral regurgitation and dilated left atrium.  Prior CT showed extensive coronary artery calcification as well as aortic calcifications. The patient was recently referred for preoperative cardiovascular evaluation for heme positive stool and anemia with hemoglobin between 9 and 10.  He has very poor functional capacity.  Lexiscan Myoview was recommended but the patient canceled due to a fall.  The patient complained of bilateral leg pain with walking and thus he underwent vascular studies today.  ABI was moderately reduced at 0.5 on the right and 0.48 on the left.  Duplex showed bilateral SFA occlusion with tibial disease as well. The patient reports prolonged history of ingrown toenail affecting the second left toe.  He has not cut the toenail in a long time.  He reports seeing Dr. [name] more than 4 years ago for this issue.  The patient declined intervention at that time.  He started having pain in that toe recently with redness that developed recently.  No open ulceration.  He reports very mild bilateral leg pain with walking.  His functional capacity is very poor at baseline.   Past Medical History:  Diagnosis Date  . Anemia     . Bilateral pleural effusion   . Chronic systolic CHF (congestive heart failure) (HCC)    a. TTE 2/16: EF of 35-40%, WMA consistent with BBB, normal sized LA; b. TTE 11/16: F of 35-40%, mild concentric LVH, no RWMA, normal LV diastolic function parameters, mild to moderate AI, moderate MR, mildly to moderately dilated left atrium measuring 42 mm  . COPD (chronic obstructive pulmonary disease) (HCC)   . Essential hypertension   . Gout   . HLD (hyperlipidemia)   . Hypothyroidism   . LBBB (left bundle branch block)   . Permanent atrial fibrillation    a. CHADS2VSC => 4 (CHF, HTN, age x 1, vascular disease); b. on Eliquis  . Pneumonia 2016  . Tobacco abuse     Past Surgical History:  Procedure Laterality Date  . HEMORRHOID SURGERY    . SHOULDER SURGERY     right shoulder   . TONSILLECTOMY       Current Outpatient Medications  Medication Sig Dispense Refill  . albuterol (PROVENTIL HFA;VENTOLIN HFA) 108 (90 Base) MCG/ACT inhaler Inhale 1-2 puffs into the lungs every 4 (four) hours as needed.     Marland Kitchen apixaban (ELIQUIS) 5 MG TABS tablet Take 1 tablet (5 mg total) by mouth 2 (two) times daily. 180 tablet 3  . clobetasol cream (TEMOVATE) 0.05 % Apply 1 application topically 2 (two) times daily as needed. Use the least amount possible. 45 g 2  . cyclobenzaprine (FLEXERIL) 10 MG tablet Take 0.5-1 tablets (5-10 mg total) by mouth 3 (three) times daily as needed  for muscle spasms. Sedation caution 30 tablet 1  . furosemide (LASIX) 20 MG tablet Take 1 tablet (20 mg total) by mouth daily as needed. 90 tablet 3  . hydrOXYzine (ATARAX/VISTARIL) 10 MG tablet TAKE 1/2 TO 1 (ONE-HALF TO ONE) TABLET BY MOUTH EVERY 8 HOURS AS NEEDED FOR ITCHING 30 tablet 0  . levothyroxine (SYNTHROID, LEVOTHROID) 50 MCG tablet TAKE 1 TABLET BY MOUTH ONCE DAILY 90 tablet 1  . loratadine (CLARITIN) 10 MG tablet Take 1 tablet (10 mg total) by mouth daily. 90 tablet 3  . metoprolol succinate (TOPROL XL) 25 MG 24 hr tablet  Take 1 tablet (25 mg total) by mouth daily. 90 tablet 3  . potassium chloride SA (KLOR-CON M20) 20 MEQ tablet Take 0.5 tablets (10 mEq total) by mouth daily as needed (with lasix).    . predniSONE (DELTASONE) 10 MG tablet TAKE 2 TABLETS BY MOUTH ONCE DAILY AS NEEDED FOR GOUT. 20 tablet 0  . rosuvastatin (CRESTOR) 10 MG tablet Take 1 tablet (10 mg total) by mouth daily. 90 tablet 3  . sacubitril-valsartan (ENTRESTO) 24-26 MG Take 1 tablet by mouth 2 (two) times daily. 180 tablet 3  . tiotropium (SPIRIVA HANDIHALER) 18 MCG inhalation capsule Place 1 capsule (18 mcg total) into inhaler and inhale daily. 30 capsule 12  . traMADol (ULTRAM) 50 MG tablet TAKE 2 TABLETS BY MOUTH EVERY 12 HOURS AS NEEDED FOR  KNEE  PAIN 120 tablet 1  . triamcinolone cream (KENALOG) 0.1 %  APPLY TOPICALLY DAILY AS NEEDED 454 g 2   No current facility-administered medications for this visit.     Allergies:   Codeine and Indocin [indomethacin]    Social History:  The patient  reports that he quit smoking about 3 years ago. His smoking use included cigarettes. He quit after 55.00 years of use. He has never used smokeless tobacco. He reports current alcohol use of about 15.0 standard drinks of alcohol per week. He reports that he does not use drugs.   Family History:  The patient's He was adopted. Family history is unknown by patient.    ROS:  Please see the history of present illness.   Otherwise, review of systems are positive for none.   All other systems are reviewed and negative.    PHYSICAL EXAM: VS:  BP 109/62 (BP Location: Left Arm, Patient Position: Sitting, Cuff Size: Normal)   Pulse 64   Ht 5\' 10"  (1.778 m)   Wt 174 lb (78.9 kg)   SpO2 92%   BMI 24.97 kg/m  , BMI Body mass index is 24.97 kg/m. GEN: Well nourished, well developed, in no acute distress  HEENT: normal  Neck: no JVD, carotid bruits, or masses Cardiac: RRR; no murmurs, rubs, or gallops,no edema  Respiratory:  clear to auscultation  bilaterally, normal work of breathing GI: soft, nontender, nondistended, + BS MS: no deformity or atrophy  Skin: warm and dry, no rash Neuro:  Strength and sensation are intact Psych: euthymic mood, full affect Vascular: Distal pulses are not palpable.  There is an ingrown toenail affecting the second left toe with redness affecting the toe but no open ulceration.   EKG:  EKG is not ordered today.   Recent Labs: 11/05/2017: ALT 14; BUN 9; Creatinine, Ser 1.09; Potassium 4.0; Sodium 134; TSH 3.22 01/29/2018: Hemoglobin 10.1; Platelets 141.0    Lipid Panel    Component Value Date/Time   CHOL 148 11/05/2017 1144   CHOL 116 07/18/2016 0841   TRIG 55.0 11/05/2017  1144   HDL 77.40 11/05/2017 1144   HDL 41 07/18/2016 0841   CHOLHDL 2 11/05/2017 1144   VLDL 11.0 11/05/2017 1144   LDLCALC 59 11/05/2017 1144   LDLCALC 34 07/18/2016 0841      Wt Readings from Last 3 Encounters:  05/29/18 174 lb (78.9 kg)  04/29/18 175 lb 8 oz (79.6 kg)  04/06/18 177 lb 12.8 oz (80.6 kg)       No flowsheet data found.    ASSESSMENT AND PLAN:  1.  Peripheral arterial disease: The patient seems to have minimal claudication likely due to his poor functional capacity.  His biggest issue seems to be an ingrown toenail affecting the left second toe with possible cellulitis in that area.  I elected to start him on Keflex for 5 days.  I am going to refer him to podiatry to help manage this.  The patient currently does not have any open ulceration but if there is any poor healing or plans for surgery on his toe, he will require an angiogram.  I will have him follow-up with me in few weeks.  2.  Permanent atrial fibrillation: Ventricular rate is controlled and he is on long-term anticoagulation with Eliquis.   3.  Chronic systolic heart failure: He appears to be euvolemic on optimal heart failure medications.  4.  Coronary artery disease: He likely has underlying coronary artery disease and he is  scheduled for a Lexiscan Myoview on Monday.  5.  Essential hypertension: Blood pressure is controlled.  6.  Hyperlipidemia: Currently on rosuvastatin.  Recommend a target LDL of less than 70.    Disposition:   FU with me in 3 weeks  Signed,  Lorine Bears, MD  05/29/2018 1:49 PM    [facility]

## 2018-05-29 NOTE — Patient Instructions (Signed)
Medication Instructions:  START Keflex 500 mg three times daily for 5 days  If you need a refill on your cardiac medications before your next appointment, please call your pharmacy.   Lab work: None ordered  Testing/Procedures: None ordered  Follow-Up: You will need a follow up appointment in 3 weeks with Dr. Kirke Corin  Special Instructions: A referral has been placed for [facility]

## 2018-05-29 NOTE — Telephone Encounter (Signed)
The patient came in for a doppler and duplex test today, 05/29/2018. It was noted by the technician that the patient has discoloration on his second toe on his left foot. The patient stated that the toe has gotten worse over the last two days. There was a distinct odor to the toe.  A consult appointment has been made with Dr. Kirke Corin today at 1:40 for further asssessment.

## 2018-05-29 NOTE — Progress Notes (Unsigned)
   Patient ID: Devin Ramsey, male    DOB: 22-Dec-1945, 73 y.o.   MRN: [id number]  Patient has been scheduled for a PV consult after a preliminary finding of severe arterial bloodflow comrpomise in the bilateral lower extremities. He also has a non-healing wound on his left second toe.     Review of Systems    Physical Exam

## 2018-06-01 ENCOUNTER — Telehealth: Payer: Self-pay | Admitting: *Deleted

## 2018-06-01 NOTE — Telephone Encounter (Signed)
-----   Message from Sondra Barges, PA-C sent at 06/01/2018  1:17 PM EST ----- Echo showed a reduced pump function of 35-40%, mild thickening of the heart, mildly leaky aortic valve, moderately leaky mitral valve, moderately dilated left atrium.   Compared to echo from 2016 EF remains the same, leaky aortic valve is about the same to possibly slightly improved, and the leaky mitral valve is stable.   Patient was a no show for Lexiscan today. Please see if he would like to reschedule this.   Continue Entresto and Toprol.

## 2018-06-01 NOTE — Telephone Encounter (Signed)
Results called to pt. Pt verbalized understanding of echo results.  He states he forgot about the myoview today. He is agreeable to reschedule for 06/03/18 at 9:30, arrival time of 09:15 am. He wrote down and verbalized understanding of the instructions below. He was appreciative.    [facility] MYOVIEW  Your caregiver has ordered a Stress Test with nuclear imaging. The purpose of this test is to evaluate the blood supply to your heart muscle. This procedure is referred to as a "Non-Invasive Stress Test." This is because other than having an IV started in your vein, nothing is inserted or "invades" your body. Cardiac stress tests are done to find areas of poor blood flow to the heart by determining the extent of coronary artery disease (CAD). Some patients exercise on a treadmill, which naturally increases the blood flow to your heart, while others who are  unable to walk on a treadmill due to physical limitations have a pharmacologic/chemical stress agent called Lexiscan . This medicine will mimic walking on a treadmill by temporarily increasing your coronary blood flow.   Please note: these test may take anywhere between 2-4 hours to complete  PLEASE REPORT TO [facility]TR[facility]  THE VOLUNTEERS AT THE FIRST DESK WILL DIRECT YOU WHERE TO GO  Date of Procedure:_______1/15/2020______________  Arrival Time for Procedure:_____09:15am__________  Instructions regarding medication:   _xx_:  Hold other medications as follows:____furosemide______  PLEASE NOTIFY THE OFFICE AT LEAST 24 HOURS IN ADVANCE IF YOU ARE UNABLE TO KEEP YOUR APPOINTMENT.  [phone] AND  PLEASE NOTIFY NUCLEAR MEDICINE AT [facility] AT LEAST 24 HOURS IN ADVANCE IF YOU ARE UNABLE TO KEEP YOUR APPOINTMENT. [phone]  How to prepare for your Myoview test:  1. Do not eat or drink after midnight 2. No caffeine for 24 hours prior to test 3. No smoking 24 hours prior to test. 4. Your medication may be taken with water.  If your  doctor stopped a medication because of this test, do not take that medication. 5. Ladies, please do not wear dresses.  Skirts or pants are appropriate. Please wear a short sleeve shirt. 6. No perfume, cologne or lotion. 7. Wear comfortable walking shoes.

## 2018-06-03 ENCOUNTER — Encounter
Admission: RE | Admit: 2018-06-03 | Discharge: 2018-06-03 | Disposition: A | Discharge status: Home / Self Care | Payer: Medicare Other | Source: Ambulatory Visit | Attending: Physician Assistant | Admitting: Physician Assistant

## 2018-06-03 DIAGNOSIS — R0609 Other forms of dyspnea: Secondary | ICD-10-CM

## 2018-06-03 MED ORDER — TECHNETIUM TC 99M TETROFOSMIN IV KIT
31.4800 | PACK | Freq: Once | INTRAVENOUS | Status: AC | PRN
  Administered 2018-06-03: 31.48 via INTRAVENOUS

## 2018-06-03 MED ORDER — REGADENOSON 0.4 MG/5ML IV SOLN
0.4000 mg | Freq: Once | INTRAVENOUS | Status: AC
  Administered 2018-06-03: 0.4 mg via INTRAVENOUS

## 2018-06-03 MED ORDER — TECHNETIUM TC 99M SESTAMIBI - CARDIOLITE
10.6300 | Freq: Once | INTRAVENOUS | Status: AC | PRN
  Administered 2018-06-03: 10.63 via INTRAVENOUS

## 2018-06-04 ENCOUNTER — Telehealth: Payer: Self-pay

## 2018-06-04 NOTE — Telephone Encounter (Signed)
Called pt to review results from stress test. Pt verbalized understanding and had no further questions at this time.  Advised pt to call for any further questions or concerns

## 2018-06-04 NOTE — Telephone Encounter (Signed)
-----   Message from Sondra Barges, PA-C sent at 06/04/2018  7:08 AM EST ----- Stress test showed no significant ischemia. Low risk scan.

## 2018-06-05 ENCOUNTER — Ambulatory Visit: Payer: Medicare Other | Admitting: Cardiovascular Disease

## 2018-06-06 ENCOUNTER — Other Ambulatory Visit: Payer: Self-pay | Admitting: Cardiovascular Disease

## 2018-06-08 NOTE — Progress Notes (Signed)
Cardiology Office Note Date:  06/10/2018  Patient ID:  Devin Ramsey, Devin Ramsey [birth year], MRN [id number] PCP:  [name], MD  Cardiologist:  Dr. [name], MD    Chief Complaint: Follow up  History of Present Illness: Devin Ramsey is a 73 y.o. male with history of permanent Afib/flutter on Eliquis, HFrEF felt to be secondary to NICM by recent stress testing, LBBB, moderate aortic atherosclerosis, COPD secondary to prior tobacco abuse quitting in 2016, hypothyroidism, anemia, HTN, HLD, and gout who presents for follow-up of his A. fib and nonischemic cardiomyopathy.   Based on prior EKGs, it appears his Afib dates back to at least 06/2014. Reported echo from 06/2014 showed an EF of 35-40%, WMA consistent with bundle branch block, normal sized LA. EKG dates from 08/2014, 02/2015, and 03/2015 show sinus rhythm with a 1st degree AV block. Follow up echo in 03/2015 showed no improvement in his EF in sinus rhythm with an EF of 35-40%, mild concentric LVH, no RWMA, normal LV diastolic function parameters, mild to moderate AI, moderate MR, mildly to moderately dilated left atrium measuring 42 mm. Since then, it appears he has been in Afib/flutter.   Prior CT in 01/2015 showed extensive coronary artery calcification, aorta, as well as the orgin of the SMA and renal arteries.   He was seen by his primary cardiologist in the office in 08/2017 and reported stable SOB, no chest pain, and no edema. He was continued on Entresto, Elqiuis, Lipitor, Lasix prn, Toprol as well as his non-cardiac medications.   He was recently seen by GI for heme-positive stool and anemia with a HGB around 9-10 dating back to 10/2017 with a prior value of 13.7 in 07/2014. He was previously advised to undergo a colonoscopy in 2016 though did not follow through with this. He was seen by cardiology on 04/29/18 for pre-procedural evaluation of colonoscopy and denied any chest pain. He did report chronic, stable DOE. He also noted  bilateral leg crampping with ambulation. He was uncertain if he wanted to proceed with his recommended colonoscopy. Given his cardiomyopathy and DOE we undertook an echo on 05/29/2018 that showed a low, though stable EF of 35-40%, mild concentric LVH, mild AI, moderate MR, moderately dilated LA. Follow up Myoview on 06/03/2018 showed no significant ischemia with a small region of fixed defect in the apical and apical anteroseptal region consistent with prior MI, EF 53%, low risk scan. He underwent lower extremity arterial studies on 05/29/2018 that showed a moderately reduced ABI at 0.5 on the right and 0.48 on the left. Duplex showed bilateral SFA occlusion with tibial disease. He was seen that day by Dr. Kirke Corin in vascular consult and reported a long history of an ingrown toenail that was affecting his second left toe. He was felt to have minimal claudication likely due to poor functional capacity. There was no ulceration noted of his lower extremities. With regards to his ingrown toenail, he was started on Keflex and referred to podiatry. It was noted should the patient have poor healing or plans for surgery of his toe, lower extremity angiogram would be required.  It was noted, he did not follow through with his recommended colonoscopy.   He comes in doing well from a cardiac perspective.  He continues to note a mildly erythematous and pruritic rash that is affecting the bilateral arms, both sides of his neck, and posterior neck which has been present and unchanged for at least the past 3 years.  This is  his primary complaint.  Shortness of breath is stable.  No lower extremity swelling, abdominal distention, orthopnea, PND, early satiety.  No chest pain.  No dizziness, presyncope, or syncope.  He continues to watch his salt and fluid intake.  He is compliant with all medications.  No falls since he was last seen.  He has not yet seen the podiatrist.  His mild claudication is unchanged.  He reports canceling his  colonoscopy and has no plans to reschedule this at this time.  Past Medical History:  Diagnosis Date  . Anemia   . Bilateral pleural effusion   . Chronic systolic CHF (congestive heart failure) (HCC)    a. TTE 2/16: EF of 35-40%, WMA consistent with BBB, normal sized LA; b. TTE 11/16: F of 35-40%, mild concentric LVH, no RWMA, normal LV diastolic function parameters, mild to moderate AI, moderate MR, mildly to moderately dilated left atrium measuring 42 mm  . COPD (chronic obstructive pulmonary disease) (HCC)   . Essential hypertension   . Gout   . HLD (hyperlipidemia)   . Hypothyroidism   . LBBB (left bundle branch block)   . Permanent atrial fibrillation    a. CHADS2VSC => 4 (CHF, HTN, age x 1, vascular disease); b. on Eliquis  . Pneumonia 2016  . Tobacco abuse     Past Surgical History:  Procedure Laterality Date  . HEMORRHOID SURGERY    . SHOULDER SURGERY     right shoulder   . TONSILLECTOMY      Current Meds  Medication Sig  . albuterol (PROVENTIL HFA;VENTOLIN HFA) 108 (90 Base) MCG/ACT inhaler Inhale 1-2 puffs into the lungs every 4 (four) hours as needed.   Marland Kitchen. apixaban (ELIQUIS) 5 MG TABS tablet Take 1 tablet (5 mg total) by mouth 2 (two) times daily.  . clobetasol cream (TEMOVATE) 0.05 % Apply 1 application topically 2 (two) times daily as needed. Use the least amount possible.  . cyclobenzaprine (FLEXERIL) 10 MG tablet Take 0.5-1 tablets (5-10 mg total) by mouth 3 (three) times daily as needed for muscle spasms. Sedation caution  . ENTRESTO 24-26 MG TAKE 1 TABLET BY MOUTH TWICE DAILY  . furosemide (LASIX) 20 MG tablet Take 1 tablet (20 mg total) by mouth daily as needed.  . hydrOXYzine (ATARAX/VISTARIL) 10 MG tablet TAKE 1/2 TO 1 (ONE-HALF TO ONE) TABLET BY MOUTH EVERY 8 HOURS AS NEEDED FOR ITCHING  . levothyroxine (SYNTHROID, LEVOTHROID) 50 MCG tablet TAKE 1 TABLET BY MOUTH ONCE DAILY  . loratadine (CLARITIN) 10 MG tablet Take 1 tablet (10 mg total) by mouth daily.  .  metoprolol succinate (TOPROL XL) 25 MG 24 hr tablet Take 1 tablet (25 mg total) by mouth daily.  . potassium chloride SA (KLOR-CON M20) 20 MEQ tablet Take 0.5 tablets (10 mEq total) by mouth daily as needed (with lasix).  . predniSONE (DELTASONE) 10 MG tablet TAKE 2 TABLETS BY MOUTH ONCE DAILY AS NEEDED FOR GOUT.  . rosuvastatin (CRESTOR) 10 MG tablet Take 1 tablet (10 mg total) by mouth daily.  Marland Kitchen. tiotropium (SPIRIVA HANDIHALER) 18 MCG inhalation capsule Place 1 capsule (18 mcg total) into inhaler and inhale daily.  . traMADol (ULTRAM) 50 MG tablet TAKE 2 TABLETS BY MOUTH EVERY 12 HOURS AS NEEDED FOR  KNEE  PAIN  . triamcinolone cream (KENALOG) 0.1 %  APPLY TOPICALLY DAILY AS NEEDED    Allergies:   Codeine and Indocin [indomethacin]   Social History:  The patient  reports that he quit smoking about 3  years ago. His smoking use included cigarettes. He quit after 55.00 years of use. He has never used smokeless tobacco. He reports current alcohol use of about 15.0 standard drinks of alcohol per week. He reports that he does not use drugs.   Family History:  The patient's He was adopted. Family history is unknown by patient.  ROS:   Review of Systems  Constitutional: Positive for malaise/fatigue. Negative for chills, diaphoresis, fever and weight loss.  HENT: Negative for congestion.   Eyes: Negative for discharge and redness.  Respiratory: Negative for cough, hemoptysis, sputum production, shortness of breath and wheezing.   Cardiovascular: Positive for claudication. Negative for chest pain, palpitations, orthopnea, leg swelling and PND.  Gastrointestinal: Negative for abdominal pain, blood in stool, constipation, diarrhea, heartburn, melena, nausea and vomiting.  Genitourinary: Negative for hematuria.  Musculoskeletal: Negative for falls and myalgias.  Skin: Positive for itching and rash.  Neurological: Negative for dizziness, tingling, tremors, sensory change, speech change, focal weakness,  loss of consciousness and weakness.  Endo/Heme/Allergies: Does not bruise/bleed easily.  Psychiatric/Behavioral: Negative for substance abuse. The patient is not nervous/anxious.   All other systems reviewed and are negative.    PHYSICAL EXAM:  VS:  BP 112/72 (BP Location: Left Arm, Patient Position: Sitting, Cuff Size: Normal)   Pulse (!) 58   Ht 5\' 10"  (1.778 m)   Wt 174 lb 4 oz (79 kg)   BMI 25.00 kg/m  BMI: Body mass index is 25 kg/m.  Physical Exam  Constitutional: He is oriented to person, place, and time. He appears well-developed and well-nourished.  HENT:  Head: Normocephalic and atraumatic.  Eyes: Right eye exhibits no discharge. Left eye exhibits no discharge.  Neck: Normal range of motion. No JVD present.  Cardiovascular: Normal rate, S1 normal and S2 normal. An irregularly irregular rhythm present. Exam reveals no distant heart sounds, no friction rub, no midsystolic click and no opening snap.  Murmur heard. High-pitched blowing holosystolic murmur is present with a grade of 1/6 at the apex. Pulses:      Posterior tibial pulses are 1+ on the right side and 1+ on the left side.  Pulmonary/Chest: Effort normal and breath sounds normal. No respiratory distress. He has no decreased breath sounds. He has no wheezes. He has no rales. He exhibits no tenderness.  Abdominal: Soft. He exhibits no distension. There is no abdominal tenderness.  Musculoskeletal:        General: No edema.  Neurological: He is alert and oriented to person, place, and time.  Skin: Skin is warm and dry. Rash noted. No cyanosis. Nails show no clubbing.  Mildly erythematous rash noted along the medial aspect of the bilateral wrists, as well as the bilateral and posterior neck.  No obvious indication of secondary infection.  Psychiatric: He has a normal mood and affect. His speech is normal and behavior is normal. Judgment and thought content normal.     EKG:  Was ordered and interpreted by me today.  Shows A. fib, 58 bpm, LBBB  Recent Labs: 11/05/2017: ALT 14; BUN 9; Creatinine, Ser 1.09; Potassium 4.0; Sodium 134; TSH 3.22 01/29/2018: Hemoglobin 10.1; Platelets 141.0  11/05/2017: Cholesterol 148; HDL 77.40; LDL Cholesterol 59; Total CHOL/HDL Ratio 2; Triglycerides 55.0; VLDL 11.0   CrCl cannot be calculated (Patient's most recent lab result is older than the maximum 21 days allowed.).   Wt Readings from Last 3 Encounters:  06/10/18 174 lb 4 oz (79 kg)  05/29/18 174 lb (78.9 kg)  04/29/18  175 lb 8 oz (79.6 kg)     Other studies reviewed: Additional studies/records reviewed today include: summarized above  ASSESSMENT AND PLAN:  1. Permanent A. fib/flutter: Ventricular rates are well controlled.  Continue Toprol-XL for rate control.  Continue Eliquis 5 mg twice daily.  Check CBC given recent anemia with recommendation to proceed with colonoscopy, with patient canceling this procedure.  2. HFrEF secondary to nonischemic cardiomyopathy: He does not appear grossly volume overloaded at this time.  His weight is down another pound from his last office visit which was down 8 pounds from his prior at that time.  This is unintentional weight loss.  I have advised him to follow-up with his GI doctor given recommendation for colonoscopy with anemia and unintentional weight loss.  He Devin otherwise remain on Toprol-XL and Entresto.  Due to relative hypotension we are unable to escalate these medications or add spironolactone at this time.  Recent echocardiogram demonstrated low, though stable EF as outlined above.  He recently had a nonischemic Myoview.  CHF education provided.  3. Mild claudication: Has been evaluated by Dr. Kirke Corin with continued medical management at this time.  4. Aortic atherosclerosis/coronary artery calcification: No symptoms suggestive of angina.  Recent nonischemic Myoview as outlined above.  LDL at goal as outlined below.  Remains on Crestor.  Continue risk factor  modification.  5. COPD: Stable.  Has been referred to pulmonology.  6. Hypertension: Blood pressure is well controlled today.  Remains on Toprol, Entresto, and as needed Lasix.  7. Hyperlipidemia: LDL of 59 from 10/2017.  Remains on Crestor.  8. Rash: This is unchanged and has been present for at least 3 years.  He has been referred to an allergist.  Disposition: F/u with Dr. [name] or an APP in 3 months.  Current medicines are reviewed at length with the patient today.  The patient did not have any concerns regarding medicines.  Elinor Dodge PA-C 06/10/2018 10:25 AM     [facility] [place] [address] [place], [place] [address] [phone]

## 2018-06-10 ENCOUNTER — Encounter: Payer: Self-pay | Admitting: Physician Assistant

## 2018-06-10 ENCOUNTER — Ambulatory Visit (INDEPENDENT_AMBULATORY_CARE_PROVIDER_SITE_OTHER): Payer: Medicare Other | Admitting: Physician Assistant

## 2018-06-10 VITALS — BP 112/72 | HR 58 | Ht 70.0 in | Wt 174.2 lb

## 2018-06-10 DIAGNOSIS — I700 Atherosclerosis of aorta: Secondary | ICD-10-CM

## 2018-06-10 DIAGNOSIS — E785 Hyperlipidemia, unspecified: Secondary | ICD-10-CM

## 2018-06-10 DIAGNOSIS — I420 Dilated cardiomyopathy: Secondary | ICD-10-CM | POA: Diagnosis not present

## 2018-06-10 DIAGNOSIS — I739 Peripheral vascular disease, unspecified: Secondary | ICD-10-CM | POA: Diagnosis not present

## 2018-06-10 DIAGNOSIS — I4821 Permanent atrial fibrillation: Secondary | ICD-10-CM | POA: Diagnosis not present

## 2018-06-10 DIAGNOSIS — I5022 Chronic systolic (congestive) heart failure: Secondary | ICD-10-CM

## 2018-06-10 DIAGNOSIS — I428 Other cardiomyopathies: Secondary | ICD-10-CM

## 2018-06-10 DIAGNOSIS — I2510 Atherosclerotic heart disease of native coronary artery without angina pectoris: Secondary | ICD-10-CM

## 2018-06-10 DIAGNOSIS — I2584 Coronary atherosclerosis due to calcified coronary lesion: Secondary | ICD-10-CM

## 2018-06-10 DIAGNOSIS — R21 Rash and other nonspecific skin eruption: Secondary | ICD-10-CM

## 2018-06-10 DIAGNOSIS — J449 Chronic obstructive pulmonary disease, unspecified: Secondary | ICD-10-CM

## 2018-06-10 DIAGNOSIS — I10 Essential (primary) hypertension: Secondary | ICD-10-CM

## 2018-06-10 LAB — NM MYOCAR MULTI W/SPECT W/WALL MOTION / EF
CHL CUP NUCLEAR SDS: 2
Estimated workload: 1 METS
Exercise duration (min): 0 min
Exercise duration (sec): 0 s
LV dias vol: 108 mL (ref 62–150)
LV sys vol: 51 mL
MPHR: 147 {beats}/min
Peak HR: 85 {beats}/min
Percent HR: 57 %
Rest HR: 68 {beats}/min
SRS: 18
SSS: 19
TID: 0.94

## 2018-06-10 MED ORDER — SACUBITRIL-VALSARTAN 24-26 MG PO TABS: 1.0000 | ORAL_TABLET | Freq: Two times a day (BID) | ORAL | 3 refills | Status: DC

## 2018-06-10 NOTE — Patient Instructions (Addendum)
Medication Instructions:  No changes to current medications.  If you need a refill on your cardiac medications before your next appointment, please call your pharmacy.   Lab work: Labs done and we will call you with results If you have labs (blood work) drawn today and your tests are completely normal, you will receive your results only by: Marland Kitchen MyChart Message (if you have MyChart) OR . A paper copy in the mail If you have any lab test that is abnormal or we need to change your treatment, we will call you to review the results.  Testing/Procedures: None at this time.   Follow-Up: At [facility], you and your health needs are our priority.  As part of our continuing mission to provide you with exceptional heart care, we have created designated Provider Care Teams.  These Care Teams include your primary Cardiologist (physician) and Advanced Practice Providers (APPs -  Physician Assistants and Nurse Practitioners) who all work together to provide you with the care you need, when you need it. You will need a follow up appointment in 3 months.  Please call our office 2 months in advance to schedule this appointment.  You may see Dr. Julien Nordmann or one of the following Advanced Practice Providers on your designated Care Team:   Nicolasa Ducking, NP Eula Listen, PA-C . Marisue Ivan, PA-C  You have been referred to Allergist for further evaluation.   Any Other Special Instructions Will Be Listed Below (If Applicable).

## 2018-06-11 LAB — CBC WITH DIFFERENTIAL/PLATELET
Basophils Absolute: 0.1 10*3/uL (ref 0.0–0.2)
Basos: 1 %
EOS (ABSOLUTE): 0.3 10*3/uL (ref 0.0–0.4)
Eos: 5 %
Hematocrit: 32.3 % — ABNORMAL LOW (ref 37.5–51.0)
Hemoglobin: 11.4 g/dL — ABNORMAL LOW (ref 13.0–17.7)
Immature Grans (Abs): 0 10*3/uL (ref 0.0–0.1)
Immature Granulocytes: 0 %
Lymphocytes Absolute: 1.9 10*3/uL (ref 0.7–3.1)
Lymphs: 33 %
MCH: 33.5 pg — ABNORMAL HIGH (ref 26.6–33.0)
MCHC: 35.3 g/dL (ref 31.5–35.7)
MCV: 95 fL (ref 79–97)
Monocytes Absolute: 0.4 10*3/uL (ref 0.1–0.9)
Monocytes: 7 %
Neutrophils Absolute: 3 10*3/uL (ref 1.4–7.0)
Neutrophils: 54 %
Platelets: 197 10*3/uL (ref 150–450)
RBC: 3.4 x10E6/uL — ABNORMAL LOW (ref 4.14–5.80)
RDW: 11.8 % (ref 11.6–15.4)
WBC: 5.6 10*3/uL (ref 3.4–10.8)

## 2018-06-11 LAB — BASIC METABOLIC PANEL
BUN/Creatinine Ratio: 5 — ABNORMAL LOW (ref 10–24)
BUN: 5 mg/dL — ABNORMAL LOW (ref 8–27)
CO2: 23 mmol/L (ref 20–29)
Calcium: 8.8 mg/dL (ref 8.6–10.2)
Chloride: 92 mmol/L — ABNORMAL LOW (ref 96–106)
Creatinine, Ser: 0.93 mg/dL (ref 0.76–1.27)
GFR calc non Af Amer: 81 mL/min/{1.73_m2} (ref >59)
GFR, EST AFRICAN AMERICAN: 94 mL/min/{1.73_m2} (ref >59)
Glucose: 87 mg/dL (ref 65–99)
Potassium: 4.9 mmol/L (ref 3.5–5.2)
SODIUM: 132 mmol/L — AB (ref 134–144)

## 2018-06-14 ENCOUNTER — Telehealth: Payer: Self-pay | Admitting: Family Medicine

## 2018-06-14 DIAGNOSIS — D649 Anemia, unspecified: Secondary | ICD-10-CM

## 2018-06-14 NOTE — Telephone Encounter (Signed)
Please check with patient to see if he has any plan on following up with GI about his anemia.  Thanks.

## 2018-06-15 NOTE — Telephone Encounter (Signed)
Patient says he went for the appointment and all they talked about was a colonoscopy and he does not want to do a colonoscopy.  Patient says he tried to explain to them that he wasn't there for that.  Patient says he is having so many other problems with itching all over and his legs that he can't do anything else right now.  Patient says Dr. [name] and his associates worked on his legs last week.

## 2018-06-16 ENCOUNTER — Other Ambulatory Visit: Payer: Self-pay | Admitting: Family Medicine

## 2018-06-16 ENCOUNTER — Telehealth: Payer: Self-pay | Admitting: *Deleted

## 2018-06-16 MED ORDER — BENZONATATE 200 MG PO CAPS: 200.0000 mg | ORAL_CAPSULE | Freq: Three times a day (TID) | ORAL | 1 refills | Status: DC | PRN

## 2018-06-16 NOTE — Telephone Encounter (Signed)
Would try tessalon and update [place] as needed.  rx sent.  Thanks.

## 2018-06-16 NOTE — Telephone Encounter (Signed)
Patient advised. Appointment scheduled.  

## 2018-06-16 NOTE — Addendum Note (Signed)
Addended by: [name] on: 06/16/2018 02:04 PM   Modules accepted: Orders

## 2018-06-16 NOTE — Telephone Encounter (Signed)
Electronic refill request. Tramadol Last office visit:   11/14/17 Anemia Last Filled:    120 tablet 1 03/09/2018  Please advise.

## 2018-06-16 NOTE — Telephone Encounter (Signed)
Upon calling the patient to schedule a 1 month follow up, patient states he was about to call and ask for some cough medication.  Patient says he has taken a cold, no fever, no ST, no body aches, just a cough that OTC cough meds has not helped.  Please advise.

## 2018-06-16 NOTE — Telephone Encounter (Signed)
Patient advised.

## 2018-06-16 NOTE — Addendum Note (Signed)
Addended by: [name] on: 06/16/2018 06:28 AM   Modules accepted: Orders

## 2018-06-16 NOTE — Telephone Encounter (Signed)
I get his point.  Going through a colonoscopy would not be without risk, given his situation.  I do want to check his blood counts in about 1 month at an office visit if at all possible.  I put in the follow-up order.  Thanks. He does not need to come in fasting.

## 2018-06-17 NOTE — Telephone Encounter (Signed)
Sent. Thanks.   

## 2018-06-19 ENCOUNTER — Ambulatory Visit: Payer: Medicare Other | Admitting: Cardiovascular Disease

## 2018-06-19 NOTE — Progress Notes (Deleted)
Cardiology Office Note   Date:  06/19/2018   ID:  Devin Ramsey, DOB 07-28-1945, MRN [id number]  PCP:  Devin Namuncan, Graham S, MD  Cardiologist:  Devin Ramsey  No chief complaint on file.     History of Present Illness: Devin Ramsey is a 73 y.o. male who was referred by Devin Ramsey for evaluation management of peripheral arterial disease.  He has known history of permanent atrial fibrillation on long-term anticoagulation with Eliquis, chronic systolic heart failure, left bundle branch block, COPD due to prior tobacco use, hypothyroidism, anemia, hypertension and hyperlipidemia. Echocardiogram in November 2016 showed an EF of 35 to 40%, mild to moderate aortic regurgitation, moderate mitral regurgitation and dilated left atrium.  Prior CT showed extensive coronary artery calcification as well as aortic calcifications. The patient was recently referred for preoperative cardiovascular evaluation for heme positive stool and anemia with hemoglobin between 9 and 10.  He has very poor functional capacity.  Lexiscan Myoview was recommended but the patient canceled due to a fall.  The patient complained of bilateral leg pain with walking and thus he underwent vascular studies today.  ABI was moderately reduced at 0.5 on the right and 0.48 on the left.  Duplex showed bilateral SFA occlusion with tibial disease as well. The patient reports prolonged history of ingrown toenail affecting the second left toe.  He has not cut the toenail in a long time.  He reports seeing Dr. Charlsie Ramsey more than 4 years ago for this issue.  The patient declined intervention at that time.  He started having pain in that toe recently with redness that developed recently.  No open ulceration.  He reports very mild bilateral leg pain with walking.  His functional capacity is very poor at baseline.   Past Medical History:  Diagnosis Date  . Anemia   . Bilateral pleural effusion   . Chronic systolic CHF (congestive heart failure) (HCC)     a. TTE 2/16: EF of 35-40%, WMA consistent with BBB, normal sized LA; b. TTE 11/16: F of 35-40%, mild concentric LVH, no RWMA, normal LV diastolic function parameters, mild to moderate AI, moderate MR, mildly to moderately dilated left atrium measuring 42 mm  . COPD (chronic obstructive pulmonary disease) (HCC)   . Essential hypertension   . Gout   . HLD (hyperlipidemia)   . Hypothyroidism   . LBBB (left bundle branch block)   . Permanent atrial fibrillation    a. CHADS2VSC => 4 (CHF, HTN, age x 1, vascular disease); b. on Eliquis  . Pneumonia 2016  . Tobacco abuse     Past Surgical History:  Procedure Laterality Date  . HEMORRHOID SURGERY    . SHOULDER SURGERY     right shoulder   . TONSILLECTOMY       Current Outpatient Medications  Medication Sig Dispense Refill  . albuterol (PROVENTIL HFA;VENTOLIN HFA) 108 (90 Base) MCG/ACT inhaler Inhale 1-2 puffs into the lungs every 4 (four) hours as needed.     Marland Kitchen. apixaban (ELIQUIS) 5 MG TABS tablet Take 1 tablet (5 mg total) by mouth 2 (two) times daily. 180 tablet 3  . benzonatate (TESSALON) 200 MG capsule Take 1 capsule (200 mg total) by mouth 3 (three) times daily as needed. 30 capsule 1  . clobetasol cream (TEMOVATE) 0.05 % Apply 1 application topically 2 (two) times daily as needed. Use the least amount possible. 45 g 2  . cyclobenzaprine (FLEXERIL) 10 MG tablet Take 0.5-1 tablets (5-10 mg total)  by mouth 3 (three) times daily as needed for muscle spasms. Sedation caution 30 tablet 1  . furosemide (LASIX) 20 MG tablet Take 1 tablet (20 mg total) by mouth daily as needed. 90 tablet 3  . hydrOXYzine (ATARAX/VISTARIL) 10 MG tablet TAKE 1/2 TO 1 (ONE-HALF TO ONE) TABLET BY MOUTH EVERY 8 HOURS AS NEEDED FOR ITCHING 30 tablet 0  . levothyroxine (SYNTHROID, LEVOTHROID) 50 MCG tablet TAKE 1 TABLET BY MOUTH ONCE DAILY 90 tablet 1  . loratadine (CLARITIN) 10 MG tablet Take 1 tablet (10 mg total) by mouth daily. 90 tablet 3  . metoprolol  succinate (TOPROL XL) 25 MG 24 hr tablet Take 1 tablet (25 mg total) by mouth daily. 90 tablet 3  . potassium chloride SA (KLOR-CON M20) 20 MEQ tablet Take 0.5 tablets (10 mEq total) by mouth daily as needed (with lasix).    . predniSONE (DELTASONE) 10 MG tablet TAKE 2 TABLETS BY MOUTH ONCE DAILY AS NEEDED FOR GOUT. 20 tablet 0  . rosuvastatin (CRESTOR) 10 MG tablet Take 1 tablet (10 mg total) by mouth daily. 90 tablet 3  . sacubitril-valsartan (ENTRESTO) 24-26 MG Take 1 tablet by mouth 2 (two) times daily. 180 tablet 3  . tiotropium (SPIRIVA HANDIHALER) 18 MCG inhalation capsule Place 1 capsule (18 mcg total) into inhaler and inhale daily. 30 capsule 12  . traMADol (ULTRAM) 50 MG tablet TAKE 2 TABLETS BY MOUTH EVERY 12 HOURS AS NEEDED FOR KNEE PAIN 120 tablet 1  . triamcinolone cream (KENALOG) 0.1 %  APPLY TOPICALLY DAILY AS NEEDED 454 g 2   No current facility-administered medications for this visit.     Allergies:   Codeine and Indocin [indomethacin]    Social History:  The patient  reports that he quit smoking about 3 years ago. His smoking use included cigarettes. He quit after 55.00 years of use. He has never used smokeless tobacco. He reports current alcohol use of about 15.0 standard drinks of alcohol per week. He reports that he does not use drugs.   Family History:  The patient'Ramsey He was adopted. Family history is unknown by patient.    ROS:  Please see the history of present illness.   Otherwise, review of systems are positive for none.   All other systems are reviewed and negative.    PHYSICAL EXAM: VS:  There were no vitals taken for this visit. , BMI There is no height or weight on file to calculate BMI. GEN: Well nourished, well developed, in no acute distress  HEENT: normal  Neck: no JVD, carotid bruits, or masses Cardiac: RRR; no murmurs, rubs, or gallops,no edema  Respiratory:  clear to auscultation bilaterally, normal work of breathing GI: soft, nontender,  nondistended, + BS MS: no deformity or atrophy  Skin: warm and dry, no rash Neuro:  Strength and sensation are intact Psych: euthymic mood, full affect Vascular: Distal pulses are not palpable.  There is an ingrown toenail affecting the second left toe with redness affecting the toe but no open ulceration.   EKG:  EKG is not ordered today.   Recent Labs: 11/05/2017: ALT 14; TSH 3.22 06/10/2018: BUN 5; Creatinine, Ser 0.93; Hemoglobin 11.4; Platelets 197; Potassium 4.9; Sodium 132    Lipid Panel    Component Value Date/Time   CHOL 148 11/05/2017 1144   CHOL 116 07/18/2016 0841   TRIG 55.0 11/05/2017 1144   HDL 77.40 11/05/2017 1144   HDL 41 07/18/2016 0841   CHOLHDL 2 11/05/2017 1144  VLDL 11.0 11/05/2017 1144   LDLCALC 59 11/05/2017 1144   LDLCALC 34 07/18/2016 0841      Wt Readings from Last 3 Encounters:  06/10/18 174 lb 4 oz (79 kg)  05/29/18 174 lb (78.9 kg)  04/29/18 175 lb 8 oz (79.6 kg)       No flowsheet data found.    ASSESSMENT AND PLAN:  1.  Peripheral arterial disease: The patient seems to have minimal claudication likely due to his poor functional capacity.  His biggest issue seems to be an ingrown toenail affecting the left second toe with possible cellulitis in that area.  I elected to start him on Keflex for 5 days.  I am going to refer him to podiatry to help manage this.  The patient currently does not have any open ulceration but if there is any poor healing or plans for surgery on his toe, he will require an angiogram.  I will have him follow-up with me in few weeks.  2.  Permanent atrial fibrillation: Ventricular rate is controlled and he is on long-term anticoagulation with Eliquis.   3.  Chronic systolic heart failure: He appears to be euvolemic on optimal heart failure medications.  4.  Coronary artery disease: He likely has underlying coronary artery disease and he is scheduled for a Lexiscan Myoview on Monday.  5.  Essential hypertension:  Blood pressure is controlled.  6.  Hyperlipidemia: Currently on rosuvastatin.  Recommend a target LDL of less than 70.    Disposition:   FU with me in 3 weeks  Signed,  Lorine Bears, MD  06/19/2018 11:37 AM     [facility]

## 2018-06-21 ENCOUNTER — Other Ambulatory Visit: Payer: Self-pay | Admitting: Family Medicine

## 2018-06-22 NOTE — Telephone Encounter (Signed)
Electronic refill request Prednisone Last office visit 11/14/17 Last refill 02/03/18 #20

## 2018-06-23 NOTE — Telephone Encounter (Signed)
Sent. If needed frequently or having recurrent symptoms then let me know.  Thanks.

## 2018-07-06 ENCOUNTER — Other Ambulatory Visit: Payer: Self-pay | Admitting: Cardiovascular Disease

## 2018-07-06 NOTE — Telephone Encounter (Signed)
Please review for refill, Thanks !  

## 2018-07-16 ENCOUNTER — Encounter: Payer: Self-pay | Admitting: Family Medicine

## 2018-07-16 ENCOUNTER — Ambulatory Visit (INDEPENDENT_AMBULATORY_CARE_PROVIDER_SITE_OTHER): Payer: Medicare Other | Admitting: Family Medicine

## 2018-07-16 VITALS — BP 110/70 | HR 68 | Temp 97.6°F | Ht 68.0 in | Wt 170.5 lb

## 2018-07-16 DIAGNOSIS — D649 Anemia, unspecified: Secondary | ICD-10-CM

## 2018-07-16 DIAGNOSIS — R21 Rash and other nonspecific skin eruption: Secondary | ICD-10-CM

## 2018-07-16 DIAGNOSIS — M25569 Pain in unspecified knee: Secondary | ICD-10-CM

## 2018-07-16 LAB — IBC PANEL
Iron: 108 ug/dL (ref 42–165)
Saturation Ratios: 53.6 % — ABNORMAL HIGH (ref 20.0–50.0)
Transferrin: 144 mg/dL — ABNORMAL LOW (ref 212.0–360.0)

## 2018-07-16 LAB — SEDIMENTATION RATE: Sed Rate: 3 mm/hr (ref 0–20)

## 2018-07-16 LAB — CBC WITH DIFFERENTIAL/PLATELET
Basophils Absolute: 0.1 10*3/uL (ref 0.0–0.1)
Basophils Relative: 1 % (ref 0.0–3.0)
Eosinophils Absolute: 0.4 10*3/uL (ref 0.0–0.7)
Eosinophils Relative: 5.6 % — ABNORMAL HIGH (ref 0.0–5.0)
HCT: 31.4 % — ABNORMAL LOW (ref 39.0–52.0)
Hemoglobin: 10.6 g/dL — ABNORMAL LOW (ref 13.0–17.0)
Lymphocytes Relative: 27.9 % (ref 12.0–46.0)
Lymphs Abs: 1.7 10*3/uL (ref 0.7–4.0)
MCHC: 33.9 g/dL (ref 30.0–36.0)
MCV: 100.5 fl — ABNORMAL HIGH (ref 78.0–100.0)
Monocytes Absolute: 0.5 10*3/uL (ref 0.1–1.0)
Monocytes Relative: 8 % (ref 3.0–12.0)
Neutro Abs: 3.6 10*3/uL (ref 1.4–7.7)
Neutrophils Relative %: 57.5 % (ref 43.0–77.0)
Platelets: 138 10*3/uL — ABNORMAL LOW (ref 150.0–400.0)
RBC: 3.13 Mil/uL — ABNORMAL LOW (ref 4.22–5.81)
RDW: 15.9 % — AB (ref 11.5–15.5)
WBC: 6.3 10*3/uL (ref 4.0–10.5)

## 2018-07-16 MED ORDER — TRAMADOL HCL 50 MG PO TABS: 100.0000 mg | ORAL_TABLET | Freq: Two times a day (BID) | ORAL | 1 refills | Status: DC | PRN

## 2018-07-16 MED ORDER — CEPHALEXIN 500 MG PO CAPS: 500.0000 mg | ORAL_CAPSULE | Freq: Three times a day (TID) | ORAL | 0 refills | Status: DC

## 2018-07-16 NOTE — Patient Instructions (Signed)
Start taking keflex in the meantime.  Go to the lab on the way out.  We'll contact you with your lab report.  Let me know about the rash early next week.  If this isn't getting better, then we may need to set you up with the skin clinic.  Take care.  Glad to see you.

## 2018-07-16 NOTE — Progress Notes (Signed)
L 4th web space with superficial skin tear after working in the yard today.  Cleaned at office visit and covered.  There is no need for suture given the small amount of skin involved.  He is out of temovate.  No recent use.   Anemia.  Still anticoagulated. Due for f/u labs.  No gross blood in stool.  He wanted to hold off on GI evaluation given his other chronic issues.  Still on levothyroxine, compliant.  Prednisone prev used for gout in knee with relief.  Now off med.  He has used tramadol for baseline knee arthritis with relief of pain and no adverse effect.  He needed a refill on that.  Painful rash on the L side of neck, started years ago but got worse about 2 months ago.  He cut his shirts to keep them from rubbing.  He has similar but less pronounced symptoms on the right side of the neck.  No clear trigger.  Meds, vitals, and allergies reviewed.   ROS: Per HPI unless specifically indicated in ROS section   nad ncat Neck supple, no LA, nonblanching B neck rash, L>R, no ulceration.  Not a typical dermatomal distribution of the rash. rrr ctab abd soft, not ttp  Ext well perfused.  Trace bilateral lower extremity edema.

## 2018-07-19 ENCOUNTER — Other Ambulatory Visit: Payer: Self-pay | Admitting: Family Medicine

## 2018-07-19 DIAGNOSIS — D649 Anemia, unspecified: Secondary | ICD-10-CM

## 2018-07-19 NOTE — Assessment & Plan Note (Signed)
Continue tramadol as needed.  No adverse effect on medication previously.

## 2018-07-19 NOTE — Assessment & Plan Note (Signed)
Still anticoagulated.  Recheck labs pending.  See notes on labs.

## 2018-07-19 NOTE — Assessment & Plan Note (Addendum)
Unclear source.  Atypical distribution and duration for shingles.  Not improved previously with prednisone.  This does not look typical for cellulitis but I would treat him as such in the meantime, preemptively.  Check sed rate on labs.  Check CBC.  If he is not improving in the next few days then we may need to set him up with the dermatology clinic.  Discussed.

## 2018-07-21 ENCOUNTER — Telehealth: Payer: Self-pay

## 2018-07-21 NOTE — Telephone Encounter (Signed)
Pt said he went to pick up generic keflex and tramadol on 07/16/18. Pt said the keflex was ready but not the tramadol rx. Pt wanted to know why could not get tramadol rx. I spoke with Mitzi at [facility] [address] and she said pt had gotten 30 day supply of tramadol on 06/17/18 so was too early to fill. Mitzi ran thru today and refill went thru and will be ready for pick up today at 4 PM for $1.30. pt voiced understanding. Nothing further needed.

## 2018-07-22 NOTE — Progress Notes (Incomplete)
Cardiology Office Note   Date:  07/22/2018   ID:  Devin Ramsey 08-22-1945, MRN [id number]  PCP:  [name], MD  Cardiologist:  Dr. [name]   No chief complaint on file.     History of Present Illness: Devin Ramsey is a 73 y.o. male who was referred by Dr. [name] for evaluation management of peripheral arterial disease.  He has known history of permanent atrial fibrillation on long-term anticoagulation with Eliquis, chronic systolic heart failure, left bundle branch block, COPD due to prior tobacco use, hypothyroidism, anemia, hypertension and hyperlipidemia. Echocardiogram in November 2016 showed an EF of 35 to 40%, mild to moderate aortic regurgitation, moderate mitral regurgitation and dilated left atrium.  Prior CT showed extensive coronary artery calcification as well as aortic calcifications. The patient was recently referred for preoperative cardiovascular evaluation for heme positive stool and anemia with hemoglobin between 9 and 10.  He has very poor functional capacity.  Lexiscan Myoview was recommended but the patient canceled due to a fall.  The patient complained of bilateral leg pain with walking and thus he underwent vascular studies today.  ABI was moderately reduced at 0.5 on the right and 0.48 on the left.  Duplex showed bilateral SFA occlusion with tibial disease as well. The patient reports prolonged history of ingrown toenail affecting the second left toe.  He has not cut the toenail in a long time.  He reports seeing Dr. [name] more than 4 years ago for this issue.  The patient declined intervention at that time.  He started having pain in that toe recently with redness that developed recently.  No open ulceration.  He reports very mild bilateral leg pain with walking.  His functional capacity is very poor at baseline.  The patient is *** doing well today. ***  They deny chest pain, SOB, palpitations or any other related symptoms or complaints at this time.      Past Medical History:  Diagnosis Date   Anemia    Bilateral pleural effusion    Chronic systolic CHF (congestive heart failure) (HCC)    a. TTE 2/16: EF of 35-40%, WMA consistent with BBB, normal sized LA; b. TTE 11/16: F of 35-40%, mild concentric LVH, no RWMA, normal LV diastolic function parameters, mild to moderate AI, moderate MR, mildly to moderately dilated left atrium measuring 42 mm   COPD (chronic obstructive pulmonary disease) (HCC)    Essential hypertension    Gout    HLD (hyperlipidemia)    Hypothyroidism    LBBB (left bundle branch block)    Permanent atrial fibrillation    a. CHADS2VSC => 4 (CHF, HTN, age x 1, vascular disease); b. on Eliquis   Pneumonia 2016   Tobacco abuse     Past Surgical History:  Procedure Laterality Date   HEMORRHOID SURGERY     SHOULDER SURGERY     right shoulder    TONSILLECTOMY       Current Outpatient Medications  Medication Sig Dispense Refill   albuterol (PROVENTIL HFA;VENTOLIN HFA) 108 (90 Base) MCG/ACT inhaler Inhale 1-2 puffs into the lungs every 4 (four) hours as needed.      benzonatate (TESSALON) 200 MG capsule Take 1 capsule (200 mg total) by mouth 3 (three) times daily as needed. 30 capsule 1   cephALEXin (KEFLEX) 500 MG capsule Take 1 capsule (500 mg total) by mouth 3 (three) times daily. 21 capsule 0   clobetasol cream (TEMOVATE) 0.05 % Apply 1 application  topically 2 (two) times daily as needed. Use the least amount possible. 45 g 2   cyclobenzaprine (FLEXERIL) 10 MG tablet Take 0.5-1 tablets (5-10 mg total) by mouth 3 (three) times daily as needed for muscle spasms. Sedation caution 30 tablet 1   ELIQUIS 5 MG TABS tablet TAKE 1 TABLET BY MOUTH TWICE DAILY 180 tablet 0   furosemide (LASIX) 20 MG tablet Take 1 tablet (20 mg total) by mouth daily as needed. 90 tablet 3   hydrOXYzine (ATARAX/VISTARIL) 10 MG tablet TAKE 1/2 TO 1 (ONE-HALF TO ONE) TABLET BY MOUTH EVERY 8 HOURS AS NEEDED FOR ITCHING 30  tablet 0   levothyroxine (SYNTHROID, LEVOTHROID) 50 MCG tablet TAKE 1 TABLET BY MOUTH ONCE DAILY 90 tablet 1   loratadine (CLARITIN) 10 MG tablet Take 1 tablet (10 mg total) by mouth daily. 90 tablet 3   metoprolol succinate (TOPROL XL) 25 MG 24 hr tablet Take 1 tablet (25 mg total) by mouth daily. 90 tablet 3   potassium chloride SA (KLOR-CON M20) 20 MEQ tablet Take 0.5 tablets (10 mEq total) by mouth daily as needed (with lasix).     predniSONE (DELTASONE) 10 MG tablet TAKE 2 TABLETS BY MOUTH ONCE DAILY AS NEEDED FOR GOUT 20 tablet 0   rosuvastatin (CRESTOR) 10 MG tablet Take 1 tablet (10 mg total) by mouth daily. 90 tablet 3   sacubitril-valsartan (ENTRESTO) 24-26 MG Take 1 tablet by mouth 2 (two) times daily. 180 tablet 3   tiotropium (SPIRIVA HANDIHALER) 18 MCG inhalation capsule Place 1 capsule (18 mcg total) into inhaler and inhale daily. 30 capsule 12   traMADol (ULTRAM) 50 MG tablet Take 2 tablets (100 mg total) by mouth every 12 (twelve) hours as needed. 120 tablet 1   triamcinolone cream (KENALOG) 0.1 %  APPLY TOPICALLY DAILY AS NEEDED 454 g 2   No current facility-administered medications for this visit.     Allergies:   Codeine and Indocin [indomethacin]    Social History:  The patient  reports that he quit smoking about 4 years ago. His smoking use included cigarettes. He quit after 55.00 years of use. He has never used smokeless tobacco. He reports current alcohol use of about 15.0 standard drinks of alcohol per week. He reports that he does not use drugs.   Family History:  The patient's He was adopted. Family history is unknown by patient.    ROS:  Please see the history of present illness.   Otherwise, review of systems are positive for none.   All other systems are reviewed and negative.    PHYSICAL EXAM: VS:  There were no vitals taken for this visit. , BMI There is no height or weight on file to calculate BMI. GEN: Well nourished, well developed, in no acute  distress  HEENT: normal  Neck: no JVD, carotid bruits, or masses Cardiac: ***RRR; no murmurs, rubs, or gallops,no edema  Respiratory:  clear to auscultation bilaterally, normal work of breathing GI: soft, nontender, nondistended, + BS MS: no deformity or atrophy  Skin: warm and dry, no rash Neuro:  Strength and sensation are intact Psych: euthymic mood, full affect Vascular: Distal pulses are not palpable.  There is an ingrown toenail affecting the second left toe with redness affecting the toe but no open ulceration.   EKG:  EKG *** ordered today. The ekg ordered today demonstrates ***   Recent Labs: 11/05/2017: ALT 14; TSH 3.22 06/10/2018: BUN 5; Creatinine, Ser 0.93; Potassium 4.9; Sodium 132 07/16/2018: Hemoglobin  10.6; Platelets 138.0    Lipid Panel    Component Value Date/Time   CHOL 148 11/05/2017 1144   CHOL 116 07/18/2016 0841   TRIG 55.0 11/05/2017 1144   HDL 77.40 11/05/2017 1144   HDL 41 07/18/2016 0841   CHOLHDL 2 11/05/2017 1144   VLDL 11.0 11/05/2017 1144   LDLCALC 59 11/05/2017 1144   LDLCALC 34 07/18/2016 0841      Wt Readings from Last 3 Encounters:  07/16/18 170 lb 8 oz (77.3 kg)  06/10/18 174 lb 4 oz (79 kg)  05/29/18 174 lb (78.9 kg)       No flowsheet data found.    ASSESSMENT AND PLAN:  1.  Peripheral arterial disease: The patient seems to have minimal claudication likely due to his poor functional capacity.  His biggest issue seems to be an ingrown toenail affecting the left second toe with possible cellulitis in that area.  I elected to start him on Keflex for 5 days.  I am going to refer him to podiatry to help manage this.  The patient currently does not have any open ulceration but if there is any poor healing or plans for surgery on his toe, he will require an angiogram.  I will have him follow-up with me in few weeks.  2.  Permanent atrial fibrillation: Ventricular rate is controlled and he is on long-term anticoagulation with  Eliquis.   3.  Chronic systolic heart failure: He appears to be euvolemic on optimal heart failure medications.  4.  Coronary artery disease: He likely has underlying coronary artery disease and he is scheduled for a Lexiscan Myoview on Monday.  5.  Essential hypertension: Blood pressure is controlled.  6.  Hyperlipidemia: Currently on rosuvastatin.  Recommend a target LDL of less than 70.    Disposition:   FU with *** in *** months  I, Jesus Reyes am acting as a Neurosurgeon for Lorine Bears, M.D.  {Add scribe attestation statement}   Signed, Lorine Bears, MD 07/22/18 [facility] [place], [place] [phone]

## 2018-07-23 ENCOUNTER — Other Ambulatory Visit: Payer: Self-pay | Admitting: Family Medicine

## 2018-07-23 ENCOUNTER — Other Ambulatory Visit: Payer: Medicare Other

## 2018-07-24 ENCOUNTER — Ambulatory Visit: Payer: Medicare Other | Admitting: Cardiovascular Disease

## 2018-07-24 NOTE — Telephone Encounter (Signed)
Electronic refill request. Hydroxyzine Last office visit:   07/16/2018 Last Filled:    30 tablet 0 05/18/2018  Please advise.

## 2018-07-26 NOTE — Telephone Encounter (Signed)
Sent. Thanks.   

## 2018-07-27 ENCOUNTER — Encounter: Payer: Self-pay | Admitting: Cardiovascular Disease

## 2018-07-28 ENCOUNTER — Telehealth: Payer: Self-pay | Admitting: Family Medicine

## 2018-07-28 NOTE — Telephone Encounter (Signed)
Patient says there is still most of the rash remaining but it is not as red and irritating and it doesn't itch as much.  It is around his neck.  Patient says it is not nearly as painful as before.  Medication is definitely helping but patient wonders if it should be extended for longer.

## 2018-07-28 NOTE — Telephone Encounter (Signed)
Please let me know how much of the rash is left (and where it remains) and how much pain he is having at the rash site.  If the keflex clearly has made a difference but it isn't resolved, then we may need to extend that.  Thanks.

## 2018-07-28 NOTE — Telephone Encounter (Signed)
Pt stated the medication he is using for his spot on his neck is doing better. Please advise pt if needed.

## 2018-07-29 MED ORDER — CEPHALEXIN 500 MG PO CAPS: 500.0000 mg | ORAL_CAPSULE | Freq: Three times a day (TID) | ORAL | 0 refills | Status: DC

## 2018-07-29 NOTE — Telephone Encounter (Signed)
I would extend the keflex rx.  I would like to recheck him in the office when done with this rx, if possible.  Thanks.  rx sent.

## 2018-07-30 NOTE — Telephone Encounter (Signed)
Patient advised.

## 2018-08-08 ENCOUNTER — Other Ambulatory Visit: Payer: Self-pay | Admitting: Cardiovascular Disease

## 2018-08-10 ENCOUNTER — Ambulatory Visit: Payer: Medicare Other | Admitting: Family Medicine

## 2018-08-18 ENCOUNTER — Telehealth: Payer: Self-pay

## 2018-08-18 NOTE — Telephone Encounter (Signed)
Called patient to make him aware that Dr. [name] is limiting pa

## 2018-08-19 NOTE — Telephone Encounter (Signed)
Called Patient 08/18/2018 and Internet went out.  Imputing not in for this patient from Yesterday.   Called patient.  Made him aware that Dr. [name] was limiting patients coming into the office.  Patient denied telephone visit and video visit.  Made patient aware that I will cancel appointment.

## 2018-08-19 NOTE — Telephone Encounter (Signed)
He was stable from a cardiac perspective when I last saw him. He will be a level 3.

## 2018-08-19 NOTE — Telephone Encounter (Signed)
Routed to [facility], [place]  To advise on what level is needed for the pt rescheduled f/u due to COVID-19

## 2018-08-20 NOTE — Telephone Encounter (Signed)
LMOVM to CB

## 2018-09-13 ENCOUNTER — Other Ambulatory Visit: Payer: Self-pay | Admitting: Family Medicine

## 2018-09-14 ENCOUNTER — Ambulatory Visit: Payer: Medicare Other | Admitting: Cardiovascular Disease

## 2018-09-14 NOTE — Telephone Encounter (Signed)
Pharmacy requests refill on: hydroxyzine 10mg   LAST REFILL: #30, on 07/26/18 LAST OV:  07/16/18 rash/ patient cancelled 08/10/18 NEXT OV: 11/16/18 AWV PHARMACY:  [facility] [address]

## 2018-09-15 NOTE — Telephone Encounter (Signed)
Sent. Thanks.   

## 2018-10-29 ENCOUNTER — Other Ambulatory Visit: Payer: Self-pay | Admitting: Family Medicine

## 2018-10-29 NOTE — Telephone Encounter (Signed)
Electronic refill request. Hydroxyzine Last office visit:   07/16/2018 Last Filled:    30 tablet 0 09/15/2018  Please advise.

## 2018-10-30 ENCOUNTER — Other Ambulatory Visit: Payer: Self-pay

## 2018-10-30 NOTE — Telephone Encounter (Signed)
Pharmacy requests refill on: hydroxyzine 10mg   LAST REFILL: #30, on 09/15/2018 LAST OV:  07/16/18 rash/ patient cancelled 08/10/18 NEXT OV: 11/16/18 AWV PHARMACY:  [place]

## 2018-10-30 NOTE — Telephone Encounter (Signed)
Sent. Thanks.   

## 2018-11-08 ENCOUNTER — Other Ambulatory Visit: Payer: Self-pay | Admitting: Family Medicine

## 2018-11-09 NOTE — Telephone Encounter (Signed)
Electronic refill request. Triamcinolone Cream Last office visit:   07/16/2018 Last Filled:    454 g 2 04/14/2018  Please advise.

## 2018-11-10 NOTE — Telephone Encounter (Signed)
Sent. Thanks.   

## 2018-11-12 ENCOUNTER — Ambulatory Visit: Payer: Medicare Other

## 2018-11-12 ENCOUNTER — Other Ambulatory Visit (INDEPENDENT_AMBULATORY_CARE_PROVIDER_SITE_OTHER): Payer: Medicare Other

## 2018-11-12 ENCOUNTER — Other Ambulatory Visit: Payer: Self-pay | Admitting: Family Medicine

## 2018-11-12 DIAGNOSIS — D649 Anemia, unspecified: Secondary | ICD-10-CM

## 2018-11-12 DIAGNOSIS — E039 Hypothyroidism, unspecified: Secondary | ICD-10-CM | POA: Diagnosis not present

## 2018-11-12 DIAGNOSIS — I10 Essential (primary) hypertension: Secondary | ICD-10-CM

## 2018-11-12 LAB — CBC WITH DIFFERENTIAL/PLATELET
Basophils Absolute: 0.1 10*3/uL (ref 0.0–0.1)
Basophils Relative: 2 % (ref 0.0–3.0)
Eosinophils Absolute: 0.4 10*3/uL (ref 0.0–0.7)
Eosinophils Relative: 9.7 % — ABNORMAL HIGH (ref 0.0–5.0)
HCT: 30.4 % — ABNORMAL LOW (ref 39.0–52.0)
Hemoglobin: 10.4 g/dL — ABNORMAL LOW (ref 13.0–17.0)
Lymphocytes Relative: 29.8 % (ref 12.0–46.0)
Lymphs Abs: 1.2 10*3/uL (ref 0.7–4.0)
MCHC: 34.2 g/dL (ref 30.0–36.0)
MCV: 100.7 fl — ABNORMAL HIGH (ref 78.0–100.0)
Monocytes Absolute: 0.3 10*3/uL (ref 0.1–1.0)
Monocytes Relative: 8.3 % (ref 3.0–12.0)
Neutro Abs: 2.1 10*3/uL (ref 1.4–7.7)
Neutrophils Relative %: 50.2 % (ref 43.0–77.0)
Platelets: 142 10*3/uL — ABNORMAL LOW (ref 150.0–400.0)
RBC: 3.02 Mil/uL — ABNORMAL LOW (ref 4.22–5.81)
RDW: 13.3 % (ref 11.5–15.5)
WBC: 4.2 10*3/uL (ref 4.0–10.5)

## 2018-11-12 LAB — COMPREHENSIVE METABOLIC PANEL
ALT: 11 U/L (ref 0–53)
AST: 25 U/L (ref 0–37)
Albumin: 3.2 g/dL — ABNORMAL LOW (ref 3.5–5.2)
Alkaline Phosphatase: 91 U/L (ref 39–117)
BUN: 5 mg/dL — ABNORMAL LOW (ref 6–23)
CO2: 21 mEq/L (ref 19–32)
Calcium: 7.8 mg/dL — ABNORMAL LOW (ref 8.4–10.5)
Chloride: 94 mEq/L — ABNORMAL LOW (ref 96–112)
Creatinine, Ser: 0.93 mg/dL (ref 0.40–1.50)
GFR: 79.43 mL/min (ref >60.00)
Glucose, Bld: 92 mg/dL (ref 70–99)
Potassium: 4.6 mEq/L (ref 3.5–5.1)
Sodium: 123 mEq/L — ABNORMAL LOW (ref 135–145)
Total Bilirubin: 1.1 mg/dL (ref 0.2–1.2)
Total Protein: 5.1 g/dL — ABNORMAL LOW (ref 6.0–8.3)

## 2018-11-12 LAB — LIPID PANEL
Cholesterol: 104 mg/dL (ref 0–200)
HDL: 55.9 mg/dL (ref >39.00)
LDL Cholesterol: 36 mg/dL (ref 0–99)
NonHDL: 47.96
Total CHOL/HDL Ratio: 2
Triglycerides: 62 mg/dL (ref 0.0–149.0)
VLDL: 12.4 mg/dL (ref 0.0–40.0)

## 2018-11-12 LAB — TSH: TSH: 4.8 u[IU]/mL — ABNORMAL HIGH (ref 0.35–4.50)

## 2018-11-12 LAB — IRON: Iron: 210 ug/dL — ABNORMAL HIGH (ref 42–165)

## 2018-11-16 ENCOUNTER — Other Ambulatory Visit: Payer: Self-pay

## 2018-11-16 ENCOUNTER — Encounter: Payer: Self-pay | Admitting: Family Medicine

## 2018-11-16 ENCOUNTER — Ambulatory Visit (INDEPENDENT_AMBULATORY_CARE_PROVIDER_SITE_OTHER): Payer: Medicare Other | Admitting: Family Medicine

## 2018-11-16 VITALS — BP 122/82 | HR 81 | Temp 97.8°F | Ht 68.0 in | Wt 168.6 lb

## 2018-11-16 DIAGNOSIS — E039 Hypothyroidism, unspecified: Secondary | ICD-10-CM | POA: Diagnosis not present

## 2018-11-16 DIAGNOSIS — R0602 Shortness of breath: Secondary | ICD-10-CM

## 2018-11-16 DIAGNOSIS — Z7189 Other specified counseling: Secondary | ICD-10-CM

## 2018-11-16 DIAGNOSIS — D649 Anemia, unspecified: Secondary | ICD-10-CM | POA: Diagnosis not present

## 2018-11-16 DIAGNOSIS — R21 Rash and other nonspecific skin eruption: Secondary | ICD-10-CM | POA: Diagnosis not present

## 2018-11-16 DIAGNOSIS — I5022 Chronic systolic (congestive) heart failure: Secondary | ICD-10-CM | POA: Diagnosis not present

## 2018-11-16 DIAGNOSIS — E782 Mixed hyperlipidemia: Secondary | ICD-10-CM

## 2018-11-16 DIAGNOSIS — J449 Chronic obstructive pulmonary disease, unspecified: Secondary | ICD-10-CM

## 2018-11-16 LAB — BASIC METABOLIC PANEL
BUN: 6 mg/dL (ref 6–23)
CO2: 22 mEq/L (ref 19–32)
Calcium: 8.1 mg/dL — ABNORMAL LOW (ref 8.4–10.5)
Chloride: 95 mEq/L — ABNORMAL LOW (ref 96–112)
Creatinine, Ser: 1.07 mg/dL (ref 0.40–1.50)
GFR: 67.56 mL/min (ref >60.00)
Glucose, Bld: 89 mg/dL (ref 70–99)
Potassium: 4.9 mEq/L (ref 3.5–5.1)
Sodium: 125 mEq/L — ABNORMAL LOW (ref 135–145)

## 2018-11-16 LAB — BRAIN NATRIURETIC PEPTIDE: Pro B Natriuretic peptide (BNP): 901 pg/mL — ABNORMAL HIGH (ref 0.0–100.0)

## 2018-11-16 LAB — IRON: Iron: 202 ug/dL — ABNORMAL HIGH (ref 42–165)

## 2018-11-16 LAB — FERRITIN: Ferritin: 423 ng/mL — ABNORMAL HIGH (ref 22.0–322.0)

## 2018-11-16 MED ORDER — METOPROLOL SUCCINATE ER 25 MG PO TB24: 25.0000 mg | ORAL_TABLET | Freq: Every day | ORAL | 3 refills | Status: DC

## 2018-11-16 MED ORDER — LEVOTHYROXINE SODIUM 50 MCG PO TABS: 50.0000 ug | ORAL_TABLET | Freq: Every day | ORAL | 1 refills | Status: DC

## 2018-11-16 NOTE — Patient Instructions (Addendum)
Don't change your meds for now.  Go to the lab on the way out.  We'll contact you with your lab report. We need to recheck your TSH in about 6 months.   Take care.  Glad to see you.

## 2018-11-16 NOTE — Progress Notes (Signed)
Iron up, hgb similar to prev.  Still on anticoagulation.  No bleeding.  No black stools.  No nosebleeds, no other bleeding.  He had declined colonoscopy prev given other health considerations.  This is reasonable.  TSH mildly up.  Compliant.  No neck mass, no dysphagia.  Labs discussed with patient.  Skin rash, improved some from prev.  Mildly itchy, improved from prev.  Still a little sensitive compared to other skin locally.  Near L side of neck.  Still without clearly identified trigger.  Elevated Cholesterol: Using medications without problems: yes Muscle aches: no Diet compliance: yes Exercise: he can walk for about 100 yards max, usually less, this is his baseline.    CHF.  Compliant with meds.  No ADE on meds.  SOB with exertion, ie walking >100 feet.  low Na noted.  Uses K with lasix, but needs them rarely.  Weight has been stable.    COPD.  He clearly feels better with spiriva.  Overall rare use of SABA, as it didn't seem to help much.  No sputum.  He quit smoking.    Pandemic considerations d/w pt.   [name]. designated if patient were incapacitated.  He is caring for his wife who has memory loss.  His son is helping out.   PMH and SH reviewed  ROS: Per HPI unless specifically indicated in ROS section   Meds, vitals, and allergies reviewed.   GEN: nad, alert and oriented HEENT: mucous membranes moist NECK: supple w/o LA CV: rrr PULM: No increased work of breathing.  He initially had scant exp rhonchi at the bases but those cleared. ABD: soft, +bs EXT: no edema SKIN: well perfused and upon closer inspection it appears that the rash on the left side of the neck may be a blanching hemangioma.  No ulceration.

## 2018-11-18 ENCOUNTER — Other Ambulatory Visit: Payer: Self-pay | Admitting: Family Medicine

## 2018-11-18 DIAGNOSIS — D649 Anemia, unspecified: Secondary | ICD-10-CM

## 2018-11-18 DIAGNOSIS — R0602 Shortness of breath: Secondary | ICD-10-CM

## 2018-11-18 DIAGNOSIS — E039 Hypothyroidism, unspecified: Secondary | ICD-10-CM

## 2018-11-18 NOTE — Assessment & Plan Note (Signed)
TSH mildly up.  Compliant.  No neck mass, no dysphagia.  Labs discussed with patient.  I will be glad to change his thyroid replacement at this point.  We can recheck in 6 months.  He agrees.

## 2018-11-18 NOTE — Assessment & Plan Note (Signed)
This looks more like a chronic flat blanching hemangioma now.  Is not bothering him much and I would leave this alone for now otherwise.  He will update me as needed.

## 2018-11-18 NOTE — Assessment & Plan Note (Signed)
His iron is now elevated.  Hemoglobin still similar to previous.  He still anticoagulated.  He declined colonoscopy given his other health considerations and I think this is reasonable.  No black stools.  No overt bleeding.  Discussed options.  I want a recheck of ferritin and iron level to make sure this is not a false elevation.  See notes on labs.  He agrees to plan.

## 2018-11-18 NOTE — Assessment & Plan Note (Signed)
He clearly feels better with spiriva.  Overall rare use of SABA, as it didn't seem to help much.  No sputum.  He quit smoking.   Continue oxygen.  No change in meds at this point.

## 2018-11-18 NOTE — Assessment & Plan Note (Signed)
Would continue Crestor.  He agrees.

## 2018-11-18 NOTE — Assessment & Plan Note (Addendum)
Low sodium is likely related to CHF.  Recheck labs today.  Continue O2 use at baseline.  Continue Entresto.  No change in meds at this point otherwise.  He agrees.  >25 minutes spent in face to face time with patient, >50% spent in counselling or coordination of care.

## 2018-11-21 ENCOUNTER — Telehealth: Payer: Self-pay | Admitting: Family Medicine

## 2018-11-21 NOTE — Telephone Encounter (Signed)
Please call patient.  I talk with Dr. Fletcher Anon with cardiology about his situation, with his elevated iron level.  It is not a bad idea to talk to hematology in the meantime.  I went ahead and put in referral with the assumption that he would be willing to go.  Let me know if this is not the case.    If he does go see hematology, then he would likely not need follow-up labs here in a month.  Thanks.

## 2018-11-24 ENCOUNTER — Telehealth: Payer: Self-pay | Admitting: Cardiovascular Disease

## 2018-11-24 NOTE — Telephone Encounter (Signed)
-----   Message from Wellington Hampshire, MD sent at 11/20/2018 12:16 PM EDT ----- He might need evaluation for hemochromatosis.  [facility] scheduling: Things that the patient is due for a follow-up with Dr. Rockey Situ.  Please schedule an appointment in the next few weeks.  MA ----- Message ----- From: Tonia Ghent, MD Sent: 11/18/2018   8:02 PM EDT To: Wellington Hampshire, MD, Minna Merritts, MD  FYI about this patient.  His sodium is low and his BNP is up.  He does not look to be clinically decompensated.  He also had elevated iron/ferritin with stable but low hemoglobin.  I am going to make sure that he is not on any extra iron supplement and recheck his labs in 1 month.  I am unsure about why he has an iron elevation at this point.  I appreciate any input that you have.  Thanks.  Brigitte Pulse

## 2018-11-24 NOTE — Telephone Encounter (Signed)
lmov to schedule appt  °

## 2018-11-25 ENCOUNTER — Telehealth: Payer: Self-pay | Admitting: Family Medicine

## 2018-11-25 NOTE — Telephone Encounter (Signed)
Spoke with patient and cancelled the 1 month lab at [facility]. Sent Hematology Referral to the [facility] and gave the patient the information that someone would be calling him to schedule this.

## 2018-11-25 NOTE — Telephone Encounter (Signed)
Patient said he's mentioned to Dr.Duncan that his legs give out.  Patient said he's fallen 10-12 times.  Patient said he has fallen twice since Sunday.  Patient wants to know if he can get a wheelchair. Please call patient.

## 2018-11-29 NOTE — Telephone Encounter (Signed)
Yes, we can get him an order for a wheelchair.  Did he have in mind a regular chair that he would be able to move/use himself?  The bigger question is why is he that much weaker?  How is his breathing? When is he going to see hematology and can we get him back in with cardiology sooner rather than later? Please let me know about the above.  Thanks.

## 2018-11-30 ENCOUNTER — Other Ambulatory Visit: Payer: Self-pay | Admitting: Family Medicine

## 2018-11-30 NOTE — Telephone Encounter (Signed)
Left detailed message on voicemail to return call with answers to the questions.

## 2018-12-01 NOTE — Telephone Encounter (Signed)
Electronic refill request:  Tramadol Last office visit:   11/16/2018 Last Filled:    120 tablet 1 07/16/2018  Please advise.

## 2018-12-01 NOTE — Telephone Encounter (Signed)
Patient says that he will call cardiology to get a follow up appointment scheduled.  Patient also says that the referral coordinator called and said that she had given his information to the secretary at hematology and they would call him to schedule the appointment and he hasn't heard anything.  I reminded him that I had not been able to reach him for 2 days and hematology might have been trying to get him as well.  He states he has a new phone and it doesn't seem to work properly.   Patient says he would like to be able to maneuver himself around because his legs get so weak and gives out.  Patient states his breathing is not the issue.

## 2018-12-01 NOTE — Telephone Encounter (Signed)
Left detailed message on voicemail. (2nd notice)

## 2018-12-02 NOTE — Telephone Encounter (Signed)
Sent. Thanks.   

## 2018-12-04 NOTE — Telephone Encounter (Signed)
I wrote a letter for the wheelchair.  He is he willing to start physical therapy to try to strengthen his legs?  We may be able to get that set up through home health.  Please let me know.  Thanks.

## 2018-12-04 NOTE — Telephone Encounter (Signed)
Scheduled

## 2018-12-07 NOTE — Telephone Encounter (Signed)
Letter mailed to patient at patient's request.  Patient says he has taken PT in the past for about 4 months and it didn't help.  I asked if this was specifically to strengthen his legs and he stated that it was but that his legs just get weak and the PT didn't help.

## 2018-12-08 NOTE — Telephone Encounter (Signed)
In that case I will defer.  Thanks for checking on this.

## 2018-12-09 ENCOUNTER — Inpatient Hospital Stay: Payer: Medicare Other | Admitting: Oncology

## 2018-12-13 ENCOUNTER — Other Ambulatory Visit: Payer: Self-pay | Admitting: Family Medicine

## 2018-12-13 NOTE — Progress Notes (Deleted)
Cardiology Office Note Date:  12/13/2018  Patient ID:  Devin Ramsey, Devin Ramsey 1945-07-28, MRN [id number] PCP:  [name], MD  Cardiologist:  Dr. [name], MD  ***refresh   Chief Complaint: ***  History of Present Illness: Devin Ramsey is a 73 y.o. male with history of permanent Afib/flutter on Eliquis, HFrEF felt to be secondary to NICM by recent stress testing, LBBB, moderate aortic atherosclerosis, COPD secondary to prior tobacco abuse quitting in 2016, hypothyroidism, anemia, HTN, HLD, and gout who presents for follow-up of his A. fib and nonischemic cardiomyopathy.   Based on prior EKGs, it appears his Afib dates back to at least 06/2014. Reported echo from 06/2014 showed an EF of 35-40%, WMA consistent withbundle branch block, normal sized LA. EKG dates from 08/2014, 02/2015, and 03/2015 show sinus rhythm with a 1st degree AV block. Follow up echo in 03/2015 showed no improvement in his EF in sinus rhythm with an EF of 35-40%, mild concentric LVH, no RWMA, normal LV diastolic function parameters, mild to moderate AI, moderate MR, mildly to moderately dilated left atrium measuring 42 mm. Since then, it appears he has been in Afib/flutter.   Prior CT in 01/2015 showed extensive coronary artery calcification, aorta, as well as the orgin of the SMA and renal arteries.   He was seen by his primary cardiologist in the office in 08/2017 and reported stable SOB, no chest pain, and no edema. He was continued on Entresto, Elqiuis, Lipitor, Lasix prn, Toprol as well as his non-cardiac medications.  He was seen in late 2019 by GI for heme positive stool with associated anemia with hemoglobin around 9-10 dating back to mid 2019.  Colonoscopy was recommended.  Given his cardiomyopathy, he was evaluated by cardiology and underwent an echo on 05/29/2018 that showed a low, though stable EF of 35-40%, mild concentric LVH, mild AI, moderate MR, moderately dilated LA. Follow up Myoview on 06/03/2018 showed no  significant ischemia with a small region of fixed defect in the apical and apical anteroseptal region consistent with prior MI, EF 53%, low risk scan. He underwent lower extremity arterial studies on 05/29/2018 that showed a moderately reduced ABI at 0.5 on the right and 0.48 on the left. Duplex showed bilateral SFA occlusion with tibial disease. He was seen that day by Dr. Kirke Corin in vascular consult and reported a long history of an ingrown toenail that was affecting his second left toe. He was felt to have minimal claudication likely due to poor functional capacity. There was no ulceration noted of his lower extremities. With regards to his ingrown toenail, he was started on Keflex and referred to podiatry.  The patient did not follow through with his colonoscopy.  He was last seen in the office in 05/2018 and doing well from a cardiac perspective.  His main complaint was a rash that had been present for the past 3 years.  His shortness of breath was stable.  His weight was noted to be down approximately 9 pounds when compared to past couple visits with recommendation to follow-up with PCP and GI as this weight was unintentional.  He was continued on current medications without escalation of evidence-based heart failure therapy secondary to relative hypotension.  His claudication symptoms were stable.  ***  Labs: 10/2018- BNP 901, ferritin 423, iron 202, sodium 125, potassium 4.9, serum creatinine 1.07, albumin 3.2, AST/ALT normal, LDL 36, TSH elevated 4.8, WBC 4.2, Hgb 10.4, PLT 142     Past Medical History:  Diagnosis Date  . Anemia   . Bilateral pleural effusion   . Chronic systolic CHF (congestive heart failure) ([place])    a. TTE 2/16: EF of 35-40%, WMA consistent with BBB, normal sized LA; b. TTE 11/16: F of 35-40%, mild concentric LVH, no RWMA, normal LV diastolic function parameters, mild to moderate AI, moderate MR, mildly to moderately dilated left atrium measuring 42 mm  . COPD (chronic  obstructive pulmonary disease) ([place] [facility])   . Essential hypertension   . Gout   . HLD (hyperlipidemia)   . Hypothyroidism   . LBBB (left bundle branch block)   . Permanent atrial fibrillation    a. CHADS2VSC => 4 (CHF, HTN, age x 1, vascular disease); b. on Eliquis  . Pneumonia 2016  . Tobacco abuse     Past Surgical History:  Procedure Laterality Date  . HEMORRHOID SURGERY    . SHOULDER SURGERY     right shoulder   . TONSILLECTOMY      No outpatient medications have been marked as taking for the 12/15/18 encounter (Appointment) with Rise Mu, PA-C.    Allergies:   Codeine and Indocin [indomethacin]   Social History:  The patient  reports that he quit smoking about 4 years ago. His smoking use included cigarettes. He quit after 55.00 years of use. He has never used smokeless tobacco. He reports current alcohol use of about 15.0 standard drinks of alcohol per week. He reports that he does not use drugs.   Family History:  The patient's He was adopted. Family history is unknown by patient.  ROS:   ROS   PHYSICAL EXAM: *** VS:  There were no vitals taken for this visit. BMI: There is no height or weight on file to calculate BMI.  Physical Exam   EKG:  Was ordered and interpreted by me today. Shows ***  Recent Labs: 11/12/2018: ALT 11; Hemoglobin 10.4; Platelets 142.0; TSH 4.80 11/16/2018: BUN 6; Creatinine, Ser 1.07; Potassium 4.9; Pro B Natriuretic peptide (BNP) 901.0; Sodium 125  11/12/2018: Cholesterol 104; HDL 55.90; LDL Cholesterol 36; Total CHOL/HDL Ratio 2; Triglycerides 62.0; VLDL 12.4   CrCl cannot be calculated (Patient's most recent lab result is older than the maximum 21 days allowed.).   Wt Readings from Last 3 Encounters:  11/16/18 168 lb 9 oz (76.5 kg)  07/16/18 170 lb 8 oz (77.3 kg)  06/10/18 174 lb 4 oz (79 kg)     Other studies reviewed: Additional studies/records reviewed today include: summarized above  ASSESSMENT AND PLAN:  1. ***  Disposition:  F/u with Dr. Rockey Situ or an APP in ***.  Current medicines are reviewed at length with the patient today.  The patient did not have any concerns regarding medicines.  Signed, Christell Faith, PA-C 12/13/2018 9:56 AM     [address] [place], [place] [phone]

## 2018-12-14 ENCOUNTER — Telehealth: Payer: Self-pay | Admitting: Cardiovascular Disease

## 2018-12-14 NOTE — Telephone Encounter (Signed)

## 2018-12-15 ENCOUNTER — Ambulatory Visit: Payer: Medicare Other | Admitting: Physician Assistant

## 2018-12-17 ENCOUNTER — Other Ambulatory Visit: Payer: Medicare Other

## 2018-12-18 ENCOUNTER — Encounter (INDEPENDENT_AMBULATORY_CARE_PROVIDER_SITE_OTHER): Payer: Self-pay

## 2018-12-18 ENCOUNTER — Telehealth: Payer: Self-pay | Admitting: Family Medicine

## 2018-12-18 ENCOUNTER — Other Ambulatory Visit: Payer: Self-pay

## 2018-12-18 ENCOUNTER — Inpatient Hospital Stay: Payer: Medicare Other

## 2018-12-18 ENCOUNTER — Inpatient Hospital Stay: Payer: Medicare Other | Attending: Oncology | Admitting: Oncology

## 2018-12-18 VITALS — BP 104/55 | HR 55 | Temp 98.8°F | Resp 18 | Ht 68.0 in | Wt 163.3 lb

## 2018-12-18 DIAGNOSIS — E860 Dehydration: Secondary | ICD-10-CM

## 2018-12-18 DIAGNOSIS — Z87891 Personal history of nicotine dependence: Secondary | ICD-10-CM | POA: Diagnosis not present

## 2018-12-18 DIAGNOSIS — Z79899 Other long term (current) drug therapy: Secondary | ICD-10-CM

## 2018-12-18 DIAGNOSIS — I9589 Other hypotension: Secondary | ICD-10-CM | POA: Diagnosis not present

## 2018-12-18 DIAGNOSIS — I4821 Permanent atrial fibrillation: Secondary | ICD-10-CM

## 2018-12-18 DIAGNOSIS — R7989 Other specified abnormal findings of blood chemistry: Secondary | ICD-10-CM

## 2018-12-18 DIAGNOSIS — E871 Hypo-osmolality and hyponatremia: Secondary | ICD-10-CM

## 2018-12-18 DIAGNOSIS — Z7901 Long term (current) use of anticoagulants: Secondary | ICD-10-CM

## 2018-12-18 DIAGNOSIS — R740 Nonspecific elevation of levels of transaminase and lactic acid dehydrogenase [LDH]: Secondary | ICD-10-CM | POA: Diagnosis not present

## 2018-12-18 DIAGNOSIS — N179 Acute kidney failure, unspecified: Secondary | ICD-10-CM | POA: Diagnosis not present

## 2018-12-18 DIAGNOSIS — R7401 Elevation of levels of liver transaminase levels: Secondary | ICD-10-CM

## 2018-12-18 LAB — CBC WITH DIFFERENTIAL/PLATELET
Abs Immature Granulocytes: 0.02 10*3/uL (ref 0.00–0.07)
Basophils Absolute: 0.1 10*3/uL (ref 0.0–0.1)
Basophils Relative: 1 %
Eosinophils Absolute: 0.5 10*3/uL (ref 0.0–0.5)
Eosinophils Relative: 7 %
HCT: 32.5 % — ABNORMAL LOW (ref 39.0–52.0)
Hemoglobin: 10.9 g/dL — ABNORMAL LOW (ref 13.0–17.0)
Immature Granulocytes: 0 %
Lymphocytes Relative: 24 %
Lymphs Abs: 1.5 10*3/uL (ref 0.7–4.0)
MCH: 33.7 pg (ref 26.0–34.0)
MCHC: 33.5 g/dL (ref 30.0–36.0)
MCV: 100.6 fL — ABNORMAL HIGH (ref 80.0–100.0)
Monocytes Absolute: 0.5 10*3/uL (ref 0.1–1.0)
Monocytes Relative: 8 %
Neutro Abs: 3.7 10*3/uL (ref 1.7–7.7)
Neutrophils Relative %: 60 %
Platelets: 186 10*3/uL (ref 150–400)
RBC: 3.23 MIL/uL — ABNORMAL LOW (ref 4.22–5.81)
RDW: 13.2 % (ref 11.5–15.5)
WBC: 6.3 10*3/uL (ref 4.0–10.5)
nRBC: 0 % (ref 0.0–0.2)

## 2018-12-18 LAB — OSMOLALITY: Osmolality: 275 mOsm/kg (ref 275–295)

## 2018-12-18 LAB — COMPREHENSIVE METABOLIC PANEL
ALT: 24 U/L (ref 0–44)
AST: 49 U/L — ABNORMAL HIGH (ref 15–41)
Albumin: 3.6 g/dL (ref 3.5–5.0)
Alkaline Phosphatase: 119 U/L (ref 38–126)
Anion gap: 10 (ref 5–15)
BUN: 8 mg/dL (ref 8–23)
CO2: 23 mmol/L (ref 22–32)
Calcium: 8.5 mg/dL — ABNORMAL LOW (ref 8.9–10.3)
Chloride: 98 mmol/L (ref 98–111)
Creatinine, Ser: 1.3 mg/dL — ABNORMAL HIGH (ref 0.61–1.24)
GFR calc Af Amer: >60 mL/min (ref >60)
GFR calc non Af Amer: 54 mL/min — ABNORMAL LOW (ref >60)
Glucose, Bld: 96 mg/dL (ref 70–99)
Potassium: 4.4 mmol/L (ref 3.5–5.1)
Sodium: 131 mmol/L — ABNORMAL LOW (ref 135–145)
Total Bilirubin: 1.5 mg/dL — ABNORMAL HIGH (ref 0.3–1.2)
Total Protein: 6.1 g/dL — ABNORMAL LOW (ref 6.5–8.1)

## 2018-12-18 LAB — FOLATE: Folate: 11.3 ng/mL (ref >5.9)

## 2018-12-18 LAB — IRON AND TIBC
Iron: 173 ug/dL (ref 45–182)
Saturation Ratios: 76 % — ABNORMAL HIGH (ref 17.9–39.5)
TIBC: 227 ug/dL — ABNORMAL LOW (ref 250–450)
UIBC: 54 ug/dL

## 2018-12-18 LAB — FERRITIN: Ferritin: 354 ng/mL — ABNORMAL HIGH (ref 24–336)

## 2018-12-18 LAB — OSMOLALITY, URINE: Osmolality, Ur: 263 mOsm/kg — ABNORMAL LOW (ref 300–900)

## 2018-12-18 LAB — SODIUM, URINE, RANDOM: Sodium, Ur: 33 mmol/L

## 2018-12-18 LAB — VITAMIN B12: Vitamin B-12: 281 pg/mL (ref 180–914)

## 2018-12-18 NOTE — Progress Notes (Signed)
The patient does report that his wife has just broke her hip and he has not been caring for his self like he was suppose to. His blood pressure is low while he is in the office first reading 71/41 hr 68. Second reading 85/57 hr 59

## 2018-12-18 NOTE — Telephone Encounter (Signed)
Please call pt and check on him about this BP on Monday.  Was seen at hematology on 12/18/18.  Thanks.

## 2018-12-19 ENCOUNTER — Encounter: Payer: Self-pay | Admitting: Oncology

## 2018-12-19 LAB — HEPATITIS B SURFACE ANTIGEN: Hepatitis B Surface Ag: NEGATIVE

## 2018-12-19 LAB — HEPATITIS C ANTIBODY: HCV Ab: <0.1 s/co ratio (ref 0.0–0.9)

## 2018-12-19 NOTE — Progress Notes (Signed)
Hematology/Oncology Consult note [facility] [phone]) [phone] [phone]   Patient Care Team: Joaquim Namuncan, Graham S, MD as PCP - General (Family Medicine) Antonieta IbaGollan, Timothy J, MD as Consulting Physician (Cardiology)  REFERRING PROVIDER: Joaquim Namuncan, Graham S, MD  CHIEF COMPLAINTS/REASON FOR VISIT:  Evaluation of iron overload.   HISTORY OF PRESENTING ILLNESS:  Devin Ramsey is a  73 y.o.  male with PMH listed below was seen in consultation at the request of  Joaquim Namuncan, Graham S, MD  for evaluation of iron overload.  Patient had lab work done on 11/16/2018.  Found to have a ferritin level of 423.  07/16/2018 saturation 53.6.  Transferrin 144. Patient had a history of atrial fibrillation, LBBB, chronic systolic CHF, 05/29/2018 2D echo showed LVEF 35-40% History of chronic alcohol use, 2-3 beers a day on average Former smoker, quit in 2016.  History of 55-year pack smoking.  Today in the clinic, he feels little weak.  Did not eat much breakfast in the morning.  Blood pressure was low, first reading 71/41, heart rate 68, second reading 85/57, with heart rate of 59. Denies any dizziness or lightheadedness. Patient is on Toprol XL 25 mg daily, Lasix 20 mg daily,entresto 24-26mg  BID.  He took all his medication the morning.   Review of Systems  Constitutional: Positive for fatigue. Negative for appetite change, chills, fever and unexpected weight change.  HENT:   Negative for hearing loss and voice change.   Eyes: Negative for eye problems and icterus.  Respiratory: Negative for chest tightness, cough and shortness of breath.   Cardiovascular: Negative for chest pain and leg swelling.  Gastrointestinal: Negative for abdominal distention and abdominal pain.  Endocrine: Negative for hot flashes.  Genitourinary: Negative for difficulty urinating, dysuria and frequency.   Musculoskeletal: Negative for arthralgias.  Skin: Negative for itching and rash.  Neurological:  Negative for dizziness, light-headedness and numbness.  Hematological: Negative for adenopathy. Does not bruise/bleed easily.  Psychiatric/Behavioral: Negative for confusion.    MEDICAL HISTORY:  Past Medical History:  Diagnosis Date  . Anemia   . Bilateral pleural effusion   . Chronic systolic CHF (congestive heart failure) ([facility])    a. TTE 2/16: EF of 35-40%, WMA consistent with BBB, normal sized LA; b. TTE 11/16: F of 35-40%, mild concentric LVH, no RWMA, normal LV diastolic function parameters, mild to moderate AI, moderate MR, mildly to moderately dilated left atrium measuring 42 mm  . COPD (chronic obstructive pulmonary disease) ([facility])   . Essential hypertension   . Gout   . HLD (hyperlipidemia)   . Hypothyroidism   . LBBB (left bundle branch block)   . Permanent atrial fibrillation    a. CHADS2VSC => 4 (CHF, HTN, age x 1, vascular disease); b. on Eliquis  . Pneumonia 2016  . Tobacco abuse     SURGICAL HISTORY: Past Surgical History:  Procedure Laterality Date  . HEMORRHOID SURGERY    . SHOULDER SURGERY     right shoulder   . TONSILLECTOMY      SOCIAL HISTORY: Social History   Socioeconomic History  . Marital status: Married    Spouse name: Not on file  . Number of children: Not on file  . Years of education: Not on file  . Highest education level: Not on file  Occupational History  . Not on file  Social Needs  . Financial resource strain: Not on file  . Food insecurity    Worry: Not on file    Inability: Not  on file  . Transportation needs    Medical: Not on file    Non-medical: Not on file  Tobacco Use  . Smoking status: Former Smoker    Years: 55.00    Types: Cigarettes    Quit date: 06/27/2014    Years since quitting: 4.4  . Smokeless tobacco: Never Used  Substance and Sexual Activity  . Alcohol use: Yes    Alcohol/week: 15.0 standard drinks    Types: 15 Cans of beer per week    Comment: 2-3 beers a day average  . Drug use: No  . Sexual activity:  Not Currently  Lifestyle  . Physical activity    Days per week: Not on file    Minutes per session: Not on file  . Stress: Not on file  Relationships  . Social Musicianconnections    Talks on phone: Not on file    Gets together: Not on file    Attends religious service: Not on file    Active member of club or organization: Not on file    Attends meetings of clubs or organizations: Not on file    Relationship status: Not on file  . Intimate partner violence    Fear of current or ex partner: Not on file    Emotionally abused: Not on file    Physically abused: Not on file    Forced sexual activity: Not on file  Other Topics Concern  . Not on file  Social History Narrative   Married 1966   From [place]    [employer] fan   1 son, local    FAMILY HISTORY: Family History  Adopted: Yes  Family history unknown: Yes    ALLERGIES:  is allergic to codeine and indocin [indomethacin].  MEDICATIONS:  Current Outpatient Medications  Medication Sig Dispense Refill  . cyclobenzaprine (FLEXERIL) 10 MG tablet Take 0.5-1 tablets (5-10 mg total) by mouth 3 (three) times daily as needed for muscle spasms. Sedation caution 30 tablet 1  . ELIQUIS 5 MG TABS tablet TAKE 1 TABLET BY MOUTH TWICE DAILY 180 tablet 0  . furosemide (LASIX) 20 MG tablet Take 1 tablet (20 mg total) by mouth daily as needed. 90 tablet 3  . hydrOXYzine (ATARAX/VISTARIL) 10 MG tablet TAKE 1/2 TO 1 (ONE-HALF TO ONE) TABLET BY MOUTH EVERY 8 HOURS AS NEEDED FOR ITCHING 30 tablet 0  . levothyroxine (SYNTHROID) 50 MCG tablet Take 1 tablet (50 mcg total) by mouth daily. 90 tablet 1  . loratadine (CLARITIN) 10 MG tablet Take 1 tablet (10 mg total) by mouth daily. 90 tablet 3  . metoprolol succinate (TOPROL-XL) 25 MG 24 hr tablet Take 1 tablet (25 mg total) by mouth daily. 90 tablet 3  . potassium chloride SA (KLOR-CON M20) 20 MEQ tablet Take 0.5 tablets (10 mEq total) by mouth daily as needed (with lasix).    . rosuvastatin (CRESTOR) 10 MG tablet  Take 1 tablet (10 mg total) by mouth daily. 90 tablet 3  . sacubitril-valsartan (ENTRESTO) 24-26 MG Take 1 tablet by mouth 2 (two) times daily. 180 tablet 3  . tiotropium (SPIRIVA HANDIHALER) 18 MCG inhalation capsule Place 1 capsule (18 mcg total) into inhaler and inhale daily. 30 capsule 12  . traMADol (ULTRAM) 50 MG tablet TAKE 2 TABLETS BY MOUTH EVERY 12 HOURS AS NEEDED FOR  KNEE  PAIN 120 tablet 1  . albuterol (PROVENTIL HFA;VENTOLIN HFA) 108 (90 Base) MCG/ACT inhaler Inhale 1-2 puffs into the lungs every 4 (four) hours as needed.     .Marland Kitchen  clobetasol cream (TEMOVATE) 5.27 % Apply 1 application topically 2 (two) times daily as needed. Use the least amount possible. (Patient not taking: Reported on 12/18/2018) 45 g 2  . triamcinolone cream (KENALOG) 0.1 % APPLY TOPICALLY DAILY AS NEEDED (Patient not taking: Reported on 12/18/2018) 454 g 1   No current facility-administered medications for this visit.      PHYSICAL EXAMINATION: ECOG PERFORMANCE STATUS: 2 - Symptomatic, <50% confined to bed Vitals:   12/18/18 1125 12/18/18 1214  BP: (!) 85/57 (!) 104/55  Pulse: 63 (!) 55  Resp:    Temp:    SpO2:     Filed Weights   12/18/18 1110  Weight: 163 lb 4.8 oz (74.1 kg)    Physical Exam Constitutional:      General: He is not in acute distress.    Appearance: He is ill-appearing.     Comments: Sitting in wheelchair.  HENT:     Head: Normocephalic and atraumatic.  Eyes:     General: No scleral icterus.    Pupils: Pupils are equal, round, and reactive to light.  Neck:     Musculoskeletal: Normal range of motion and neck supple.  Cardiovascular:     Rate and Rhythm: Normal rate and regular rhythm.     Heart sounds: Normal heart sounds.  Pulmonary:     Effort: Pulmonary effort is normal. No respiratory distress.     Breath sounds: No wheezing.     Comments: Decreased breath sounds bilaterally. Abdominal:     General: Bowel sounds are normal. There is no distension.     Palpations:  Abdomen is soft. There is no mass.     Tenderness: There is no abdominal tenderness.  Musculoskeletal: Normal range of motion.        General: No deformity.  Skin:    General: Skin is warm and dry.     Findings: No erythema or rash.  Neurological:     Mental Status: He is alert and oriented to person, place, and time.     Cranial Nerves: No cranial nerve deficit.     Coordination: Coordination normal.  Psychiatric:        Behavior: Behavior normal.        Thought Content: Thought content normal.     LABORATORY DATA:  I have reviewed the data as listed Lab Results  Component Value Date   WBC 6.3 12/18/2018   HGB 10.9 (L) 12/18/2018   HCT 32.5 (L) 12/18/2018   MCV 100.6 (H) 12/18/2018   PLT 186 12/18/2018   Recent Labs    06/10/18 1034 11/12/18 1008 11/16/18 1053 12/18/18 1215  NA 132* 123* 125* 131*  K 4.9 4.6 4.9 4.4  CL 92* 94* 95* 98  CO2 23 21 22 23   GLUCOSE 87 92 89 96  BUN 5* 5* 6 8  CREATININE 0.93 0.93 1.07 1.30*  CALCIUM 8.8 7.8* 8.1* 8.5*  GFRNONAA 81  --   --  54*  GFRAA 94  --   --  >60  PROT  --  5.1*  --  6.1*  ALBUMIN  --  3.2*  --  3.6  AST  --  25  --  49*  ALT  --  11  --  24  ALKPHOS  --  91  --  119  BILITOT  --  1.1  --  1.5*   Iron/TIBC/Ferritin/ %Sat    Component Value Date/Time   IRON 173 12/18/2018 1215   TIBC 227 (L) 12/18/2018  1215   FERRITIN 354 (H) 12/18/2018 1215   IRONPCTSAT 76 (H) 12/18/2018 1215      RADIOGRAPHIC STUDIES: I have personally reviewed the radiological images as listed and agreed with the findings in the report. No results found.   ASSESSMENT & PLAN:  1. Elevated ferritin   2. Hyponatremia   3. Dehydration   4. Transaminitis   5. Other specified hypotension   6. AKI (acute kidney injury) ([facility])    #Labs are reviewed and discussed with patient. Previous iron panel raises suspicious of iron overload. I will repeat a full iron panel including TIBC, iron, saturation ratio, and ferritin. Etiology of  iron overload includes chronic alcohol use, hereditary hemochromatosis, etc.  #Chronic hypo-natremia, probably secondary to chronic alcohol use.  11/16/2018, sodium was 125. Urine osmolarity, serum osmolarity, urine sodium.  #Hypotension/dehydration/elevated creatinine. Low blood pressure secondary to poor oral intake. We advised patient to drink 2 cups of water and remeasure his blood pressure improved to 104/55.  Patient is asymptomatic. Communicated with primary care physician Dr. Para Marchuncan, who will arrange patient to have a blood pressure follow-up next week. Encourage oral hydration at home.  Measure his blood pressure before taking blood pressure medication.  #Transaminitis, increased bilirubin level. Obtain ultrasound liver in the future. Check hepatitis panel.   Orders Placed This Encounter  Procedures  . CBC with Differential/Platelet    Standing Status:   Future    Number of Occurrences:   1    Standing Expiration Date:   12/18/2019  . Comprehensive metabolic panel    Standing Status:   Future    Number of Occurrences:   1    Standing Expiration Date:   12/18/2019  . Hemochromatosis DNA-PCR(c282y,h63d)    Standing Status:   Future    Number of Occurrences:   1    Standing Expiration Date:   12/18/2019  . Iron and TIBC    Standing Status:   Future    Number of Occurrences:   1    Standing Expiration Date:   12/18/2019  . Ferritin    Standing Status:   Future    Number of Occurrences:   1    Standing Expiration Date:   12/18/2019  . Vitamin B12    Standing Status:   Future    Number of Occurrences:   1    Standing Expiration Date:   12/18/2019  . Folate    Standing Status:   Future    Number of Occurrences:   1    Standing Expiration Date:   12/18/2019  . Hepatitis B surface antigen    Standing Status:   Future    Number of Occurrences:   1    Standing Expiration Date:   12/18/2019  . Hepatitis C antibody    Standing Status:   Future    Number of Occurrences:   1     Standing Expiration Date:   12/18/2019  . Osmolality    Standing Status:   Future    Number of Occurrences:   1    Standing Expiration Date:   12/18/2019  . Osmolality, urine    Standing Status:   Future    Number of Occurrences:   1    Standing Expiration Date:   12/18/2019  . Sodium, urine, random    Standing Status:   Future    Number of Occurrences:   1    Standing Expiration Date:   12/18/2019    All questions were  answered. The patient knows to call the clinic with any problems questions or concerns.   [name], MD    Return of visit: 2 weeks to discuss lab results. Thank you for this kind referral and the opportunity to participate in the care of this patient. A copy of today's note is routed to referring provider  Total face to face encounter time for this patient visit was 60 min. >50% of the time was  spent in counseling and coordination of care.    Rickard Patience, MD, PhD Hematology Oncology [facility] 12/19/2018

## 2018-12-21 ENCOUNTER — Other Ambulatory Visit: Payer: Self-pay | Admitting: Cardiovascular Disease

## 2018-12-21 NOTE — Progress Notes (Signed)
Cardiology Office Note    Date:  12/25/2018   ID:  Devin Ramsey, Devin Ramsey 06-10-1945, MRN [id number]  PCP:  Tonia Ghent, MD  Cardiologist:  Ida Rogue, MD  Electrophysiologist:  None   Chief Complaint: Follow-up  History of Present Illness:   Devin Ramsey is a 73 y.o. male with history of permanent Afib/flutter on Eliquis, HFrEF felt to be secondary to NICM by recent stress testing, LBBB, moderate aortic atherosclerosis, COPD secondary to prior tobacco abuse quitting in 2016, hypothyroidism, anemia, HTN, HLD, and gout who presents for follow-up of his A. fib and nonischemic cardiomyopathy.   Based on prior EKGs, it appears his Afib dates back to at least 06/2014. Reported echo from 06/2014 showed an EF of 35-40%, WMA consistent withbundle branch block, normal sized LA. EKG dates from 08/2014, 02/2015, and 03/2015 show sinus rhythm with a 1st degree AV block. Follow up echo in 03/2015 showed no improvement in his EF in sinus rhythm with an EF of 35-40%, mild concentric LVH, no RWMA, normal LV diastolic function parameters, mild to moderate AI, moderate MR, mildly to moderately dilated left atrium measuring 42 mm. Since then, it appears he has been in Afib/flutter.   Prior CT in 01/2015 showed extensive coronary artery calcification, aorta, as well as the orgin of the SMA and renal arteries.   He was seen by his primary cardiologist in the office in 08/2017 and reported stable SOB, no chest pain, and no edema. He was continued on Entresto, Elqiuis, Lipitor, Lasix prn, Toprol as well as his non-cardiac medications.  He was seen in late 2019 by GI for heme positive stool with associated anemia with hemoglobin around 9-10 dating back to mid 2019.  Colonoscopy was recommended.  Given his cardiomyopathy, he was evaluated by cardiology and underwent an echo on 05/29/2018 that showed a low, though stable EF of 35-40%, mild concentric LVH, mild AI, moderate MR, moderately dilated LA. Follow up  Myoview on 06/03/2018 showed no significant ischemia with a small region of fixed defect in the apical and apical anteroseptal region consistent with prior MI, EF 53%, low risk scan. He underwent lower extremity arterial studies on 05/29/2018 that showed a moderately reduced ABI at 0.5 on the right and 0.48 on the left. Duplex showed bilateral SFA occlusion with tibial disease. He was seen that day by Dr. Fletcher Anon in vascular consult and reported a long history of an ingrown toenail that was affecting his second left toe. He was felt to have minimal claudication likely due to poor functional capacity. There was no ulceration noted of his lower extremities. With regards to his ingrown toenail, he was started on Keflex and referred to podiatry.  The patient did not follow through with his colonoscopy.  He was last seen in the office in 05/2018 and doing well from a cardiac perspective.  His main complaint was a rash that had been present for the past 3 years.  His shortness of breath was stable.  His weight was noted to be down approximately 9 pounds when compared to past couple visits with recommendation to follow-up with PCP and GI as this weight was unintentional.  He was continued on current medications without escalation of evidence-based heart failure therapy secondary to relative hypotension.  His claudication symptoms were stable.  Patient comes in today stating he continues to do well from a cardiac perspective.  He denies any chest pain, shortness of breath, palpitations, dizziness, presyncope, or syncope.  He denies any  lower extremity swelling, abdominal distention, orthopnea, PND, early satiety.  No falls since he was last seen.  No BRBPR or melena.  Indicates he has not needed his as needed Lasix in approximately 12 months.  He does continue to drink approximately 5 to 812 ounce beers on a daily basis.  He has not smoked since 2016.  He continues to be uninterested in GI evaluation for previously noted heme  positive stools with associated anemia and unintentional weight loss.  His weight is down another 10 pounds today when compared to his visit in 05/2018.  Labs: 11/2018-WBC 6.3, Hgb 10.9, PLT 186, sodium 131, potassium 4.4, serum creatinine 1.3 with a baseline around 0.9-1.0, AST 49, ALT normal, albumin 3.6 10/2018-LDL 36, TSH elevated 4.8,    Past Medical History:  Diagnosis Date   Anemia    Bilateral pleural effusion    Chronic systolic CHF (congestive heart failure) (HCC)    a. TTE 2/16: EF of 35-40%, WMA consistent with BBB, normal sized LA; b. TTE 11/16: F of 35-40%, mild concentric LVH, no RWMA, normal LV diastolic function parameters, mild to moderate AI, moderate MR, mildly to moderately dilated left atrium measuring 42 mm   COPD (chronic obstructive pulmonary disease) (HCC)    Essential hypertension    Gout    HLD (hyperlipidemia)    Hypothyroidism    LBBB (left bundle branch block)    Permanent atrial fibrillation    a. CHADS2VSC => 4 (CHF, HTN, age x 1, vascular disease); b. on Eliquis   Pneumonia 2016   Tobacco abuse     Past Surgical History:  Procedure Laterality Date   HEMORRHOID SURGERY     SHOULDER SURGERY     right shoulder    TONSILLECTOMY      Current Medications: Current Meds  Medication Sig   albuterol (PROVENTIL HFA;VENTOLIN HFA) 108 (90 Base) MCG/ACT inhaler Inhale 1-2 puffs into the lungs every 4 (four) hours as needed.    clobetasol cream (TEMOVATE) 0.05 % Apply 1 application topically 2 (two) times daily as needed. Use the least amount possible.   cyclobenzaprine (FLEXERIL) 10 MG tablet Take 0.5-1 tablets (5-10 mg total) by mouth 3 (three) times daily as needed for muscle spasms. Sedation caution   ELIQUIS 5 MG TABS tablet Take 1 tablet by mouth twice daily   furosemide (LASIX) 20 MG tablet Take 1 tablet (20 mg total) by mouth daily as needed.   hydrOXYzine (ATARAX/VISTARIL) 10 MG tablet TAKE 1/2 TO 1 (ONE-HALF TO ONE) TABLET BY  MOUTH EVERY 8 HOURS AS NEEDED FOR ITCHING   levothyroxine (SYNTHROID) 50 MCG tablet Take 1 tablet (50 mcg total) by mouth daily.   loratadine (CLARITIN) 10 MG tablet Take 1 tablet (10 mg total) by mouth daily.   metoprolol succinate (TOPROL-XL) 25 MG 24 hr tablet Take 1 tablet (25 mg total) by mouth daily.   potassium chloride SA (KLOR-CON M20) 20 MEQ tablet Take 0.5 tablets (10 mEq total) by mouth daily as needed (with lasix).   sacubitril-valsartan (ENTRESTO) 24-26 MG Take 1 tablet by mouth 2 (two) times daily.   tiotropium (SPIRIVA HANDIHALER) 18 MCG inhalation capsule Place 1 capsule (18 mcg total) into inhaler and inhale daily.   traMADol (ULTRAM) 50 MG tablet TAKE 2 TABLETS BY MOUTH EVERY 12 HOURS AS NEEDED FOR  KNEE  PAIN   triamcinolone cream (KENALOG) 0.1 % APPLY TOPICALLY DAILY AS NEEDED    Allergies:   Codeine and Indocin [indomethacin]   Social History  Socioeconomic History   Marital status: Married    Spouse name: Not on file   Number of children: Not on file   Years of education: Not on file   Highest education level: Not on file  Occupational History   Not on file  Social Needs   Financial resource strain: Not on file   Food insecurity    Worry: Not on file    Inability: Not on file   Transportation needs    Medical: Not on file    Non-medical: Not on file  Tobacco Use   Smoking status: Former Smoker    Years: 55.00    Types: Cigarettes    Quit date: 06/27/2014    Years since quitting: 4.4   Smokeless tobacco: Never Used  Substance and Sexual Activity   Alcohol use: Yes    Alcohol/week: 15.0 standard drinks    Types: 15 Cans of beer per week    Comment: 2-3 beers a day average   Drug use: No   Sexual activity: Not Currently  Lifestyle   Physical activity    Days per week: Not on file    Minutes per session: Not on file   Stress: Not on file  Relationships   Social connections    Talks on phone: Not on file    Gets together:  Not on file    Attends religious service: Not on file    Active member of club or organization: Not on file    Attends meetings of clubs or organizations: Not on file    Relationship status: Not on file  Other Topics Concern   Not on file  Social History Narrative   Married 1966   [place]    [employer] fan   1 son, local     Family History:  The patient's He was adopted. Family history is unknown by patient.  ROS:   Review of Systems  Constitutional: Positive for malaise/fatigue and weight loss. Negative for chills, diaphoresis and fever.  HENT: Negative for congestion.   Eyes: Negative for discharge and redness.  Respiratory: Negative for cough, hemoptysis, sputum production, shortness of breath and wheezing.   Cardiovascular: Negative for chest pain, palpitations, orthopnea, claudication, leg swelling and PND.  Gastrointestinal: Negative for abdominal pain, blood in stool, constipation, diarrhea, heartburn, melena, nausea and vomiting.  Genitourinary: Negative for hematuria.  Musculoskeletal: Negative for falls and myalgias.  Skin: Positive for rash.  Neurological: Negative for dizziness, tingling, tremors, sensory change, speech change, focal weakness, loss of consciousness and weakness.  Endo/Heme/Allergies: Does not bruise/bleed easily.  Psychiatric/Behavioral: Negative for substance abuse. The patient is not nervous/anxious.   All other systems reviewed and are negative.    EKGs/Labs/Other Studies Reviewed:    Studies reviewed were summarized above. The additional studies were reviewed today:  2D echo 05/2018: - Left ventricle: The cavity size was normal. There was mild   concentric hypertrophy. Systolic function was moderately reduced.   The estimated ejection fraction was in the range of 35% to 40%.   The study is not technically sufficient to allow evaluation of LV   diastolic function. - Aortic valve: There was mild regurgitation. - Mitral valve: There was moderate  regurgitation. - Left atrium: The atrium was moderately dilated. - Pulmonary arteries: Systolic pressure could not be accurately   estimated.  EKG:  EKG is ordered today.  The EKG ordered today demonstrates Afib, 63 bpm, LBBB (known)  Recent Labs: 11/12/2018: TSH 4.80 11/16/2018: Pro B Natriuretic peptide (  BNP) 901.0 12/18/2018: ALT 24; BUN 8; Creatinine, Ser 1.30; Hemoglobin 10.9; Platelets 186; Potassium 4.4; Sodium 131  Recent Lipid Panel    Component Value Date/Time   CHOL 104 11/12/2018 1008   CHOL 116 07/18/2016 0841   TRIG 62.0 11/12/2018 1008   HDL 55.90 11/12/2018 1008   HDL 41 07/18/2016 0841   CHOLHDL 2 11/12/2018 1008   VLDL 12.4 11/12/2018 1008   LDLCALC 36 11/12/2018 1008   LDLCALC 34 07/18/2016 0841    PHYSICAL EXAM:    VS:  BP 118/60 (BP Location: Left Arm, Patient Position: Sitting, Cuff Size: Normal)    Pulse 63    Ht 5\' 10"  (1.778 m)    Wt 164 lb (74.4 kg)    SpO2 98%    BMI 23.53 kg/m   BMI: Body mass index is 23.53 kg/m.  Physical Exam  Constitutional: He is oriented to person, place, and time. He appears well-developed and well-nourished.  HENT:  Head: Normocephalic and atraumatic.  Eyes: Right eye exhibits no discharge. Left eye exhibits no discharge.  Neck: Normal range of motion. No JVD present.  Cardiovascular: Normal rate, S1 normal and S2 normal. An irregularly irregular rhythm present. Exam reveals no distant heart sounds, no friction rub, no midsystolic click and no opening snap.  Murmur heard. High-pitched blowing holosystolic murmur is present with a grade of 1/6 at the apex. Pulses:      Posterior tibial pulses are 1+ on the right side and 1+ on the left side.  Pulmonary/Chest: Effort normal and breath sounds normal. No respiratory distress. He has no decreased breath sounds. He has no wheezes. He has no rales. He exhibits no tenderness.  Abdominal: Soft. He exhibits no distension. There is no abdominal tenderness.  Musculoskeletal:         General: No edema.  Neurological: He is alert and oriented to person, place, and time.  Skin: Skin is warm and dry. No cyanosis. Nails show no clubbing.  Psychiatric: He has a normal mood and affect. His speech is normal and behavior is normal. Judgment and thought content normal.    Wt Readings from Last 3 Encounters:  12/25/18 164 lb (74.4 kg)  12/18/18 163 lb 4.8 oz (74.1 kg)  11/16/18 168 lb 9 oz (76.5 kg)     ASSESSMENT & PLAN:   1. Permanent A. fib/flutter: Ventricular rate is well controlled.  Continue current dose of Toprol-XL for rate control.  Remains on Eliquis 5 mg daily without any symptoms concerning for bleeding.  Recent CBC demonstrating a stable hemoglobin as outlined above.  He has been followed by hematology for elevated ferritin level.  2. HFrEF secondary to nonischemic cardiomyopathy: He does not appear grossly volume overloaded and is well compensated.  His weight is down 10 pounds from his visit in 05/2018 which has been unintentional.  Recommend he follow-up with PCP as outlined below.  Continue current doses of Toprol-XL and Entresto.  Neither of these are escalated at this time given relative hypotension and heart rates in the low 60s.  Given recent AKI I have also deferred addition of spironolactone at this time.  I suspect some of his AKI is in the setting of dehydration with excessive alcohol use with minimal p.o. water intake.  He should be drinking no more than 2 L of water daily.  I have recommended he discontinue alcohol.  He is not interested in EP referral for consideration of CRT given his underlying cardiomyopathy with left bundle branch block.  3. Mitral regurgitation: Stable on most recent echo from 05/2018.  Follow-up echo at next visit.  4. Coronary artery calcification/aortic atherosclerosis: Patient indicates he has "never had chest pain."  Recent nonischemic Myoview as outlined above.  LDL at goal as outlined below.  Continue risk factor modification.   Remains on Eliquis in place of aspirin.  Continue Crestor.  5. Mild claudication: Symptoms are stable.  Continue with medical management.  Follow-up with Dr. Kirke Corin as directed.  6. Hyperlipidemia: LDL 36 from 10/2018.  Remains on Crestor.  7. Unintentional weight loss: Weight is down another 10 pounds.  Patient was previously noted to have heme positive stools with recommendation to follow through with colonoscopy.  He again indicates he is not interested in following up with this.  I recommend he follow-up with PCP.  8. Hypertension: Blood pressures well controlled today.  Continue current therapy as outlined above.  Disposition: F/u with Dr. [name] in 6 months.   Medication Adjustments/Labs and Tests Ordered: Current medicines are reviewed at length with the patient today.  Concerns regarding medicines are outlined above. Medication changes, Labs and Tests ordered today are summarized above and listed in the Patient Instructions accessible in Encounters.   Signed, Eula Listen, PA-C 12/25/2018 11:01 AM     [facility] [place] [address] [place], [place] [address] [phone]

## 2018-12-21 NOTE — Telephone Encounter (Signed)
Spoke with patient. He checked his b/p yesterday and it was 112/63. No other b/p readings available. Patient states he will check his b/p this evening or first thing in the morning and will call us back with that information. Patient states he is not having any symptoms of concern besides chronic issues. No dizziness or weakness.

## 2018-12-21 NOTE — Telephone Encounter (Signed)
Pt's age 73, wt 74.1 kg, SCr 1.3, CrCl 52.25, last ov @ RD 06/10/18

## 2018-12-21 NOTE — Telephone Encounter (Signed)
Noted. Thanks.  Will await update from patient.  

## 2018-12-25 ENCOUNTER — Encounter: Payer: Self-pay | Admitting: Physician Assistant

## 2018-12-25 ENCOUNTER — Ambulatory Visit (INDEPENDENT_AMBULATORY_CARE_PROVIDER_SITE_OTHER): Payer: Medicare Other | Admitting: Physician Assistant

## 2018-12-25 ENCOUNTER — Other Ambulatory Visit: Payer: Self-pay

## 2018-12-25 ENCOUNTER — Telehealth: Payer: Self-pay | Admitting: Family Medicine

## 2018-12-25 VITALS — BP 118/60 | HR 63 | Ht 70.0 in | Wt 164.0 lb

## 2018-12-25 DIAGNOSIS — I340 Nonrheumatic mitral (valve) insufficiency: Secondary | ICD-10-CM

## 2018-12-25 DIAGNOSIS — I2510 Atherosclerotic heart disease of native coronary artery without angina pectoris: Secondary | ICD-10-CM | POA: Diagnosis not present

## 2018-12-25 DIAGNOSIS — R634 Abnormal weight loss: Secondary | ICD-10-CM

## 2018-12-25 DIAGNOSIS — I739 Peripheral vascular disease, unspecified: Secondary | ICD-10-CM

## 2018-12-25 DIAGNOSIS — I4821 Permanent atrial fibrillation: Secondary | ICD-10-CM | POA: Diagnosis not present

## 2018-12-25 DIAGNOSIS — I700 Atherosclerosis of aorta: Secondary | ICD-10-CM

## 2018-12-25 DIAGNOSIS — I5022 Chronic systolic (congestive) heart failure: Secondary | ICD-10-CM | POA: Diagnosis not present

## 2018-12-25 DIAGNOSIS — I10 Essential (primary) hypertension: Secondary | ICD-10-CM

## 2018-12-25 DIAGNOSIS — I428 Other cardiomyopathies: Secondary | ICD-10-CM | POA: Diagnosis not present

## 2018-12-25 DIAGNOSIS — I2584 Coronary atherosclerosis due to calcified coronary lesion: Secondary | ICD-10-CM

## 2018-12-25 DIAGNOSIS — E785 Hyperlipidemia, unspecified: Secondary | ICD-10-CM

## 2018-12-25 NOTE — Telephone Encounter (Signed)
error 

## 2018-12-25 NOTE — Patient Instructions (Signed)
Medication Instructions:  - Your physician recommends that you continue on your current medications as directed. Please refer to the Current Medication list given to you today.  If you need a refill on your cardiac medications before your next appointment, please call your pharmacy.   Lab work: - none ordered  If you have labs (blood work) drawn today and your tests are completely normal, you will receive your results only by: Marland Kitchen MyChart Message (if you have MyChart) OR . A paper copy in the mail If you have any lab test that is abnormal or we need to change your treatment, we will call you to review the results.  Testing/Procedures: - none ordered  Follow-Up: At [facility], you and your health needs are our priority.  As part of our continuing mission to provide you with exceptional heart care, we have created designated Provider Care Teams.  These Care Teams include your primary Cardiologist (physician) and Advanced Practice Providers (APPs -  Physician Assistants and Nurse Practitioners) who all work together to provide you with the care you need, when you need it.  You will need a follow up appointment in 6 months. (February 2021)  Please call our office 2 months in advance to schedule this appointment. (Call in early December to schedule)   You may see Ida Rogue, MD or one of the following Advanced Practice Providers on your designated Care Team:   Murray Hodgkins, NP Christell Faith, PA-C . Marrianne Mood, PA-C  Any Other Special Instructions Will Be Listed Below (If Applicable). - N/A

## 2018-12-25 NOTE — Telephone Encounter (Signed)
Patient called and stated he had an appointment today with Dr Christell Faith.,  They checked his BP and it was 118/60

## 2018-12-27 NOTE — Telephone Encounter (Signed)
I am glad his blood pressure was reasonable. He has had weight loss, abnormal liver tests, blood in his stool.  I am concerned about all of that and there are multiple issues or problems that could be causing those troubles.  I realize he did not want to see the GI clinic previously.  Hematology notes state a plan to get a liver ultrasound set up.  When is this happening?  Is he willing to go see the GI clinic?  Please let me know.  Thanks.

## 2018-12-28 NOTE — Telephone Encounter (Signed)
Patient advised. Patient states he does not want to see GI clinic at this time. Patient states he does not recall anyone telling him about needing Liver US. He does agree with getting it done though and needs it scheduled in the morning but not too early.

## 2018-12-29 LAB — HEMOCHROMATOSIS DNA-PCR(C282Y,H63D)

## 2019-01-01 ENCOUNTER — Other Ambulatory Visit: Payer: Self-pay | Admitting: Oncology

## 2019-01-01 ENCOUNTER — Inpatient Hospital Stay: Payer: Medicare Other | Admitting: Oncology

## 2019-01-01 DIAGNOSIS — R7989 Other specified abnormal findings of blood chemistry: Secondary | ICD-10-CM

## 2019-01-01 NOTE — Telephone Encounter (Signed)
I'll check with Dr. Tasia Catchings about the liver ultrasound order.  Thanks.

## 2019-01-01 NOTE — Telephone Encounter (Signed)
Noted. Will await further instructions on this.

## 2019-01-08 ENCOUNTER — Ambulatory Visit
Admission: RE | Admit: 2019-01-08 | Discharge: 2019-01-08 | Disposition: A | Discharge status: Home / Self Care | Payer: Medicare Other | Source: Ambulatory Visit | Attending: Oncology | Admitting: Oncology

## 2019-01-08 ENCOUNTER — Other Ambulatory Visit: Payer: Self-pay

## 2019-01-08 DIAGNOSIS — R7989 Other specified abnormal findings of blood chemistry: Secondary | ICD-10-CM | POA: Diagnosis not present

## 2019-01-08 DIAGNOSIS — K8020 Calculus of gallbladder without cholecystitis without obstruction: Secondary | ICD-10-CM | POA: Diagnosis not present

## 2019-01-08 DIAGNOSIS — K760 Fatty (change of) liver, not elsewhere classified: Secondary | ICD-10-CM | POA: Diagnosis not present

## 2019-01-11 ENCOUNTER — Inpatient Hospital Stay: Payer: Medicare Other | Attending: Oncology | Admitting: Oncology

## 2019-01-11 DIAGNOSIS — I959 Hypotension, unspecified: Secondary | ICD-10-CM | POA: Insufficient documentation

## 2019-01-11 DIAGNOSIS — I4891 Unspecified atrial fibrillation: Secondary | ICD-10-CM | POA: Insufficient documentation

## 2019-01-11 DIAGNOSIS — I447 Left bundle-branch block, unspecified: Secondary | ICD-10-CM | POA: Insufficient documentation

## 2019-01-11 DIAGNOSIS — I5022 Chronic systolic (congestive) heart failure: Secondary | ICD-10-CM | POA: Insufficient documentation

## 2019-01-11 DIAGNOSIS — E039 Hypothyroidism, unspecified: Secondary | ICD-10-CM | POA: Insufficient documentation

## 2019-01-11 DIAGNOSIS — R5383 Other fatigue: Secondary | ICD-10-CM | POA: Insufficient documentation

## 2019-01-11 DIAGNOSIS — R7989 Other specified abnormal findings of blood chemistry: Secondary | ICD-10-CM | POA: Insufficient documentation

## 2019-01-11 DIAGNOSIS — Z79899 Other long term (current) drug therapy: Secondary | ICD-10-CM | POA: Insufficient documentation

## 2019-01-11 DIAGNOSIS — E785 Hyperlipidemia, unspecified: Secondary | ICD-10-CM | POA: Insufficient documentation

## 2019-01-11 DIAGNOSIS — D649 Anemia, unspecified: Secondary | ICD-10-CM | POA: Insufficient documentation

## 2019-01-11 DIAGNOSIS — F1010 Alcohol abuse, uncomplicated: Secondary | ICD-10-CM | POA: Insufficient documentation

## 2019-01-11 DIAGNOSIS — J449 Chronic obstructive pulmonary disease, unspecified: Secondary | ICD-10-CM | POA: Insufficient documentation

## 2019-01-11 DIAGNOSIS — E871 Hypo-osmolality and hyponatremia: Secondary | ICD-10-CM | POA: Insufficient documentation

## 2019-01-11 DIAGNOSIS — Z87891 Personal history of nicotine dependence: Secondary | ICD-10-CM | POA: Insufficient documentation

## 2019-01-15 ENCOUNTER — Inpatient Hospital Stay (HOSPITAL_BASED_OUTPATIENT_CLINIC_OR_DEPARTMENT_OTHER): Payer: Medicare Other | Admitting: Oncology

## 2019-01-15 ENCOUNTER — Encounter: Payer: Self-pay | Admitting: Oncology

## 2019-01-15 ENCOUNTER — Ambulatory Visit: Payer: Medicare Other | Admitting: Oncology

## 2019-01-15 ENCOUNTER — Other Ambulatory Visit: Payer: Self-pay

## 2019-01-15 VITALS — BP 75/41 | HR 56 | Wt 161.0 lb

## 2019-01-15 DIAGNOSIS — R7989 Other specified abnormal findings of blood chemistry: Secondary | ICD-10-CM

## 2019-01-15 DIAGNOSIS — I4891 Unspecified atrial fibrillation: Secondary | ICD-10-CM | POA: Diagnosis not present

## 2019-01-15 DIAGNOSIS — F1010 Alcohol abuse, uncomplicated: Secondary | ICD-10-CM | POA: Diagnosis not present

## 2019-01-15 DIAGNOSIS — J449 Chronic obstructive pulmonary disease, unspecified: Secondary | ICD-10-CM | POA: Diagnosis not present

## 2019-01-15 DIAGNOSIS — E871 Hypo-osmolality and hyponatremia: Secondary | ICD-10-CM | POA: Diagnosis not present

## 2019-01-15 DIAGNOSIS — E785 Hyperlipidemia, unspecified: Secondary | ICD-10-CM | POA: Diagnosis not present

## 2019-01-15 DIAGNOSIS — I447 Left bundle-branch block, unspecified: Secondary | ICD-10-CM | POA: Diagnosis not present

## 2019-01-15 DIAGNOSIS — I5022 Chronic systolic (congestive) heart failure: Secondary | ICD-10-CM | POA: Diagnosis not present

## 2019-01-15 DIAGNOSIS — R5383 Other fatigue: Secondary | ICD-10-CM | POA: Diagnosis not present

## 2019-01-15 DIAGNOSIS — I9589 Other hypotension: Secondary | ICD-10-CM | POA: Diagnosis not present

## 2019-01-15 DIAGNOSIS — Z79899 Other long term (current) drug therapy: Secondary | ICD-10-CM | POA: Diagnosis not present

## 2019-01-15 DIAGNOSIS — I959 Hypotension, unspecified: Secondary | ICD-10-CM | POA: Diagnosis not present

## 2019-01-15 DIAGNOSIS — D649 Anemia, unspecified: Secondary | ICD-10-CM | POA: Diagnosis not present

## 2019-01-15 DIAGNOSIS — Z87891 Personal history of nicotine dependence: Secondary | ICD-10-CM | POA: Diagnosis not present

## 2019-01-15 DIAGNOSIS — E039 Hypothyroidism, unspecified: Secondary | ICD-10-CM | POA: Diagnosis not present

## 2019-01-15 NOTE — Progress Notes (Signed)
Patient here today for follow up on lab work from new consult 2 wks ago.  Patient denies any nausea, vomiting, diarrhea, constipation or pain.

## 2019-01-15 NOTE — Progress Notes (Signed)
Hematology/Oncology  [facility] [phone] [phone]   Patient Care Team: Joaquim Namuncan, Graham S, MD as PCP - General (Family Medicine) Mariah MillingGollan, Tollie Pizzaimothy J, MD as PCP - Cardiology (Cardiology) Antonieta IbaGollan, Timothy J, MD as Consulting Physician (Cardiology)  REFERRING PROVIDER: Joaquim Namuncan, Graham S, MD  REASON FOR VISIT:  Follow-up for elevated iron level HISTORY OF PRESENTING ILLNESS:  Devin Ramsey is a  73 y.o.  male with PMH listed below was seen in consultation at the request of  Joaquim Namuncan, Graham S, MD  for evaluation of iron overload.  Patient had lab work done on 11/16/2018.  Found to have a ferritin level of 423.  07/16/2018 saturation 53.6.  Transferrin 144. Patient had a history of atrial fibrillation, LBBB, chronic systolic CHF, 05/29/2018 2D echo showed LVEF 35-40% History of chronic alcohol use, 2-3 beers a day on average Former smoker, quit in 2016.  History of 55-year pack smoking.  Today in the clinic, he feels little weak.  Did not eat much breakfast in the morning.  Blood pressure was low, first reading 71/41, heart rate 68, second reading 85/57, with heart rate of 59. Denies any dizziness or lightheadedness. Patient is on Toprol XL 25 mg daily, Lasix 20 mg daily,entresto 24-26mg  BID.  He took all his medication the morning.  INTERVAL HISTORY Devin Ramsey is a 73 y.o. male who has above history reviewed by me today presents for follow up visit for management of elevated iron level Problems and complaints are listed below: Patient was hypotensive during his last visit which improved after oral hydration. He followed up with primary care doctor's office Dr. Para Marchuncan and reported to have normal blood pressure at that time. Today his blood pressure is low again in the clinic 75/41.  He denies any dizziness or lightheadedness. I repeated a blood pressure measurement with manual cuff with blood pressure reading 85/40. He has no new complaints. Had  ultrasound right upper quadrant abdomen done during the interval.  Present to discuss about image results and lab results.  Review of Systems  Constitutional: Positive for fatigue. Negative for appetite change, chills, fever and unexpected weight change.  HENT:   Negative for hearing loss and voice change.   Eyes: Negative for eye problems and icterus.  Respiratory: Negative for chest tightness, cough and shortness of breath.   Cardiovascular: Negative for chest pain and leg swelling.  Gastrointestinal: Negative for abdominal distention and abdominal pain.  Endocrine: Negative for hot flashes.  Genitourinary: Negative for difficulty urinating, dysuria and frequency.   Musculoskeletal: Negative for arthralgias.  Skin: Negative for itching and rash.  Neurological: Negative for dizziness, light-headedness and numbness.  Hematological: Negative for adenopathy. Does not bruise/bleed easily.  Psychiatric/Behavioral: Negative for confusion.    MEDICAL HISTORY:  Past Medical History:  Diagnosis Date  . Anemia   . Bilateral pleural effusion   . Chronic systolic CHF (congestive heart failure) (HCC)    a. TTE 2/16: EF of 35-40%, WMA consistent with BBB, normal sized LA; b. TTE 11/16: F of 35-40%, mild concentric LVH, no RWMA, normal LV diastolic function parameters, mild to moderate AI, moderate MR, mildly to moderately dilated left atrium measuring 42 mm  . COPD (chronic obstructive pulmonary disease) (HCC)   . Essential hypertension   . Gout   . HLD (hyperlipidemia)   . Hypothyroidism   . LBBB (left bundle branch block)   . Permanent atrial fibrillation    a. CHADS2VSC => 4 (CHF, HTN, age x 1, vascular  disease); b. on Eliquis  . Pneumonia 2016  . Tobacco abuse     SURGICAL HISTORY: Past Surgical History:  Procedure Laterality Date  . HEMORRHOID SURGERY    . SHOULDER SURGERY     right shoulder   . TONSILLECTOMY      SOCIAL HISTORY: Social History   Socioeconomic History  .  Marital status: Married    Spouse name: Not on file  . Number of children: Not on file  . Years of education: Not on file  . Highest education level: Not on file  Occupational History  . Not on file  Social Needs  . Financial resource strain: Not on file  . Food insecurity    Worry: Not on file    Inability: Not on file  . Transportation needs    Medical: Not on file    Non-medical: Not on file  Tobacco Use  . Smoking status: Former Smoker    Years: 55.00    Types: Cigarettes    Quit date: 06/27/2014    Years since quitting: 4.5  . Smokeless tobacco: Never Used  Substance and Sexual Activity  . Alcohol use: Yes    Alcohol/week: 15.0 standard drinks    Types: 15 Cans of beer per week    Comment: 2-3 beers a day average  . Drug use: No  . Sexual activity: Not Currently  Lifestyle  . Physical activity    Days per week: Not on file    Minutes per session: Not on file  . Stress: Not on file  Relationships  . Social Musician on phone: Not on file    Gets together: Not on file    Attends religious service: Not on file    Active member of club or organization: Not on file    Attends meetings of clubs or organizations: Not on file    Relationship status: Not on file  . Intimate partner violence    Fear of current or ex partner: Not on file    Emotionally abused: Not on file    Physically abused: Not on file    Forced sexual activity: Not on file  Other Topics Concern  . Not on file  Social History Narrative   Married 1966   From [place]   1 son, local    FAMILY HISTORY: Family History  Adopted: Yes  Family history unknown: Yes    ALLERGIES:  is allergic to codeine and indocin [indomethacin].  MEDICATIONS:  Current Outpatient Medications  Medication Sig Dispense Refill  . clobetasol cream (TEMOVATE) 0.05 % Apply 1 application topically 2 (two) times daily as needed. Use the least amount possible. 45 g 2  . ELIQUIS 5 MG TABS tablet Take 1  tablet by mouth twice daily 180 tablet 0  . furosemide (LASIX) 20 MG tablet Take 1 tablet (20 mg total) by mouth daily as needed. 90 tablet 3  . hydrOXYzine (ATARAX/VISTARIL) 10 MG tablet TAKE 1/2 TO 1 (ONE-HALF TO ONE) TABLET BY MOUTH EVERY 8 HOURS AS NEEDED FOR ITCHING 30 tablet 0  . levothyroxine (SYNTHROID) 50 MCG tablet Take 1 tablet (50 mcg total) by mouth daily. 90 tablet 1  . metoprolol succinate (TOPROL-XL) 25 MG 24 hr tablet Take 1 tablet (25 mg total) by mouth daily. 90 tablet 3  . sacubitril-valsartan (ENTRESTO) 24-26 MG Take 1 tablet by mouth 2 (two) times daily. 180 tablet 3  . tiotropium (SPIRIVA HANDIHALER) 18 MCG inhalation capsule  Place 1 capsule (18 mcg total) into inhaler and inhale daily. 30 capsule 12  . triamcinolone cream (KENALOG) 0.1 % APPLY TOPICALLY DAILY AS NEEDED 454 g 1  . rosuvastatin (CRESTOR) 10 MG tablet Take 1 tablet (10 mg total) by mouth daily. 90 tablet 3   No current facility-administered medications for this visit.      PHYSICAL EXAMINATION: ECOG PERFORMANCE STATUS: 2 - Symptomatic, <50% confined to bed Vitals:   01/15/19 1113 01/15/19 1114  BP: (!) 84/48 (!) 75/41  Pulse: (!) 52 (!) 56   Filed Weights   01/15/19 1113  Weight: 161 lb (73 kg)    Physical Exam Constitutional:      General: He is not in acute distress.    Appearance: He is ill-appearing.     Comments: Sitting in wheelchair.  HENT:     Head: Normocephalic and atraumatic.  Eyes:     General: No scleral icterus.    Pupils: Pupils are equal, round, and reactive to light.  Neck:     Musculoskeletal: Normal range of motion and neck supple.  Cardiovascular:     Rate and Rhythm: Normal rate and regular rhythm.     Heart sounds: Normal heart sounds.  Pulmonary:     Effort: Pulmonary effort is normal. No respiratory distress.     Breath sounds: No wheezing.     Comments: Decreased breath sounds bilaterally. Abdominal:     General: Bowel sounds are normal. There is no  distension.     Palpations: Abdomen is soft. There is no mass.     Tenderness: There is no abdominal tenderness.  Musculoskeletal: Normal range of motion.        General: No deformity.  Skin:    General: Skin is warm and dry.     Findings: No erythema or rash.  Neurological:     Mental Status: He is alert and oriented to person, place, and time.     Cranial Nerves: No cranial nerve deficit.     Coordination: Coordination normal.  Psychiatric:        Behavior: Behavior normal.        Thought Content: Thought content normal.     LABORATORY DATA:  I have reviewed the data as listed Lab Results  Component Value Date   WBC 6.3 12/18/2018   HGB 10.9 (L) 12/18/2018   HCT 32.5 (L) 12/18/2018   MCV 100.6 (H) 12/18/2018   PLT 186 12/18/2018   Recent Labs    06/10/18 1034 11/12/18 1008 11/16/18 1053 12/18/18 1215  NA 132* 123* 125* 131*  K 4.9 4.6 4.9 4.4  CL 92* 94* 95* 98  CO2 23 21 22 23   GLUCOSE 87 92 89 96  BUN 5* 5* 6 8  CREATININE 0.93 0.93 1.07 1.30*  CALCIUM 8.8 7.8* 8.1* 8.5*  GFRNONAA 81  --   --  54*  GFRAA 94  --   --  >60  PROT  --  5.1*  --  6.1*  ALBUMIN  --  3.2*  --  3.6  AST  --  25  --  49*  ALT  --  11  --  24  ALKPHOS  --  91  --  119  BILITOT  --  1.1  --  1.5*   Iron/TIBC/Ferritin/ %Sat    Component Value Date/Time   IRON 173 12/18/2018 1215   TIBC 227 (L) 12/18/2018 1215   FERRITIN 354 (H) 12/18/2018 1215   IRONPCTSAT 76 (H) 12/18/2018 1215  RADIOGRAPHIC STUDIES: I have personally reviewed the radiological images as listed and agreed with the findings in the report. US Abdomen Limited Ruq  Result Date: 01/08/2019 CLINICAL DATA:  Iron overload EXAM: ULTRASOUND ABDOMEN LIMITED RIGHT UPPER QUADRANT COMPARISON:  01/24/2015 FINDINGS: Gallbladder: Small echogenic shadowing and mobile gallstones noted. Normal wall thickness measuring 2 mm. No Murphy's sign elicited. Stones measure 4 mm or less in size. Common bile duct: Diameter: 3 mm Liver:  Increased hepatic echogenicity suggesting hepatic steatosis, mild. No large focal hepatic abnormality or intrahepatic biliary dilatation. Portal vein is patent on color Doppler imaging with normal direction of blood flow towards the liver. Other: No free fluid or ascites. Included views the right kidney demonstrate no hydronephrosis. IMPRESSION: Cholelithiasis without signs of acute cholecystitis Mild hepatic steatosis Electronically Signed   By: Jerilynn Mages.  Shick M.D.   On: 01/08/2019 14:38     ASSESSMENT & PLAN:  1. Elevated ferritin   2. Anemia, unspecified type   3. Hyponatremia   4. Other specified hypotension   5. ETOH abuse    #Labs reviewed and discussed with patient. Persistently elevated iron saturation of 76, ferritin level decreased to 354 from 423 2 months ago. Patient carries a single mutation of H63D.  Hemochromatosis carrier. I had a discussion with patient that in general hemochromatosis heterozygous mutations are asymptomatic.  His ferritin level is less than 500. Right upper quadrant ultrasound showed mild hepatic steatosis. I am not too convinced that he has organ damage with iron overload. The abnormally elevated iron saturation can be a result of combination of chronic liver disease/alcohol use. Alcohol cessation discussed with patient. Baseline hemoglobin of 10.9.  I will hold phlebotomy at this point.  I will order ultrasound elastography of the liver for further evaluation of liver fibrosis.  Negative hepatitis panel.  #Hypotension in the clinic.  He declines any oral hydration or IV hydration today.  Asymptomatic Advised patient to follow-up.  With primary care physician.  #Chronic hypo-natremia,  Sodium level at 131 Mildly low urine osmolarity, normal serum osmolarity, normal urine sodium. probably secondary to chronic alcohol use.? Liver cirrhosis.     Orders Placed This Encounter  Procedures  . [place] ELASTOGRAPHY LIVER    [place] RUQ done 8/21    Standing Status:    Future    Standing Expiration Date:   03/16/2020    Order Specific Question:   Reason for Exam (SYMPTOM  OR DIAGNOSIS REQUIRED)    Answer:   elevated iron level. assess of liver iron deposition    Order Specific Question:   Preferred imaging location?    Answer:   [facility]  . CBC with Differential/Platelet    Standing Status:   Future    Standing Expiration Date:   01/15/2020  . Ferritin    Standing Status:   Future    Standing Expiration Date:   01/15/2020  . Iron and TIBC    Standing Status:   Future    Standing Expiration Date:   01/15/2020    All questions were answered. The patient knows to call the clinic with any problems questions or concerns.  cc Tonia Ghent, MD    Return of visit: 6 months or sooner if needed.   Earlie Server, MD, PhD Hematology Oncology [place] at [facility] 01/15/2019

## 2019-01-17 ENCOUNTER — Telehealth: Payer: Self-pay | Admitting: Family Medicine

## 2019-01-17 NOTE — Telephone Encounter (Signed)
Please check with patient.  If he is lightheaded or if he has a systolic blood pressure this persistently below 100 then let me know so we can consider adjusting his medications.  Thanks.

## 2019-01-18 ENCOUNTER — Other Ambulatory Visit: Payer: Self-pay | Admitting: Cardiovascular Disease

## 2019-01-18 MED ORDER — METOPROLOL SUCCINATE ER 25 MG PO TB24: 25.0000 mg | ORAL_TABLET | Freq: Every day | ORAL | 3 refills | Status: DC

## 2019-01-18 NOTE — Telephone Encounter (Signed)
°*  STAT* If patient is at the pharmacy, call can be transferred to refill team.   1. Which medications need to be refilled? (please list name of each medication and dose if known) metoprolol 25 mg  2. Which pharmacy/location (including street and city if local pharmacy) is medication to be sent to? [facility] on [address]  3. Do they need a 30 day or 90 day supply? 90 day

## 2019-01-18 NOTE — Telephone Encounter (Signed)
Tried to call patient on both telephone numbers and did not get an answer or answering machine. Will have to try to call patient back later.

## 2019-01-18 NOTE — Telephone Encounter (Signed)
Patient notified as instructed by telephone and verbalized understanding. Patient stated hat every time he goes to the [facility] their machine gets a low reading. Patient stated that he has not been lightheaded. Patient stated that his  blood pressure is fine when he goes to the cardiologist also. Patient just thinks there is something wrong with the machine at the [facility]. Patient stated if he has any problems he will let Dr. Damita Dunnings know.

## 2019-01-19 ENCOUNTER — Ambulatory Visit
Admission: RE | Admit: 2019-01-19 | Discharge: 2019-01-19 | Disposition: A | Discharge status: Home / Self Care | Payer: Medicare Other | Source: Ambulatory Visit | Attending: Oncology | Admitting: Oncology

## 2019-01-19 ENCOUNTER — Other Ambulatory Visit: Payer: Self-pay

## 2019-01-19 DIAGNOSIS — R7989 Other specified abnormal findings of blood chemistry: Secondary | ICD-10-CM

## 2019-01-19 NOTE — Telephone Encounter (Signed)
Noted.  Thanks.  I will await update from patient. 

## 2019-01-22 ENCOUNTER — Other Ambulatory Visit: Payer: Self-pay | Admitting: Family Medicine

## 2019-01-22 NOTE — Telephone Encounter (Signed)
Electronic refill request. Atarax Last office visit:   11/16/2018 Last Filled:    30 tablet 0 12/14/2018  Please advise.

## 2019-01-25 NOTE — Telephone Encounter (Signed)
Sent. Thanks.   

## 2019-02-01 ENCOUNTER — Other Ambulatory Visit: Payer: Self-pay | Admitting: Family Medicine

## 2019-02-02 ENCOUNTER — Ambulatory Visit (INDEPENDENT_AMBULATORY_CARE_PROVIDER_SITE_OTHER): Payer: Medicare Other

## 2019-02-02 DIAGNOSIS — Z23 Encounter for immunization: Secondary | ICD-10-CM | POA: Diagnosis not present

## 2019-03-01 ENCOUNTER — Telehealth: Payer: Self-pay | Admitting: Family Medicine

## 2019-03-01 MED ORDER — PREDNISONE 10 MG PO TABS: ORAL_TABLET | ORAL | 0 refills | Status: DC

## 2019-03-01 NOTE — Telephone Encounter (Signed)
Best number [phone]  Pt called stating he ran out of prednisone and would like a refill  Pt stated he has gout in left foot and ran out of meds   [facility] [address]

## 2019-03-01 NOTE — Telephone Encounter (Signed)
Sent.  Update us/seek eval if not better.  Thanks.

## 2019-03-01 NOTE — Telephone Encounter (Signed)
Patient notified & stated he would pick up from pharmacy.

## 2019-03-25 ENCOUNTER — Other Ambulatory Visit: Payer: Self-pay | Admitting: Family Medicine

## 2019-03-25 NOTE — Telephone Encounter (Signed)
Electronic refill request. Tramadol Last office visit:   11/16/2018 Last Filled:   #120   1 RF   On 12/02/2018 Please advise.

## 2019-03-26 NOTE — Telephone Encounter (Signed)
Sent. Thanks.   

## 2019-03-30 ENCOUNTER — Telehealth: Payer: Self-pay | Admitting: Family Medicine

## 2019-03-30 NOTE — Telephone Encounter (Signed)
Pt states he had a parking placard form filled out previously and his boss was going to take it to the [facility] but he lost it. Patient dropped off another to have filled out. Placed in [place] tower.

## 2019-03-30 NOTE — Telephone Encounter (Signed)
Form placed in Dr. [facility]'s In Box. 

## 2019-04-01 ENCOUNTER — Other Ambulatory Visit: Payer: Self-pay | Admitting: Family Medicine

## 2019-04-01 NOTE — Telephone Encounter (Signed)
Patient called back to check on form he needed, would like to know if Dr Damita Dunnings had a chance to look at this

## 2019-04-01 NOTE — Telephone Encounter (Signed)
Electronic refill request. Hydroxyzine Last office visit:   11/16/2018 Last Filled:    30 tablet 1 01/25/2019  Please advise.

## 2019-04-02 NOTE — Telephone Encounter (Signed)
Patient advised. Left at front desk for pickup. 

## 2019-04-02 NOTE — Telephone Encounter (Signed)
Form done. Thanks. 

## 2019-04-02 NOTE — Telephone Encounter (Signed)
Sent. Thanks.   

## 2019-04-13 ENCOUNTER — Other Ambulatory Visit: Payer: Self-pay

## 2019-04-13 NOTE — Patient Outreach (Signed)
[facility]) Care Management  04/13/2019  Devin Ramsey [birth year] [id number]   Medication Adherence call to Mr. Devin Ramsey HIPPA Compliant Voice message left with a call back number. Mr. Devin Ramsey is showing past due on Rosuvastatin 10 mg under [place].   [facility] Management Direct Dial [phone]  Fax [phone] [email or web address]

## 2019-04-21 ENCOUNTER — Other Ambulatory Visit: Payer: Self-pay

## 2019-04-21 NOTE — Patient Outreach (Signed)
[facility]) Care Management  04/21/2019  Devin Ramsey 25-Jun-1945 [id number]   Medication Adherence call to Mr. Devin Ramsey HIPPA Compliant Voice message left with a call back number. Mr. Devin Ramsey is showing past due on Rosuvastatin 10 mg under [place].   [place] Management Direct Dial [phone]  Fax [phone] [email or web address]

## 2019-04-28 ENCOUNTER — Other Ambulatory Visit: Payer: Self-pay | Admitting: Cardiovascular Disease

## 2019-04-29 NOTE — Telephone Encounter (Signed)
Eliquis refill request.

## 2019-04-29 NOTE — Telephone Encounter (Signed)
Eliquis 5mg  refill request received, pt is 73 yrs old, weight-73kg, Crea-1.30 on 12/18/2018, Diagnosis-Afib, and last seen by Christell Faith on 12/25/2018. Dose is appropriate based on dosing criteria. Will send in refill to requested pharmacy.

## 2019-05-10 ENCOUNTER — Other Ambulatory Visit: Payer: Self-pay | Admitting: Family Medicine

## 2019-05-10 NOTE — Telephone Encounter (Signed)
Electronic refill request. Prednisone Last office visit:   11/16/2018 Last Filled:    20 tablet 0 03/01/2019  Please advise.

## 2019-05-11 NOTE — Telephone Encounter (Signed)
Sent. Thanks.  Use sparingly.  Update me as needed.

## 2019-05-27 ENCOUNTER — Other Ambulatory Visit: Payer: Self-pay | Admitting: Family Medicine

## 2019-05-27 NOTE — Telephone Encounter (Signed)
Electronic refill request. Tramadol Last office visit:   11/16/2018 Last Filled:    120 tablet 1 03/26/2019  Please advise.

## 2019-05-28 NOTE — Telephone Encounter (Signed)
Sent. Thanks.   

## 2019-05-30 ENCOUNTER — Telehealth: Payer: Self-pay | Admitting: Family Medicine

## 2019-05-31 NOTE — Telephone Encounter (Signed)
LOV 11/16/2018. When do you want patient to follow up?

## 2019-06-01 NOTE — Telephone Encounter (Signed)
30-minute office visit this spring would be good.  We can do labs at the visit, he does not need to fast.  Thanks.  Prescription sent.

## 2019-06-02 NOTE — Telephone Encounter (Signed)
Patient scheduled.

## 2019-06-30 ENCOUNTER — Other Ambulatory Visit: Payer: Self-pay | Admitting: Family Medicine

## 2019-06-30 NOTE — Telephone Encounter (Signed)
Last office visit 11/16/2018 for CPE.  Last refilled 04/02/2019 for #30 with 1 refill.  Next Appt: 09/02/2019 for follow up and labs.

## 2019-06-30 NOTE — Telephone Encounter (Signed)
Sent. Thanks.   

## 2019-07-14 NOTE — Progress Notes (Signed)
Office Visit    Patient Name: Devin Ramsey Date of Encounter: 07/17/2019  Primary Care Provider:  [name], MD Primary Cardiologist:  Julien Nordmann, MD  Chief Complaint    74 yo male with h/o permanent Afib/flutter on Eliquis, HFpEF felt 2/2 NICM by MPI, HTN, HLD, LBBB, moderate aortic atherosclerosis, COPD, prior tobacco use quitting in 2016, hypothyroidism, anemia, gout, and who presents for 6 month follow-up.  Past Medical History    Past Medical History:  Diagnosis Date  . Anemia   . Bilateral pleural effusion   . Chronic systolic CHF (congestive heart failure) (HCC)    a. TTE 2/16: EF of 35-40%, WMA consistent with BBB, normal sized LA; b. TTE 11/16: F of 35-40%, mild concentric LVH, no RWMA, normal LV diastolic function parameters, mild to moderate AI, moderate MR, mildly to moderately dilated left atrium measuring 42 mm  . COPD (chronic obstructive pulmonary disease) (HCC)   . Essential hypertension   . Gout   . HLD (hyperlipidemia)   . Hypothyroidism   . LBBB (left bundle branch block)   . Permanent atrial fibrillation (HCC)    a. CHADS2VSC => 4 (CHF, HTN, age x 1, vascular disease); b. on Eliquis  . Pneumonia 2016  . Tobacco abuse    Past Surgical History:  Procedure Laterality Date  . HEMORRHOID SURGERY    . SHOULDER SURGERY     right shoulder   . TONSILLECTOMY      Allergies  Allergies  Allergen Reactions  . Codeine     Sedation.  Tolerates other opiates.    . Indocin [Indomethacin]     Elevated BP, would avoid given his h/o CHF    History of Present Illness    74 yo male with PMH as above. Prior 2016 CT showed extensive CAC, aorta, as well as the origin of the SMA and renal arteries. Afib reportedly dates back to at least 06/2014 by review of EKGs. 06/2014 echo showed EF 35-40% and WMA consistent with LBBB. EKG 08/2014, 02/2015, and 03/2015 with SR and 1st degree AVB. 03/2015 TTE showed EF 35-40%, mild concentric LVH, mild to moderate AI,  moderate MR, and mild to moderately dilated LAE (61mm). From that point forward, he was in Afib/flutter. He was seen 08/2017 with stable SOB. In late 2019, he had a heme (+) stool and Hgb 9-10 with colonoscopy recommended but not performed. Preop evaluation was performed with subsequent 05/29/2018 echo that showed stable EF 35-40%. MPI 06/03/18 showed no significant ischemia with a small region of fixed defect in the apical and apical anteroseptal region consistent with prior MI, EF 53%, and was ruled a low risk scan. LE arterial studies 05/29/2018 showed moderately reduced ABI at 0.5 on the R and 0.48 on the L. Duplex showed bilateral SFA occlusion with tibial dz.  When seen same day by Dr. Kirke Corin for vascular consult, he reported a long h/o ingrown toenail on the second L toe and was started on abx and referred to podiatry. He was felt to have minimal claudication likely due to poor functional capacity. No ulceration of his LE was noted. When seen in office 05/2018, he reported a rash x3 years and stable SOB. His weight was down 9 lbs with recommendation to f/u with PCP and GI as it was unintentional wt loss. Claudication sx were stable. He was seen 12/25/2018 and was doing well. He had not needed his Lasix in 1 year. He was drinking 5-18 of 12 oz  beers daily. He had not restarted smoking (quit 2016). He was uninterested in GI evaluation for heme (+) stools with associated anemia and wt. loss - it was noted his wt was down another 10 lbs in less than 8 months.  He was continued on Eliquis 5mg  daily with stable Hgb and reportedly followed by hematology for elevated ferritin level. Hypotension and heart rates in the 60s precluded escalation of GDMT. Recent AKI, suspected 2/2 dehydration and excessive EtOH use,  precluded addition of spironolactone. Hydration was encouraged as long as under 2L of water daily. He was not interested in EP referral for consideration of CRT given his underlying CM with LBBB. F/u with Dr.  as needed for PAD was recommended.    He returns to clinic today and states he is doing well from a cardiac standpoint. Vitals today are concerning, though improved from 8/20 on review of EMR. Clinic BP 90/60 with ventricular rate 41bpm and SpO2 83%. Long discussion with patient regarding concern with respect to current vitals and saturations. He denies any exposure to COVID-19. He denies any presyncope, syncope, or falls. No feelings of palpitations. He reports stable DOE/SOB. No CP. No reported orthopnea, abdominal distention, PND, early satiety, or LEE. He continues to drink 8-12 beers daily without desire to cut back at this time. He is not physically active. He denies restarting smoking with exam concerning for restart of smoking as below. Also on exam, it was noted that his extremities were blue / black, including his nose. His face overall appeared somewhat ashen. Given his vitals, all orientation questions asked and correctly answered. He indicated that he did drive himself to the office with request to call his son to pick him up declined. No reported s/sx of bleed, though he does indicate that he did not follow-up on heme positive stool today. He reports medication compliance. He also notes a desire to avoid any workup or significant treatment changes with further recommendations as outlined below.  Home Medications    Prior to Admission medications   Medication Sig Start Date End Date Taking? Authorizing Provider  clobetasol cream (TEMOVATE) 0.05 % Apply 1 application topically 2 (two) times daily as needed. Use the least amount possible. 02/17/17   04/19/17, MD  ELIQUIS 5 MG TABS tablet Take 1 tablet by mouth twice daily 04/29/19   14/10/20, MD  furosemide (LASIX) 20 MG tablet Take 1 tablet (20 mg total) by mouth daily as needed. 06/29/15   08/27/15, MD  hydrOXYzine (ATARAX/VISTARIL) 10 MG tablet TAKE 1/2 TO 1 (ONE-HALF TO ONE) TABLET BY MOUTH EVERY 8 HOURS AS NEEDED FOR  ITCHING 06/30/19   08/28/19, MD  levothyroxine (SYNTHROID) 50 MCG tablet Take 1 tablet (50 mcg total) by mouth daily. 11/16/18   11/18/18, MD  metoprolol succinate (TOPROL-XL) 25 MG 24 hr tablet Take 1 tablet (25 mg total) by mouth daily. 01/18/19   Dunn, 01/20/19, PA-C  predniSONE (DELTASONE) 10 MG tablet TAKE 2 TABLETS BY MOUTH ONCE DAILY AS NEEDED WITH FOOD FOR GOUT 05/11/19   05/13/19, MD  rosuvastatin (CRESTOR) 10 MG tablet Take 1 tablet by mouth once daily 04/29/19   14/10/20, MD  sacubitril-valsartan (ENTRESTO) 24-26 MG Take 1 tablet by mouth 2 (two) times daily. 06/10/18   Dunn, 06/12/18, PA-C  tiotropium (SPIRIVA HANDIHALER) 18 MCG inhalation capsule Place 1 capsule (18 mcg total) into inhaler and inhale daily. 10/03/16   10/05/16,  Dwana Curd, MD  traMADol (ULTRAM) 50 MG tablet TAKE 2 TABLETS BY MOUTH EVERY 12 HOURS AS NEEDED FOR  KNEE  PAIN 05/28/19   [name], MD  triamcinolone cream (KENALOG) 0.1 % APPLY TOPICALLY DAILY AS NEEDED 06/01/19   [name], MD    Review of Systems    He denies chest pain, palpitations, pnd, orthopnea, n, v, dizziness, syncope, edema, weight gain, or early satiety. He reports stable DOE/SOB   All other systems reviewed and are otherwise negative except as noted above.  Physical Exam    VS:  BP 90/60 (BP Location: Right Arm, Patient Position: Sitting, Cuff Size: Normal)   Pulse (!) 41   Ht 5\' 9"  (1.753 m)   Wt 164 lb (74.4 kg)   SpO2 (!) 83%   BMI 24.22 kg/m  , BMI Body mass index is 24.22 kg/m. GEN: Frail male. Seated in wheelchair.  HEENT: normal. Neck: No JVD, carotid bruits, or masses. Cardiac irregularly irregular with bradycardic rate, distant heart sounds, faint holosystolic murmur. No rubs, or gallops. No clubbing, cyanosis, edema.  Radials/DP/PT 1+ and equal bilaterally.  Respiratory:  Coarse breath sounds bilaterally. GI: Soft, nontender, nondistended, BS + x 4. MS: no deformity or atrophy. Skin: cold and  dry, no rash. Neuro:  AAO with extensive orientation questions today Psych: Normal affect.  Accessory Clinical Findings    ECG personally reviewed by me today - Atrial fibrillation with slow ventricular response (ventricular rate 40, repeat ventricular rate 50, subsequent rhythm strip performed), occasional PVCs, known LBBB - no acute changes.  VITALS Reviewed   Temp Readings from Last 3 Encounters:  12/18/18 98.8 F (37.1 C) (Tympanic)  11/16/18 97.8 F (36.6 C) (Tympanic)  07/16/18 97.6 F (36.4 C) (Oral)   BP Readings from Last 3 Encounters:  07/16/19 90/60  01/15/19 (!) 75/41  12/25/18 118/60   Pulse Readings from Last 3 Encounters:  07/16/19 (!) 41  01/15/19 (!) 56  12/25/18 63    Wt Readings from Last 3 Encounters:  07/16/19 164 lb (74.4 kg)  01/15/19 161 lb (73 kg)  12/25/18 164 lb (74.4 kg)     LABS  reviewed   CareEverwhere Labs present? Yes/No: Yes but not most current.  Lab Results  Component Value Date   WBC 6.3 12/18/2018   HGB 10.9 (L) 12/18/2018   HCT 32.5 (L) 12/18/2018   MCV 100.6 (H) 12/18/2018   PLT 186 12/18/2018   Lab Results  Component Value Date   CREATININE 1.30 (H) 12/18/2018   BUN 8 12/18/2018   NA 131 (L) 12/18/2018   K 4.4 12/18/2018   CL 98 12/18/2018   CO2 23 12/18/2018   Lab Results  Component Value Date   ALT 24 12/18/2018   AST 49 (H) 12/18/2018   ALKPHOS 119 12/18/2018   BILITOT 1.5 (H) 12/18/2018   Lab Results  Component Value Date   CHOL 104 11/12/2018   HDL 55.90 11/12/2018   LDLCALC 36 11/12/2018   TRIG 62.0 11/12/2018   CHOLHDL 2 11/12/2018    No results found for: HGBA1C Lab Results  Component Value Date   TSH 4.80 (H) 11/12/2018     STUDIES/PROCEDURES reviewed    12/2018 01/2019 abdomen IMPRESSION: Cholelithiasis without signs of acute cholecystitis Mild hepatic steatosis  05/29/2018 Echo - Left ventricle: The cavity size was normal. There was mild  concentric hypertrophy. Systolic function was  moderately reduced.  The estimated ejection fraction was in the range of 35% to 40%.  The study is not technically sufficient to allow evaluation of LV  diastolic function.  - Aortic valve: There was mild regurgitation.  - Mitral valve: There was moderate regurgitation.  - Left atrium: The atrium was moderately dilated.  - Pulmonary arteries: Systolic pressure could not be accurately  estimated.   05/29/2018 LE arterial Summary:  Right: Total occlusion noted in the superficial femoral artery. Total  occlusion noted in the anterior tibial artery. Total occlusion noted in  the peroneal artery.  Left: Total occlusion noted in the superficial femoral artery and/or  popliteal artery.  Suggest follow up study in [facility] consult scheduled for 05/29/18.  Vascular consult recommended.   05/29/2018 [place] LE ABI Summary:  Right: Resting right ankle-brachial index indicates moderate right lower  extremity arterial disease. The right toe-brachial index is abnormal.  ABI is the low end of "moderate".  Left: Resting left ankle-brachial index indicates severe left lower  extremity arterial disease. The left toe-brachial index is abnormal.   MPI 06/03/2018 Pharmacological myocardial perfusion imaging study with no significant  Ischemia Small region fixed defect apical and apical anteroseptal region consistent with prior MI  Normal wall motion, EF estimated at 53% No EKG changes concerning for ischemia at peak stress or in recovery. Low risk scan Signed, Dossie Arbour, MD, Ph.D [facility]  Assessment & Plan    Permanent Atrial fibrillation / Atrial flutter with bradycardic ventricular rate Known LBBB --Bradycardic ventricular rate -reports asx with this low rate. Denies palpitations. Due to bradycardic ventricular rate, discontinued Toprol XL. Will continue Entresto for now, given BP 90/60 and suspect it will further improve with washout of BB. As above, extensive orientation questions   answered correctly, given he drove himself to his appointment. Declined ED given SpO2 83% - no recent known COVID exposure. Declined reaching out to son to pick him up. Recommended he do not drive if presyncope or syncope with patient agreement. He remains on OAC. No s/sx of bleeding but states he did not follow up on  FOBT. Obtain CBC, TSH (previous TSH 4.8), BMET/Mg. Further medication/lab recommendations if needed pending labs. He is agreeable to EP referral for consideration of CRT given underlying CM with LBBB today. He continues to report drinking 8-12 beers daily with cessation advised.  HFrEF 2/2 NICM --Appears euvolemic on exam. Previous echo as above with EF 35-40%. Wt stable from 8/7 visit with previous report of declining wt. Discontinue Toprol XL as above. Continue Entresto for now. Will recheck a BMET/Mg. Optimal GDMT precluded by hypotension and bradycardic ventricular rate with history of AKI in the setting of alcohol use and dehydration. Continued to encourage hydration and increased oral intake, though under 2L daily and with a heart healthy and low salt diet. Alcohol cessation advised. He is agreeable to EP referral for consideration of CRT given underlying CM with LBBB today.   Valvular Dz: Mild AR, moderate MR --Denies any change in sx and reports stable DOE. Given it has been a year since his last echo with history of MR, recommended repeat echo. He declined and intends to update it only if sx. Reassess at RTC or next virtual visit.   CAD / Aortic atherosclerosis --No CP. Myoview above. Repeat lipids and liver function. LDL previously at goal. Aggressive risk factor modification including alcohol and smoking cessation. He reports ongoing smoking cessation; however, suspect that he either continues to smoke or is around someone that frequently smokes in close proximity based on today's exam and smell of his clothes. On  Eliquis in place of ASA. Repeat CBC. Continue Crestor.   HLD --As  above. LDL previously 36 and at goal <70. Repeat lipid/liver. Continue statin.  PAD --Declines symptoms of claudication or lower extremity weakness. Continue HLD and risk factor modification.   Hypertension --BP hypotensive. Discontinued Toprol XL and continued current small dose Entresto as he will likely have improved BP with discontinuation of BB.   COPD / H/o tobacco use  --Recommend follow-up with pulmonology. Recommend chronic oxygen use.  Recommend against smoking / second hand smoke.   Hypothyroid --As above, recheck TSH.  Gout --Denies PAD sx. Reports only the occasional flare of his gout.  Anemia / History of (+) FOBT  --Update CBC with further recommendations regarding [place] if needed at that time. Recommend follow-up with hematology and PCP.   Medication changes: Discontinue Toprol XL Labs ordered: CBC, BMET, Mg, lipid / liver, TSH Studies / Imaging ordered: Deferred recommended echo. Reassess at RTC. Disposition: RTC in 1 month or following appointment with EP.  Total time spent with patient today 50 minutes. This includes reviewing records, evaluating the patient, and coordinating care. Face-to-face time >50%.    Arvil Chaco, PA-C 07/17/2019, 5:27 AM

## 2019-07-15 ENCOUNTER — Encounter: Payer: Self-pay | Admitting: Oncology

## 2019-07-15 NOTE — Progress Notes (Signed)
Patient contacted for follow up. Pt has no concerns.

## 2019-07-16 ENCOUNTER — Other Ambulatory Visit: Payer: Self-pay

## 2019-07-16 ENCOUNTER — Encounter: Payer: Self-pay | Admitting: Physician Assistant

## 2019-07-16 ENCOUNTER — Telehealth: Payer: Self-pay | Admitting: Internal Medicine

## 2019-07-16 ENCOUNTER — Ambulatory Visit (INDEPENDENT_AMBULATORY_CARE_PROVIDER_SITE_OTHER): Payer: Medicare Other | Admitting: Physician Assistant

## 2019-07-16 ENCOUNTER — Inpatient Hospital Stay: Payer: Medicare Other | Admitting: Oncology

## 2019-07-16 ENCOUNTER — Inpatient Hospital Stay: Payer: Medicare Other

## 2019-07-16 ENCOUNTER — Other Ambulatory Visit (HOSPITAL_COMMUNITY): Payer: Self-pay

## 2019-07-16 VITALS — BP 90/60 | HR 41 | Ht 69.0 in | Wt 164.0 lb

## 2019-07-16 DIAGNOSIS — I447 Left bundle-branch block, unspecified: Secondary | ICD-10-CM

## 2019-07-16 DIAGNOSIS — I4892 Unspecified atrial flutter: Secondary | ICD-10-CM

## 2019-07-16 DIAGNOSIS — E785 Hyperlipidemia, unspecified: Secondary | ICD-10-CM | POA: Diagnosis not present

## 2019-07-16 DIAGNOSIS — E039 Hypothyroidism, unspecified: Secondary | ICD-10-CM

## 2019-07-16 DIAGNOSIS — D649 Anemia, unspecified: Secondary | ICD-10-CM

## 2019-07-16 DIAGNOSIS — R0600 Dyspnea, unspecified: Secondary | ICD-10-CM

## 2019-07-16 DIAGNOSIS — I10 Essential (primary) hypertension: Secondary | ICD-10-CM

## 2019-07-16 DIAGNOSIS — I4821 Permanent atrial fibrillation: Secondary | ICD-10-CM

## 2019-07-16 DIAGNOSIS — E782 Mixed hyperlipidemia: Secondary | ICD-10-CM | POA: Diagnosis not present

## 2019-07-16 DIAGNOSIS — M1A0720 Idiopathic chronic gout, left ankle and foot, without tophus (tophi): Secondary | ICD-10-CM

## 2019-07-16 DIAGNOSIS — J449 Chronic obstructive pulmonary disease, unspecified: Secondary | ICD-10-CM

## 2019-07-16 DIAGNOSIS — I5020 Unspecified systolic (congestive) heart failure: Secondary | ICD-10-CM

## 2019-07-16 DIAGNOSIS — I340 Nonrheumatic mitral (valve) insufficiency: Secondary | ICD-10-CM

## 2019-07-16 DIAGNOSIS — I739 Peripheral vascular disease, unspecified: Secondary | ICD-10-CM

## 2019-07-16 DIAGNOSIS — I420 Dilated cardiomyopathy: Secondary | ICD-10-CM | POA: Diagnosis not present

## 2019-07-16 DIAGNOSIS — Z87891 Personal history of nicotine dependence: Secondary | ICD-10-CM

## 2019-07-16 NOTE — Telephone Encounter (Signed)
No ans

## 2019-07-16 NOTE — Patient Instructions (Addendum)
Medication Instructions:  1- STOP Metoprolol  *If you need a refill on your cardiac medications before your next appointment, please call your pharmacy*   Lab Work: Your physician recommends that you have lab work today(CBC, BMET, lipid w direct, Mag, LFT, TSH)   If you have labs (blood work) drawn today and your tests are completely normal, you will receive your results only by: Marland Kitchen MyChart Message (if you have MyChart) OR . A paper copy in the mail If you have any lab test that is abnormal or we need to change your treatment, we will call you to review the results.   Testing/Procedures: 1- Echo  Please return to [facility] on ______________ at _______________ AM/PM for an Echocardiogram. Your physician has requested that you have an echocardiogram. Echocardiography is a painless test that uses sound waves to create images of your heart. It provides your doctor with information about the size and shape of your heart and how well your heart's chambers and valves are working. This procedure takes approximately one hour. There are no restrictions for this procedure. Please note; depending on visual quality an IV may need to be placed.     Follow-Up: At [facility], you and your health needs are our priority.  As part of our continuing mission to provide you with exceptional heart care, we have created designated Provider Care Teams.  These Care Teams include your primary Cardiologist (physician) and Advanced Practice Providers (APPs -  Physician Assistants and Nurse Practitioners) who all work together to provide you with the care you need, when you need it.  We recommend signing up for the patient portal called "MyChart".  Sign up information is provided on this After Visit Summary.  MyChart is used to connect with patients for Virtual Visits (Telemedicine).  Patients are able to view lab/test results, encounter notes, upcoming appointments, etc.  Non-urgent messages can be  sent to your provider as well.   To learn more about what you can do with MyChart, go to [email or web address].    Your next appointment:   1- Urgent Devin Ramsey appt  2- Follow up with Dr. Mariah Ramsey or Devin Kindle, PA in 1 month.  Please do not drive until after you are cleared by Dr. Graciela Ramsey.

## 2019-07-16 NOTE — Telephone Encounter (Signed)
-----   Message from Jefferey Pica, RN sent at 07/16/2019 11:43 AM EST ----- Regarding: RE: Urgent appt Scheduling- can you please call the patient and see if he can come in to see Dr. Graciela Husbands on Tuesday 3/2 at 8:20 am?  ----- Message ----- From: Clovis Riley.Jodelle Gross, RN Sent: 07/16/2019  11:24 AM EST To: Duke Salvia, MD, Jefferey Pica, RN, # Subject: Urgent appt                                    This is a patient we are really worried about. His oxygen, HR and BP were low in clinic today and he refused to go to the ED. Eula Listen, PA has been trying to get patient in to see Graciela Husbands and patient finally agreed today.  HR in clinic 30s-50s. BP 90/60 (improved from last visit 75/41) O2 83  EKG showed slow a fib, they are being scanned into the computer.   Our next available with Graciela Husbands is 3/11. Can you look to see if there is anything sooner?

## 2019-07-17 ENCOUNTER — Encounter: Payer: Self-pay | Admitting: Physician Assistant

## 2019-07-17 LAB — LIPID PANEL
Chol/HDL Ratio: 1.7 ratio (ref 0.0–5.0)
Cholesterol, Total: 102 mg/dL (ref 100–199)
HDL: 60 mg/dL (ref >39)
LDL Chol Calc (NIH): 30 mg/dL (ref 0–99)
Triglycerides: 48 mg/dL (ref 0–149)
VLDL Cholesterol Cal: 12 mg/dL (ref 5–40)

## 2019-07-17 LAB — HEPATIC FUNCTION PANEL
ALT: 11 IU/L (ref 0–44)
AST: 21 IU/L (ref 0–40)
Albumin: 3.6 g/dL — ABNORMAL LOW (ref 3.7–4.7)
Alkaline Phosphatase: 90 IU/L (ref 39–117)
Bilirubin Total: 0.9 mg/dL (ref 0.0–1.2)
Bilirubin, Direct: 0.31 mg/dL (ref 0.00–0.40)
Total Protein: 5.7 g/dL — ABNORMAL LOW (ref 6.0–8.5)

## 2019-07-17 LAB — TSH: TSH: 12.3 u[IU]/mL — ABNORMAL HIGH (ref 0.450–4.500)

## 2019-07-17 LAB — CBC
Hematocrit: 31.2 % — ABNORMAL LOW (ref 37.5–51.0)
Hemoglobin: 10.8 g/dL — ABNORMAL LOW (ref 13.0–17.7)
MCH: 34.1 pg — ABNORMAL HIGH (ref 26.6–33.0)
MCHC: 34.6 g/dL (ref 31.5–35.7)
MCV: 98 fL — ABNORMAL HIGH (ref 79–97)
Platelets: 151 10*3/uL (ref 150–450)
RBC: 3.17 x10E6/uL — ABNORMAL LOW (ref 4.14–5.80)
RDW: 11.7 % (ref 11.6–15.4)
WBC: 5.1 10*3/uL (ref 3.4–10.8)

## 2019-07-17 LAB — BASIC METABOLIC PANEL
BUN/Creatinine Ratio: 7 — ABNORMAL LOW (ref 10–24)
BUN: 8 mg/dL (ref 8–27)
CO2: 22 mmol/L (ref 20–29)
Calcium: 8.1 mg/dL — ABNORMAL LOW (ref 8.6–10.2)
Chloride: 94 mmol/L — ABNORMAL LOW (ref 96–106)
Creatinine, Ser: 1.18 mg/dL (ref 0.76–1.27)
GFR calc Af Amer: 70 mL/min/{1.73_m2} (ref >59)
GFR calc non Af Amer: 60 mL/min/{1.73_m2} (ref >59)
Glucose: 82 mg/dL (ref 65–99)
Potassium: 4.7 mmol/L (ref 3.5–5.2)
Sodium: 126 mmol/L — ABNORMAL LOW (ref 134–144)

## 2019-07-17 LAB — MAGNESIUM: Magnesium: 2 mg/dL (ref 1.6–2.3)

## 2019-07-17 LAB — LDL CHOLESTEROL, DIRECT: LDL Direct: 38 mg/dL (ref 0–99)

## 2019-07-18 ENCOUNTER — Other Ambulatory Visit: Payer: Self-pay | Admitting: Family Medicine

## 2019-07-18 DIAGNOSIS — E039 Hypothyroidism, unspecified: Secondary | ICD-10-CM

## 2019-07-18 MED ORDER — LEVOTHYROXINE SODIUM 75 MCG PO TABS: 75.0000 ug | ORAL_TABLET | Freq: Every day | ORAL | 1 refills | Status: DC

## 2019-07-19 ENCOUNTER — Other Ambulatory Visit: Payer: Self-pay | Admitting: Family Medicine

## 2019-07-19 ENCOUNTER — Telehealth: Payer: Self-pay

## 2019-07-19 ENCOUNTER — Telehealth: Payer: Self-pay | Admitting: Licensed Clinical Social Worker

## 2019-07-19 MED ORDER — LEVOTHYROXINE SODIUM 75 MCG PO TABS: 75.0000 ug | ORAL_TABLET | Freq: Every day | ORAL | 3 refills | Status: DC

## 2019-07-19 NOTE — Telephone Encounter (Signed)
Lennon Alstrom, PA-C  07/18/2019 7:29 PM EST    Please let Mr. Mash know that his labs came back. His TSH is very high at 12.300. I recommend he see his PCP for adjustment of his synthroid dose or get a referral to endocrinology, as this could contribute to his slower HR . I will include his PCP on this result note. His calcium low. His albumin is low. His sodium is low. I am glad that he is scheduled for an echo, as we will want to reassess the pump function and pressures of his heart. He should stop drinking beer, and he should strictly limit the intake of all fluids. He should monitor for any confusion or breathing issues, and if he notices any concerning symptoms, he should go to the ED. His creatinine (kidneys) are stable from previous labs. His potassium borderline high but still within range. Please make sure he is not taking any OTC potassium supplements. His hemoglobin in low (he is anemic), though stable from previous labs, which is reassuring and we will continue his Eliquis. His cholesterol and LDL are at goal. We will await the results of his echo and his EP appointment.

## 2019-07-19 NOTE — Telephone Encounter (Signed)
I spoke with the patient regarding his lab results and Marisue Ivan, PA's recommendations.  The patient voices understanding of the result.  I have advised him that Dr. Lianne Bushy office should be reaching out to him about adjusting his synthroid. He does confirm with me that he is taking synthroid 50 mcg once daily. I have advised him that Dr. Para March has already sent in synthroid 75 mcg once daily to the pharmacy and he will need to pick this up and start taking it. He is aware they should reach out to him about scheduling a follow up lab appointment.   Again the patient voices understanding and is agreeable.

## 2019-07-19 NOTE — Telephone Encounter (Signed)
CSW referred to assist patient with obtaining a home pulse ox and BP cuff. CSW contacted patient to inform cuff and pulse ox will be delivered to home. CSW spoke with wife to inform and she was grateful for support and assistance. CSW available as needed. Lasandra Beech, LCSW, CCSW-MCS [phone]

## 2019-07-20 NOTE — Telephone Encounter (Signed)
I spoke with Dr. Graciela Husbands regarding the patient. I advised him I had spoken with the patient yesterday about his lab results from [place], [place] and that his TSH was 12.  Dr. Graciela Husbands made aware that Dr. Para March was adjusting the patient's synthroid up to 75 mcg once daily.  Per Dr. Graciela Husbands, if the patient is asymptomatic with his bradycardia at the present time, then it would make sense to push out his follow up with him until 08/12/19. The patient should monitor his HR's and if he is still running in the 30s in 2 weeks, he should call the office and we may need to place a Zio monitor on the patient.   If having symptoms, we can see this week.  I called and spoke with the patient. He states he is feeling good at the present time. I have advised him of Dr. Odessa Fleming recommendations above to push out his appt for a few weeks to see if his increased dose of synthroid will make a difference in his HRs. I have confirmed with the patient that he has a BP cuff at home. He will monitor his HRs and call us in 2 weeks if he is still bradycardiac in the 30-40s. Otherwise, we will see him on 08/12/19 at 9:00 am.   The patient voices understanding of the above and is agreeable.

## 2019-07-20 NOTE — Telephone Encounter (Signed)
-----   Message from Duke Salvia, MD sent at 07/19/2019  6:18 PM EST ----- Regarding: FW: Urgent appt Ladies  this guy can get seen on thrusday 3/4 at 0820 thx  Sk  ----- Message ----- From: Clovis Riley.Jodelle Gross, RN Sent: 07/16/2019  11:24 AM EST To: Duke Salvia, MD, Jefferey Pica, RN, # Subject: Urgent appt                                    This is a patient we are really worried about. His oxygen, HR and BP were low in clinic today and he refused to go to the ED. Eula Listen, PA has been trying to get patient in to see Graciela Husbands and patient finally agreed today.  HR in clinic 30s-50s. BP 90/60 (improved from last visit 75/41) O2 83  EKG showed slow a fib, they are being scanned into the computer.   Our next available with Graciela Husbands is 3/11. Can you look to see if there is anything sooner?

## 2019-07-26 ENCOUNTER — Telehealth: Payer: Self-pay

## 2019-07-26 NOTE — Telephone Encounter (Signed)
Patient calling States he spoke with PCP office and they will not be able to complete his lab work, they do not do outside orders Please call to discuss another option for lab work

## 2019-07-26 NOTE — Telephone Encounter (Signed)
Call to patient for updated vs.   Pt did received BP machine and pulse ox but does not have any current readings. He reported that he had been feeling well. I advised him to take vs once daily and record in a notebook that he will bring to next OV and will let us know if any readings are low.   Tsh order in per PCP. I told pt I would like it prior to OV with Graciela Husbands on 3/25. Pt verbalized understanding and in agreement with POC.   Pt has an appt tomorrow with oncologist.   Advised pt to call for any further questions or concerns.   Routing to provider as Lorain Childes.

## 2019-07-27 DIAGNOSIS — H40023 Open angle with borderline findings, high risk, bilateral: Secondary | ICD-10-CM | POA: Diagnosis not present

## 2019-07-27 NOTE — Telephone Encounter (Signed)
Call to patient to give update per last note. No new orders.   Advised pt to call for any further questions or concerns.

## 2019-07-27 NOTE — Telephone Encounter (Signed)
Regarding last note. Order was placed by PCP. No further orders needed, we just needs results shared with Dr. Graciela Husbands prior to that OV.

## 2019-07-28 ENCOUNTER — Inpatient Hospital Stay: Payer: Medicare Other | Attending: Oncology

## 2019-07-28 ENCOUNTER — Other Ambulatory Visit: Payer: Self-pay

## 2019-07-28 ENCOUNTER — Inpatient Hospital Stay (HOSPITAL_BASED_OUTPATIENT_CLINIC_OR_DEPARTMENT_OTHER): Payer: Medicare Other | Admitting: Oncology

## 2019-07-28 ENCOUNTER — Encounter: Payer: Self-pay | Admitting: Oncology

## 2019-07-28 VITALS — BP 103/54 | HR 68 | Temp 97.9°F | Resp 18

## 2019-07-28 DIAGNOSIS — Z7289 Other problems related to lifestyle: Secondary | ICD-10-CM

## 2019-07-28 DIAGNOSIS — Z7952 Long term (current) use of systemic steroids: Secondary | ICD-10-CM | POA: Diagnosis not present

## 2019-07-28 DIAGNOSIS — I5022 Chronic systolic (congestive) heart failure: Secondary | ICD-10-CM | POA: Diagnosis not present

## 2019-07-28 DIAGNOSIS — D649 Anemia, unspecified: Secondary | ICD-10-CM | POA: Insufficient documentation

## 2019-07-28 DIAGNOSIS — Z8719 Personal history of other diseases of the digestive system: Secondary | ICD-10-CM | POA: Diagnosis not present

## 2019-07-28 DIAGNOSIS — J449 Chronic obstructive pulmonary disease, unspecified: Secondary | ICD-10-CM | POA: Diagnosis not present

## 2019-07-28 DIAGNOSIS — Z87891 Personal history of nicotine dependence: Secondary | ICD-10-CM | POA: Insufficient documentation

## 2019-07-28 DIAGNOSIS — E039 Hypothyroidism, unspecified: Secondary | ICD-10-CM | POA: Insufficient documentation

## 2019-07-28 DIAGNOSIS — F1090 Alcohol use, unspecified, uncomplicated: Secondary | ICD-10-CM

## 2019-07-28 DIAGNOSIS — Z885 Allergy status to narcotic agent status: Secondary | ICD-10-CM | POA: Diagnosis not present

## 2019-07-28 DIAGNOSIS — R5383 Other fatigue: Secondary | ICD-10-CM | POA: Insufficient documentation

## 2019-07-28 DIAGNOSIS — Z7901 Long term (current) use of anticoagulants: Secondary | ICD-10-CM | POA: Diagnosis not present

## 2019-07-28 DIAGNOSIS — R7989 Other specified abnormal findings of blood chemistry: Secondary | ICD-10-CM | POA: Insufficient documentation

## 2019-07-28 DIAGNOSIS — Z79899 Other long term (current) drug therapy: Secondary | ICD-10-CM | POA: Insufficient documentation

## 2019-07-28 DIAGNOSIS — R531 Weakness: Secondary | ICD-10-CM | POA: Diagnosis not present

## 2019-07-28 DIAGNOSIS — R031 Nonspecific low blood-pressure reading: Secondary | ICD-10-CM | POA: Insufficient documentation

## 2019-07-28 DIAGNOSIS — D696 Thrombocytopenia, unspecified: Secondary | ICD-10-CM | POA: Diagnosis not present

## 2019-07-28 DIAGNOSIS — E785 Hyperlipidemia, unspecified: Secondary | ICD-10-CM | POA: Insufficient documentation

## 2019-07-28 DIAGNOSIS — Z148 Genetic carrier of other disease: Secondary | ICD-10-CM | POA: Diagnosis not present

## 2019-07-28 DIAGNOSIS — I4821 Permanent atrial fibrillation: Secondary | ICD-10-CM | POA: Insufficient documentation

## 2019-07-28 DIAGNOSIS — I110 Hypertensive heart disease with heart failure: Secondary | ICD-10-CM | POA: Insufficient documentation

## 2019-07-28 LAB — IRON AND TIBC
Iron: 95 ug/dL (ref 45–182)
Saturation Ratios: 35 % (ref 17.9–39.5)
TIBC: 273 ug/dL (ref 250–450)
UIBC: 178 ug/dL

## 2019-07-28 LAB — FERRITIN: Ferritin: 171 ng/mL (ref 24–336)

## 2019-07-28 LAB — CBC WITH DIFFERENTIAL/PLATELET
Abs Immature Granulocytes: 0.01 10*3/uL (ref 0.00–0.07)
Basophils Absolute: 0.1 10*3/uL (ref 0.0–0.1)
Basophils Relative: 2 %
Eosinophils Absolute: 0.2 10*3/uL (ref 0.0–0.5)
Eosinophils Relative: 5 %
HCT: 30.8 % — ABNORMAL LOW (ref 39.0–52.0)
Hemoglobin: 10.1 g/dL — ABNORMAL LOW (ref 13.0–17.0)
Immature Granulocytes: 0 %
Lymphocytes Relative: 30 %
Lymphs Abs: 1.2 10*3/uL (ref 0.7–4.0)
MCH: 33.6 pg (ref 26.0–34.0)
MCHC: 32.8 g/dL (ref 30.0–36.0)
MCV: 102.3 fL — ABNORMAL HIGH (ref 80.0–100.0)
Monocytes Absolute: 0.4 10*3/uL (ref 0.1–1.0)
Monocytes Relative: 9 %
Neutro Abs: 2.2 10*3/uL (ref 1.7–7.7)
Neutrophils Relative %: 54 %
Platelets: 133 10*3/uL — ABNORMAL LOW (ref 150–400)
RBC: 3.01 MIL/uL — ABNORMAL LOW (ref 4.22–5.81)
RDW: 12.6 % (ref 11.5–15.5)
WBC: 3.9 10*3/uL — ABNORMAL LOW (ref 4.0–10.5)
nRBC: 0 % (ref 0.0–0.2)

## 2019-07-28 MED ORDER — VITAMIN B-12 1000 MCG PO TABS: 1000.0000 ug | ORAL_TABLET | Freq: Every day | ORAL | 1 refills | Status: DC

## 2019-07-28 NOTE — Progress Notes (Signed)
Hematology/Oncology  [place] Telephone:([phone] Fax:([phone]) [phone]   Patient Care Team: Tonia Ghent, MD as PCP - General (Family Medicine) Rockey Situ Kathlene November, MD as PCP - Cardiology (Cardiology) Minna Merritts, MD as Consulting Physician (Cardiology)  REFERRING PROVIDER: Tonia Ghent, MD  REASON FOR VISIT:  Follow-up for elevated iron level HISTORY OF PRESENTING ILLNESS:  Devin Ramsey is a  74 y.o.  male with PMH listed below was seen in consultation at the request of  Tonia Ghent, MD  for evaluation of iron overload.  Patient had lab work done on 11/16/2018.  Found to have a ferritin level of 423.  07/16/2018 saturation 53.6.  Transferrin 144. Patient had a history of atrial fibrillation, LBBB, chronic systolic CHF, 8/91/6945 2D echo showed LVEF 35-40% History of chronic alcohol use, 2-3 beers a day on average Former smoker, quit in 2016.  History of 55-year pack smoking.  Today in the clinic, he feels little weak.  Did not eat much breakfast in the morning.  Blood pressure was low, first reading 71/41, heart rate 68, second reading 85/57, with heart rate of 59. Denies any dizziness or lightheadedness. Patient is on Toprol XL 25 mg daily, Lasix 20 mg daily,entresto 24-26mg  BID.  He took all his medication the morning.  INTERVAL HISTORY Devin Ramsey is a 74 y.o. male who has above history reviewed by me today presents for follow up visit for management of elevated iron level Problems and complaints are listed below: Labs reviewed and discussed with patient.  Patient reports feeling well at baseline.  he feels more fatigued. Patient drinks 3-4 beers daily. Review of Systems  Constitutional: Positive for fatigue. Negative for appetite change, chills, fever and unexpected weight change.  HENT:   Negative for hearing loss and voice change.   Eyes: Negative for eye problems and icterus.  Respiratory: Negative for chest tightness, cough  and shortness of breath.   Cardiovascular: Negative for chest pain and leg swelling.  Gastrointestinal: Negative for abdominal distention and abdominal pain.  Endocrine: Negative for hot flashes.  Genitourinary: Negative for difficulty urinating, dysuria and frequency.   Musculoskeletal: Negative for arthralgias.  Skin: Negative for itching and rash.  Neurological: Negative for dizziness, light-headedness and numbness.  Hematological: Negative for adenopathy. Does not bruise/bleed easily.  Psychiatric/Behavioral: Negative for confusion.    MEDICAL HISTORY:  Past Medical History:  Diagnosis Date  . Anemia   . Bilateral pleural effusion   . Chronic systolic CHF (congestive heart failure) ([place])    a. TTE 2/16: EF of 35-40%, WMA consistent with BBB, normal sized LA; b. TTE 11/16: F of 35-40%, mild concentric LVH, no RWMA, normal LV diastolic function parameters, mild to moderate AI, moderate MR, mildly to moderately dilated left atrium measuring 42 mm  . COPD (chronic obstructive pulmonary disease) ([place])   . Essential hypertension   . Gout   . HLD (hyperlipidemia)   . Hypothyroidism   . LBBB (left bundle branch block)   . Permanent atrial fibrillation ([place])    a. CHADS2VSC => 4 (CHF, HTN, age x 1, vascular disease); b. on Eliquis  . Pneumonia 2016  . Tobacco abuse     SURGICAL HISTORY: Past Surgical History:  Procedure Laterality Date  . HEMORRHOID SURGERY    . SHOULDER SURGERY     right shoulder   . TONSILLECTOMY      SOCIAL HISTORY: Social History   Socioeconomic History  . Marital status: Married  Spouse name: Not on file  . Number of children: Not on file  . Years of education: Not on file  . Highest education level: Not on file  Occupational History  . Not on file  Tobacco Use  . Smoking status: Former Smoker    Years: 55.00    Types: Cigarettes    Quit date: 06/27/2014    Years since quitting: 5.0  . Smokeless tobacco: Never Used  Substance and Sexual  Activity  . Alcohol use: Yes    Alcohol/week: 15.0 standard drinks    Types: 15 Cans of beer per week    Comment: 2-3 beers a day average  . Drug use: No  . Sexual activity: Not Currently  Other Topics Concern  . Not on file  Social History Narrative   Married 1966   From [place] fan   1 son, local   Social Determinants of Health   Financial Resource Strain:   . Difficulty of Paying Living Expenses: Not on file  Food Insecurity:   . Worried About Programme researcher, broadcasting/film/video in the Last Year: Not on file  . Ran Out of Food in the Last Year: Not on file  Transportation Needs:   . Lack of Transportation (Medical): Not on file  . Lack of Transportation (Non-Medical): Not on file  Physical Activity:   . Days of Exercise per Week: Not on file  . Minutes of Exercise per Session: Not on file  Stress:   . Feeling of Stress : Not on file  Social Connections:   . Frequency of Communication with Friends and Family: Not on file  . Frequency of Social Gatherings with Friends and Family: Not on file  . Attends Religious Services: Not on file  . Active Member of Clubs or Organizations: Not on file  . Attends Banker Meetings: Not on file  . Marital Status: Not on file  Intimate Partner Violence:   . Fear of Current or Ex-Partner: Not on file  . Emotionally Abused: Not on file  . Physically Abused: Not on file  . Sexually Abused: Not on file    FAMILY HISTORY: Family History  Adopted: Yes  Family history unknown: Yes    ALLERGIES:  is allergic to codeine and indocin [indomethacin].  MEDICATIONS:  Current Outpatient Medications  Medication Sig Dispense Refill  . clobetasol cream (TEMOVATE) 0.05 % Apply 1 application topically 2 (two) times daily as needed. Use the least amount possible. 45 g 2  . ELIQUIS 5 MG TABS tablet Take 1 tablet by mouth twice daily 180 tablet 2  . furosemide (LASIX) 20 MG tablet Take 1 tablet (20 mg total) by mouth daily as needed. 90 tablet 3    . hydrOXYzine (ATARAX/VISTARIL) 10 MG tablet TAKE 1/2 TO 1 (ONE-HALF TO ONE) TABLET BY MOUTH EVERY 8 HOURS AS NEEDED FOR ITCHING 30 tablet 1  . levothyroxine (SYNTHROID) 75 MCG tablet Take 1 tablet (75 mcg total) by mouth daily. 90 tablet 3  . predniSONE (DELTASONE) 10 MG tablet TAKE 2 TABLETS BY MOUTH ONCE DAILY AS NEEDED WITH FOOD FOR GOUT 20 tablet 0  . rosuvastatin (CRESTOR) 10 MG tablet Take 1 tablet by mouth once daily 90 tablet 0  . sacubitril-valsartan (ENTRESTO) 24-26 MG Take 1 tablet by mouth 2 (two) times daily. 180 tablet 3  . traMADol (ULTRAM) 50 MG tablet TAKE 2 TABLETS BY MOUTH EVERY 12 HOURS AS NEEDED FOR  KNEE  PAIN 120 tablet 1  .  triamcinolone cream (KENALOG) 0.1 % APPLY TOPICALLY DAILY AS NEEDED 454 g 0  . tiotropium (SPIRIVA HANDIHALER) 18 MCG inhalation capsule Place 1 capsule (18 mcg total) into inhaler and inhale daily. (Patient not taking: Reported on 07/28/2019) 30 capsule 12  . vitamin B-12 (CYANOCOBALAMIN) 1000 MCG tablet Take 1 tablet (1,000 mcg total) by mouth daily. 90 tablet 1   No current facility-administered medications for this visit.     PHYSICAL EXAMINATION: ECOG PERFORMANCE STATUS: 2 - Symptomatic, <50% confined to bed Vitals:   07/28/19 1014  BP: (!) 103/54  Pulse: 68  Resp: 18  Temp: 97.9 F (36.6 C)   Filed Weights    Physical Exam Constitutional:      General: He is not in acute distress.    Appearance: He is not ill-appearing.     Comments: Sitting in wheelchair.  HENT:     Head: Normocephalic and atraumatic.  Eyes:     General: No scleral icterus.    Pupils: Pupils are equal, round, and reactive to light.  Cardiovascular:     Rate and Rhythm: Normal rate and regular rhythm.     Heart sounds: Normal heart sounds.  Pulmonary:     Effort: Pulmonary effort is normal. No respiratory distress.     Breath sounds: No wheezing.     Comments: Decreased breath sounds bilaterally. Abdominal:     General: Bowel sounds are normal. There is  no distension.     Palpations: Abdomen is soft. There is no mass.     Tenderness: There is no abdominal tenderness.  Musculoskeletal:        General: No deformity. Normal range of motion.     Cervical back: Normal range of motion and neck supple.  Skin:    General: Skin is warm and dry.     Findings: No erythema or rash.  Neurological:     Mental Status: He is alert and oriented to person, place, and time. Mental status is at baseline.     Cranial Nerves: No cranial nerve deficit.     Coordination: Coordination normal.  Psychiatric:        Mood and Affect: Mood normal.     LABORATORY DATA:  I have reviewed the data as listed Lab Results  Component Value Date   WBC 3.9 (L) 07/28/2019   HGB 10.1 (L) 07/28/2019   HCT 30.8 (L) 07/28/2019   MCV 102.3 (H) 07/28/2019   PLT 133 (L) 07/28/2019   Recent Labs    11/12/18 1008 11/12/18 1008 11/16/18 1053 12/18/18 1215 07/16/19 1109  NA 123*   < > 125* 131* 126*  K 4.6   < > 4.9 4.4 4.7  CL 94*   < > 95* 98 94*  CO2 21   < > 22 23 22   GLUCOSE 92   < > 89 96 82  BUN 5*   < > 6 8 8   CREATININE 0.93   < > 1.07 1.30* 1.18  CALCIUM 7.8*   < > 8.1* 8.5* 8.1*  GFRNONAA  --   --   --  54* 60  GFRAA  --   --   --  >60 70  PROT 5.1*  --   --  6.1* 5.7*  ALBUMIN 3.2*  --   --  3.6 3.6*  AST 25  --   --  49* 21  ALT 11  --   --  24 11  ALKPHOS 91  --   --  119 90  BILITOT 1.1  --   --  1.5* 0.9  BILIDIR  --   --   --   --  0.31   < > = values in this interval not displayed.   Iron/TIBC/Ferritin/ %Sat    Component Value Date/Time   IRON 95 07/28/2019 0941   TIBC 273 07/28/2019 0941   FERRITIN 171 07/28/2019 0941   IRONPCTSAT 35 07/28/2019 0941      RADIOGRAPHIC STUDIES: I have personally reviewed the radiological images as listed and agreed with the findings in the report. No results found.   ASSESSMENT & PLAN:  1. Elevated ferritin   2. Hemochromatosis carrier   3. Chronic alcohol use   4. Anemia, unspecified type     5. Thrombocytopenia (HCC)    #Labs reviewed and discussed with patient. Iron panel has improved during the interval.  Iron saturation at 35 and ferritin level 171. Previously elevated ferritin level and iron saturation level are mostly secondary to chronic alcohol use. Patient has heterozygous hemochromatosis mutation, no phlebotomy needed at this point.  Continue monitor I discussed with patient again about alcohol cessation.  He says he will try.  Anemia, hemoglobin decreased to 10.1.  MCV increased to 102. I will check vitamin B12 and folate level at the next visit.  Recommend patient to start oral vitamin B12 supplementation 1000 MCG daily. Thrombocytopenia, platelet count 133, slightly decreased.  Likely secondary to chronic alcohol use. Orders Placed This Encounter  Procedures  . CBC with Differential/Platelet    Standing Status:   Future    Standing Expiration Date:   07/27/2020  . Comprehensive metabolic panel    Standing Status:   Future    Standing Expiration Date:   07/27/2020  . Vitamin B12    Standing Status:   Future    Standing Expiration Date:   07/27/2020  . Folate    Standing Status:   Future    Standing Expiration Date:   07/27/2020  . Iron and TIBC    Standing Status:   Future    Standing Expiration Date:   07/27/2020  . Ferritin    Standing Status:   Future    Standing Expiration Date:   07/27/2020    All questions were answered. The patient knows to call the clinic with any problems questions or concerns.  cc [name], MD    Return of visit: 3 months  Rickard Patience, MD, PhD Hematology Oncology [facility] 07/28/2019

## 2019-07-28 NOTE — Progress Notes (Signed)
Patient does not offer any problems today.  

## 2019-07-29 ENCOUNTER — Institutional Professional Consult (permissible substitution): Payer: Medicare Other | Admitting: Internal Medicine

## 2019-08-06 ENCOUNTER — Other Ambulatory Visit: Payer: Medicare Other

## 2019-08-12 ENCOUNTER — Encounter: Payer: Self-pay | Admitting: Internal Medicine

## 2019-08-12 ENCOUNTER — Other Ambulatory Visit: Payer: Self-pay

## 2019-08-12 ENCOUNTER — Ambulatory Visit (INDEPENDENT_AMBULATORY_CARE_PROVIDER_SITE_OTHER): Payer: Medicare Other | Admitting: Internal Medicine

## 2019-08-12 VITALS — BP 103/54 | HR 73 | Ht 70.0 in | Wt 166.2 lb

## 2019-08-12 DIAGNOSIS — R001 Bradycardia, unspecified: Secondary | ICD-10-CM | POA: Diagnosis not present

## 2019-08-12 DIAGNOSIS — I4892 Unspecified atrial flutter: Secondary | ICD-10-CM | POA: Diagnosis not present

## 2019-08-12 DIAGNOSIS — I4821 Permanent atrial fibrillation: Secondary | ICD-10-CM

## 2019-08-12 NOTE — Progress Notes (Signed)
ELECTROPHYSIOLOGY CONSULT NOTE  Patient ID: Devin Ramsey, MRN: [id number], DOB/AGE: 11/17/1945 74 y.o. Admit date: (Not on file) Date of Consult: 08/12/2019  Primary Physician: Tonia Ghent, MD Primary Cardiologist: Olga Millers Devin Ramsey is a 74 y.o. male who is being seen today for the evaluation of bradycardia at the request of TG/JV.    HPI Devin Ramsey is a 74 y.o. male referred for consideration of CRT-P implantation for bradycardia in the setting of left bundle branch block.  Presyncope and lightheadedness has been significantly improved with interval decrease in elevation of his beta-blocker.  He notes that his heart rate is better at home.  Sort of back to normal.  Energy level is at his baseline.  Significant orthostatic lightheadedness occurs a couple of times a year.     History of atrial fibrillation described as permanent; on Eliquis.  Without bleeding.  Chronic shortness of breath.  No edema.  Nocturnal dyspnea.  No chest pain    DATE TEST EF   2/16 Echo   35-40 %   1/20 Echo   34-40 %   1/20 MYOVIEW 53% Fixed defect-anteroapical    Extensive vascular disease manifested by lower extremity occlusive disease, extensive calcification and aortic calcification  Thromboembolic risk factors ( age  -2, HTN-1 vascular disease-1 CHF-1) for a CHADSVASc Score of >=5   Past Medical History:  Diagnosis Date  . Anemia   . Bilateral pleural effusion   . Chronic systolic CHF (congestive heart failure) ([place])    a. TTE 2/16: EF of 35-40%, WMA consistent with BBB, normal sized LA; b. TTE 11/16: F of 35-40%, mild concentric LVH, no RWMA, normal LV diastolic function parameters, mild to moderate AI, moderate MR, mildly to moderately dilated left atrium measuring 42 mm  . COPD (chronic obstructive pulmonary disease) ([place])   . Essential hypertension   . Gout   . HLD (hyperlipidemia)   . Hypothyroidism   . LBBB (left bundle branch block)   . Permanent atrial  fibrillation ([place])    a. CHADS2VSC => 4 (CHF, HTN, age x 1, vascular disease); b. on Eliquis  . Pneumonia 2016  . Tobacco abuse       Surgical History:  Past Surgical History:  Procedure Laterality Date  . HEMORRHOID SURGERY    . SHOULDER SURGERY     right shoulder   . TONSILLECTOMY       Home Meds: Current Meds  Medication Sig  . clobetasol cream (TEMOVATE) 9.67 % Apply 1 application topically 2 (two) times daily as needed. Use the least amount possible.  Marland Kitchen ELIQUIS 5 MG TABS tablet Take 1 tablet by mouth twice daily  . furosemide (LASIX) 20 MG tablet Take 1 tablet (20 mg total) by mouth daily as needed.  . hydrOXYzine (ATARAX/VISTARIL) 10 MG tablet TAKE 1/2 TO 1 (ONE-HALF TO ONE) TABLET BY MOUTH EVERY 8 HOURS AS NEEDED FOR ITCHING  . levothyroxine (SYNTHROID) 75 MCG tablet Take 1 tablet (75 mcg total) by mouth daily.  . predniSONE (DELTASONE) 10 MG tablet TAKE 2 TABLETS BY MOUTH ONCE DAILY AS NEEDED WITH FOOD FOR GOUT  . rosuvastatin (CRESTOR) 10 MG tablet Take 1 tablet by mouth once daily  . sacubitril-valsartan (ENTRESTO) 24-26 MG Take 1 tablet by mouth 2 (two) times daily.  Marland Kitchen tiotropium (SPIRIVA HANDIHALER) 18 MCG inhalation capsule Place 1 capsule (18 mcg total) into inhaler and inhale daily.  . traMADol (ULTRAM) 50 MG tablet  TAKE 2 TABLETS BY MOUTH EVERY 12 HOURS AS NEEDED FOR  KNEE  PAIN  . triamcinolone cream (KENALOG) 0.1 % APPLY TOPICALLY DAILY AS NEEDED  . vitamin B-12 (CYANOCOBALAMIN) 1000 MCG tablet Take 1 tablet (1,000 mcg total) by mouth daily.    Allergies:  Allergies  Allergen Reactions  . Codeine     Sedation.  Tolerates other opiates.    . Indocin [Indomethacin]     Elevated BP, would avoid given his h/o CHF    Social History   Socioeconomic History  . Marital status: Married    Spouse name: Not on file  . Number of children: Not on file  . Years of education: Not on file  . Highest education level: Not on file  Occupational History  . Not on file   Tobacco Use  . Smoking status: Former Smoker    Years: 55.00    Types: Cigarettes    Quit date: 06/27/2014    Years since quitting: 5.1  . Smokeless tobacco: Never Used  Substance and Sexual Activity  . Alcohol use: Yes    Alcohol/week: 15.0 standard drinks    Types: 15 Cans of beer per week    Comment: 2-3 beers a day average  . Drug use: No  . Sexual activity: Not Currently  Other Topics Concern  . Not on file  Social History Narrative   Married 1966   From [place] fan   1 son, local   Social Determinants of Health   Financial Resource Strain:   . Difficulty of Paying Living Expenses:   Food Insecurity:   . Worried About Programme researcher, broadcasting/film/video in the Last Year:   . Barista in the Last Year:   Transportation Needs:   . Freight forwarder (Medical):   Marland Kitchen Lack of Transportation (Non-Medical):   Physical Activity:   . Days of Exercise per Week:   . Minutes of Exercise per Session:   Stress:   . Feeling of Stress :   Social Connections:   . Frequency of Communication with Friends and Family:   . Frequency of Social Gatherings with Friends and Family:   . Attends Religious Services:   . Active Member of Clubs or Organizations:   . Attends Banker Meetings:   Marland Kitchen Marital Status:   Intimate Partner Violence:   . Fear of Current or Ex-Partner:   . Emotionally Abused:   Marland Kitchen Physically Abused:   . Sexually Abused:      Family History  Adopted: Yes  Family history unknown: Yes     ROS:  Please see the history of present illness.     All other systems reviewed and negative.    Physical Exam:  Blood pressure (!) 103/54, pulse 73, height 5\' 10"  (1.778 m), weight 166 lb 4 oz (75.4 kg), SpO2 97 %. General: Well developed, well nourished somewhat disheveled male in no acute distress. Head: Normocephalic, atraumatic, sclera non-icteric, no xanthomas, nares are without discharge. EENT: normal  Lymph Nodes:  none Neck: Negative for carotid bruits.  JVD not elevated.  But examined sitting up in a wheelchair Back:without scoliosis kyphosis   Lungs: Clear bilaterally to auscultation without wheezes, rales, or rhonchi. Breathing is unlabored. Heart: Irregularly irregular rate and rhythm with a 2/6 murmur . No rubs, or gallops appreciated. Abdomen: Soft, non-tender, non-distended with normoactive bowel sounds. No hepatomegaly. No rebound/guarding. No obvious abdominal masses. Msk:  Strength and tone appear normal for  age. Extremities: No clubbing or cyanosis. No  edema.  Distal pedal pulses are 2+ and equal bilaterally. Skin: Warm and Dry Neuro: Alert and oriented X 3. CN III-XII intact Grossly normal sensory and motor function . Psych:  Responds to questions appropriately with a normal affect.      Labs: Cardiac Enzymes No results for input(s): CKTOTAL, CKMB, TROPONINI in the last 72 hours. CBC Lab Results  Component Value Date   WBC 3.9 (L) 07/28/2019   HGB 10.1 (L) 07/28/2019   HCT 30.8 (L) 07/28/2019   MCV 102.3 (H) 07/28/2019   PLT 133 (L) 07/28/2019   PROTIME: No results for input(s): LABPROT, INR in the last 72 hours. Chemistry No results for input(s): NA, K, CL, CO2, BUN, CREATININE, CALCIUM, PROT, BILITOT, ALKPHOS, ALT, AST, GLUCOSE in the last 168 hours.  Invalid input(s): LABALBU Lipids Lab Results  Component Value Date   CHOL 102 07/16/2019   HDL 60 07/16/2019   LDLCALC 30 07/16/2019   TRIG 48 07/16/2019   BNP Pro B Natriuretic peptide (BNP)  Date/Time Value Ref Range Status  11/16/2018 10:53 AM 901.0 (H) 0.0 - 100.0 pg/mL Final   Thyroid Function Tests: No results for input(s): TSH, T4TOTAL, T3FREE, THYROIDAB in the last 72 hours.  Invalid input(s): FREET3 Miscellaneous No results found for: DDIMER  Radiology/Studies:  No results found.  EKG: Atrial fibrillation/flutter at a rate of 73 Interval-13/43 PVCs-frequent Left bundle branch block   Assessment and Plan:   Atrial  fibrillation/flutter-permanent  Left bundle branch block-QRS duration 130  PVCs-frequent  Coronary artery disease with prior MI ([facility]ov[facility])  Blood pressure-relatively low  Bradycardia resolved off of beta-blockers   The patient has a relatively low blood pressure relative bradycardia both of which are significantly improved following the discontinuation of his beta-blockers.  He is currently at his baseline symptom wise.  It is not clear that device implantation so as to allow beta-blockade is indicated.  Hence, we will continue him with his other guideline directed therapy, avoid the heart rate slowing impact of his beta-blockers.  PVCs are also present.  But reviewing electrocardiograms back through 2020, PVCs are not noted.  Hence, unlikely contributing to his cardiomyopathy.  His left bundle branch block is a borderline duration; in the absence of really profound symptoms, would not undertake CRT for this degree of QRS duration.  We will again as needed   Sherryl Manges

## 2019-08-12 NOTE — Patient Instructions (Addendum)
Medication Instructions:  - Your physician recommends that you continue on your current medications as directed. Please refer to the Current Medication list given to you today.  *If you need a refill on your cardiac medications before your next appointment, please call your pharmacy*   Lab Work: - none ordered  If you have labs (blood work) drawn today and your tests are completely normal, you will receive your results only by: Marland Kitchen MyChart Message (if you have MyChart) OR . A paper copy in the mail If you have any lab test that is abnormal or we need to change your treatment, we will call you to review the results.   Testing/Procedures: - none ordered   Follow-Up: At [facility] you and your health needs are our priority.  As part of our continuing mission to provide you with exceptional heart care, we have created designated Provider Care Teams.  These Care Teams include your primary Cardiologist (physician) and Advanced Practice Providers (APPs -  Physician Assistants and Nurse Practitioners) who all work together to provide you with the care you need, when you need it.  We recommend signing up for the patient portal called "MyChart".  Sign up information is provided on this After Visit Summary.  MyChart is used to connect with patients for Virtual Visits (Telemedicine).  Patients are able to view lab/test results, encounter notes, upcoming appointments, etc.  Non-urgent messages can be sent to your provider as well.   To learn more about what you can do with MyChart, go to [email or web address].    Your next appointment:   - As needed with Dr. Graciela Husbands   - in 4-6 weeks with Dr. [name] (will reschedule from next weeks appointment  The format for your next appointment:   in Person  Provider:   as above   Other Instructions - n/a

## 2019-08-12 NOTE — Addendum Note (Signed)
Addended by: Efrain Sella on: 08/12/2019 09:01 AM   Modules accepted: Orders

## 2019-08-13 ENCOUNTER — Ambulatory Visit (INDEPENDENT_AMBULATORY_CARE_PROVIDER_SITE_OTHER): Payer: Medicare Other

## 2019-08-13 DIAGNOSIS — I4821 Permanent atrial fibrillation: Secondary | ICD-10-CM | POA: Diagnosis not present

## 2019-08-16 ENCOUNTER — Telehealth: Payer: Self-pay

## 2019-08-16 NOTE — Telephone Encounter (Signed)
Attempted to call patient. LMTCB 08/16/2019   

## 2019-08-16 NOTE — Telephone Encounter (Signed)
-----   Message from Lennon Alstrom, PA-C sent at 08/16/2019  2:20 PM EDT ----- Please let Mr. Barkan know that his echo showed his pump function is the same as his 05/2018 study and moderately reduced with EF 35 to 40%.  The walls of the bottom left chamber of his heart are not moving as well as they should. In addition, the right part of his heart is enlarged. The top right chamber is severely enlarged, and the bottom right chamber is mildly enlarged with mildly elevated pressures. These changes are most likely due to his known lung disease. We will continue to monitor them. He has a moderately leaky mitral valve and mild to moderately leaky aortic valve, which is the same as his 05/2018 echo study. We will still want to keep an eye on these valves as well.

## 2019-08-17 ENCOUNTER — Ambulatory Visit: Payer: Medicare Other | Admitting: Cardiovascular Disease

## 2019-08-18 NOTE — Telephone Encounter (Signed)
Spoke with patient. Attempted to review results.   Pt reported that if there is nothing new to do at this time he would like to hold off on hearing results until he can speak in person with Dr. [name] at next OV in may.   No new orders at this time.   Advised pt to call for any further questions or concerns.

## 2019-08-19 ENCOUNTER — Ambulatory Visit: Payer: Medicare Other

## 2019-08-19 ENCOUNTER — Telehealth: Payer: Self-pay

## 2019-08-19 ENCOUNTER — Other Ambulatory Visit: Payer: Self-pay

## 2019-08-19 NOTE — Telephone Encounter (Signed)
FYI- Called patient to complete medicare wellness visit. Patient stated that he did not have time right now and needed to cancel. I cancelled appointment.  Advised patient he could call back to reschedule or can complete at his upcoming office visit.

## 2019-08-20 NOTE — Telephone Encounter (Signed)
Noted. Thanks.

## 2019-08-24 ENCOUNTER — Other Ambulatory Visit: Payer: Self-pay | Admitting: Family Medicine

## 2019-08-24 NOTE — Telephone Encounter (Signed)
Electronic refill request. Tramadol Last office visit:   11/16/2018 Last Filled:    120 tablet 1 05/28/2019  Please advise.

## 2019-08-25 NOTE — Telephone Encounter (Signed)
Sent. Thanks.   

## 2019-09-02 ENCOUNTER — Other Ambulatory Visit: Payer: Self-pay

## 2019-09-02 ENCOUNTER — Ambulatory Visit (INDEPENDENT_AMBULATORY_CARE_PROVIDER_SITE_OTHER): Payer: Medicare Other | Admitting: Family Medicine

## 2019-09-02 ENCOUNTER — Encounter: Payer: Self-pay | Admitting: Family Medicine

## 2019-09-02 DIAGNOSIS — M25569 Pain in unspecified knee: Secondary | ICD-10-CM | POA: Diagnosis not present

## 2019-09-02 DIAGNOSIS — E039 Hypothyroidism, unspecified: Secondary | ICD-10-CM | POA: Diagnosis not present

## 2019-09-02 DIAGNOSIS — I10 Essential (primary) hypertension: Secondary | ICD-10-CM

## 2019-09-02 DIAGNOSIS — M109 Gout, unspecified: Secondary | ICD-10-CM

## 2019-09-02 DIAGNOSIS — E782 Mixed hyperlipidemia: Secondary | ICD-10-CM

## 2019-09-02 DIAGNOSIS — I5022 Chronic systolic (congestive) heart failure: Secondary | ICD-10-CM

## 2019-09-02 MED ORDER — SPIRIVA HANDIHALER 18 MCG IN CAPS: 18.0000 ug | ORAL_CAPSULE | Freq: Every day | RESPIRATORY_TRACT | 12 refills | Status: AC

## 2019-09-02 NOTE — Patient Instructions (Signed)
Let me see about options for a lightweight wheelchair.  I'll check with the blood clinic about rechecking your thyroid with your next set of labs there.  Don't change your meds for now.  Take care.  Glad to see you.

## 2019-09-02 NOTE — Progress Notes (Signed)
This visit occurred during the SARS-CoV-2 public health emergency.  Safety protocols were in place, including screening questions prior to the visit, additional usage of staff PPE, and extensive cleaning of exam room while observing appropriate contact time as indicated for disinfecting solutions.  Mobility d/w pt. In [facility] today at OV. We talked about his gait troubles.  "I get wore out with walking."  Going on for years, gradually getting worse.  No falls "in a long time."  He doesn't have trouble driving but can't walk without stopping to rest.  This predates his statin use.  No CP.  He is still living at home with wife, who has dementia.  His son is helping out some at home.  He has a parking sticker to use already.  He declined extra help at this point.  D/w pt.    D/w pt about colonoscopy at this point.  Patient wanted to defer, d/w pt about risk and benefit.  H/o elevated ferritin.  Followed by hematology.  Not due for f/u labs.    H/o elevated TSH.  Still on replacement.  No ADE on med.  He had been on with most recent dose increase.  Discussed with patient about checking TSH with the next set of labs.  Taking tramadol for knee pain at baseline with some relief w/o ADE on med.  "If I miss a few days, I can tell it."    Prednisone only used with gout flare.  Used prn.  No ADE on med.  If he catches the flare early, then he only needs 1-2 pills.    Hypertension:    Using medication without problems or lightheadedness: yes Chest pain with exertion: no Edema:no Short of breath: only with exertion, ie walking distance.  Prev labs d/w pt.   Still on entresto and prn lasix.    Elevated Cholesterol: Using medications without problems: yes Muscle aches: not from statin.   Diet compliance: encouraged.  Exercise: limited by exertional capacity.    Meds, vitals, and allergies reviewed.   PMH and SH reviewed  ROS: Per HPI unless specifically indicated in ROS section   GEN: nad, alert  and oriented, in wheelchair HEENT: ncat NECK: supple w/o LA CV: rrr. PULM: ctab, no inc wob ABD: soft, +bs EXT: trace BLE edema SKIN: no acute rash

## 2019-09-03 ENCOUNTER — Other Ambulatory Visit: Payer: Self-pay | Admitting: Family Medicine

## 2019-09-03 NOTE — Telephone Encounter (Signed)
Electronic refill request. Prednisone Last office visit:  09/02/2019 Last Filled:      20 tablet 0 05/11/2019  Please advise.

## 2019-09-03 NOTE — Telephone Encounter (Signed)
Sent. Thanks.   

## 2019-09-06 ENCOUNTER — Other Ambulatory Visit: Payer: Self-pay | Admitting: Oncology

## 2019-09-06 DIAGNOSIS — D649 Anemia, unspecified: Secondary | ICD-10-CM

## 2019-09-06 DIAGNOSIS — M109 Gout, unspecified: Secondary | ICD-10-CM | POA: Insufficient documentation

## 2019-09-06 NOTE — Assessment & Plan Note (Signed)
No adverse effect.  We will see about getting a repeat TSH with next set of labs.

## 2019-09-06 NOTE — Assessment & Plan Note (Signed)
No change in meds at this point.  Continue Entresto.  Previous labs discussed with patient.

## 2019-09-06 NOTE — Assessment & Plan Note (Signed)
Continue tramadol as needed.  He agrees.

## 2019-09-06 NOTE — Assessment & Plan Note (Signed)
Continue Crestor.  Previous labs discussed with patient.  He agrees.

## 2019-09-06 NOTE — Assessment & Plan Note (Signed)
Continue as needed gout when he has a flare.  He will update me as needed.  This is a reasonable option option at this point given his other concurrent medications and situation, especially given his previous response to prednisone.

## 2019-09-06 NOTE — Assessment & Plan Note (Signed)
History of CHF and COPD likely causing multifactorial decrease in exercise capacity.  Discussed with patient about options.  I will check on options for lightweight wheelchair at home.  No change in meds at this point.  Fall cautions discussed with patient.  I asked about extra help at home but he wanted to continue as is for now.  He will update me as needed.  Does not appear to be in acute failure at this point.

## 2019-09-20 ENCOUNTER — Telehealth: Payer: Self-pay | Admitting: Family Medicine

## 2019-09-20 NOTE — Progress Notes (Deleted)
Cardiology Office Note  Date:  09/20/2019   ID:  Whitfield, Dulay December 22, 1945, MRN [id number]  PCP:  [name], MD   No chief complaint on file.   HPI:  Mr. Eichelberger is a 74 year old gentleman with past medical history of Moderate diffuse aortic atherosclerosis on CT 2016 hospital 06/2014 with bronchitis symptoms,  Permanent atrial fibrillation,  chest x-ray showing pneumonia who was kept in the hospital for 5 days,  COPD exacerbation.  cardioversion 08/23/2014 for A. fib flutter,  Previous echocardiogram showed ejection fraction 35-40%, February 2016 in the setting of atrial fibrillation Stopped smoking 06/28/2014, wife still smokes He has previously declined ischemia workup He follows up today to discuss atrial fibrillation management  Rash with triamcinilone cream  Better on prednisone Takes loratidine, itch medication  SOB stable No chest pain in "three years" Takes lasix "twice a year" No edema  No regular exercise program Continues to have knee pain and difficulty walking Denies any significant change in his shortness of breath symptoms  x-ray of left knee shows moderate degenerative change   Tolerating eliquis 5 BID On entresto BID  EKG personally reviewed by myself on todays visit showing atrial flutter, rate 65  bpm,LBBB  Other past medical history EKG in January 2017 showing atrial fibrillation Prior EKG in October and November 2016 shows normal sinus rhythm with left bundle branch block  Total chol 216, LDL 126  previous Ejection fraction remains low at 35-40%, no significant improvement by restoring normal sinus rhythm  Lab work from the hospital showed sodium 132, potassium 5.1, hemoglobin A1c 5.3, negative cardiac enzymes, Total cholesterol   Chest x-ray with interstitial pulmonary edema, small bilateral pleural effusions  Echocardiogram from 06/29/2014 showing ejection fraction 35-40%, septal motion consistent with bundle branch  block, normal right ventricular systolic pressure, normal left atrial size   PMH:   has a past medical history of Anemia, Bilateral pleural effusion, Chronic systolic CHF (congestive heart failure) (HCC), COPD (chronic obstructive pulmonary disease) (HCC), Essential hypertension, Gout, HLD (hyperlipidemia), Hypothyroidism, LBBB (left bundle branch block), Permanent atrial fibrillation (HCC), Pneumonia (2016), and Tobacco abuse.  PSH:    Past Surgical History:  Procedure Laterality Date  . HEMORRHOID SURGERY    . SHOULDER SURGERY     right shoulder   . TONSILLECTOMY      Current Outpatient Medications  Medication Sig Dispense Refill  . clobetasol cream (TEMOVATE) 0.05 % Apply 1 application topically 2 (two) times daily as needed. Use the least amount possible. 45 g 2  . ELIQUIS 5 MG TABS tablet Take 1 tablet by mouth twice daily 180 tablet 2  . furosemide (LASIX) 20 MG tablet Take 1 tablet (20 mg total) by mouth daily as needed. 90 tablet 3  . hydrOXYzine (ATARAX/VISTARIL) 10 MG tablet TAKE 1/2 TO 1 (ONE-HALF TO ONE) TABLET BY MOUTH EVERY 8 HOURS AS NEEDED FOR ITCHING 30 tablet 1  . levothyroxine (SYNTHROID) 75 MCG tablet Take 1 tablet (75 mcg total) by mouth daily. 90 tablet 3  . predniSONE (DELTASONE) 10 MG tablet TAKE 2 TABLETS BY MOUTH ONCE DAILY AS NEEDED WITH FOOD FOR GOUT 20 tablet 0  . rosuvastatin (CRESTOR) 10 MG tablet Take 1 tablet by mouth once daily 90 tablet 0  . sacubitril-valsartan (ENTRESTO) 24-26 MG Take 1 tablet by mouth 2 (two) times daily. 180 tablet 3  . tiotropium (SPIRIVA HANDIHALER) 18 MCG inhalation capsule Place 1 capsule (18 mcg total) into inhaler and inhale daily. 30 capsule 12  .  traMADol (ULTRAM) 50 MG tablet TAKE 2 TABLETS BY MOUTH EVERY 12 HOURS AS NEEDED FOR  KNEE  PAIN 120 tablet 1  . triamcinolone cream (KENALOG) 0.1 % APPLY TOPICALLY DAILY AS NEEDED 454 g 0  . vitamin B-12 (CYANOCOBALAMIN) 1000 MCG tablet Take 1 tablet (1,000 mcg total) by mouth daily.  90 tablet 1   No current facility-administered medications for this visit.     Allergies:   Codeine and Indocin [indomethacin]   Social History:  The patient  reports that he quit smoking about 5 years ago. His smoking use included cigarettes. He quit after 55.00 years of use. He has never used smokeless tobacco. He reports current alcohol use of about 15.0 standard drinks of alcohol per week. He reports that he does not use drugs.   Family History:   He was adopted. Family history is unknown by patient.    Review of Systems: Review of Systems  Constitutional: Negative.   Respiratory: Positive for shortness of breath.   Cardiovascular: Negative.   Gastrointestinal: Negative.   Musculoskeletal: Positive for joint pain.  Neurological: Negative.   Psychiatric/Behavioral: Negative.   All other systems reviewed and are negative.    PHYSICAL EXAM: VS:  There were no vitals taken for this visit. , BMI There is no height or weight on file to calculate BMI. GEN: Well nourished, well developed, in no acute distress  HEENT: normal  Neck: no JVD, carotid bruits, or masses Cardiac irregularly irregular , no murmurs, rubs, or gallops,no edema  Respiratory:  Moderately decreased breath sounds throughout otherwise clear, normal work of breathing GI: soft, nontender, nondistended, + BS MS: no deformity or atrophy  Skin: warm and dry, + rash left flank, shoulders bilaterally Skin excoriation left brachial area Neuro:  Strength and sensation are intact Psych: euthymic mood, full affect    Recent Labs: 11/16/2018: Pro B Natriuretic peptide (BNP) 901.0 07/16/2019: ALT 11; BUN 8; Creatinine, Ser 1.18; Magnesium 2.0; Potassium 4.7; Sodium 126; TSH 12.300 07/28/2019: Hemoglobin 10.1; Platelets 133    Lipid Panel Lab Results  Component Value Date   CHOL 102 07/16/2019   HDL 60 07/16/2019   LDLCALC 30 07/16/2019   TRIG 48 07/16/2019      Wt Readings from Last 3 Encounters:  09/02/19 167  lb 1 oz (75.8 kg)  08/12/19 166 lb 4 oz (75.4 kg)  07/16/19 164 lb (74.4 kg)       ASSESSMENT AND PLAN:  Congestive dilated cardiomyopathy (HCC) - Continue current medications Long history of low blood pressure, no medication changes made Appears euvolemic  rash Resolved on prednisone  Chronic systolic CHF (congestive heart failure) (HCC) -  Denies shortness of breath, appears euvolemic Takes Lasix sparingly Chronically no fluid overload  Essential hypertension -  Blood pressure is well controlled on today's visit. No changes made to the medications. Stable  Permanent atrial fibrillation (HCC) -  Rate controlled on anticoagulation no changes made, stable   Hyperlipidemia Cholesterol is at goal on the current lipid regimen. No changes to the medications were made.  Repeat lab work through primary care   Total encounter time more than 25 minutes  Greater than 50% was spent in counseling and coordination of care with the patient   Disposition:   F/U  12 months   No orders of the defined types were placed in this encounter.    Signed, Esmond Plants, M.D., Ph.D. 09/20/2019  [facility] [place], [place] [phone]

## 2019-09-20 NOTE — Telephone Encounter (Signed)
  Community Resource Referral   [facility] 09/20/2019   Name: Devin Ramsey   MRN: [id number]   DOB: 1946-04-25   AGE: 74 y.o.   GENDER: male   PCP [name], MD.   Called pt regarding [employer] Referral for lightweight wheelchair. Pt stated that he would like an electric wheelchair if possible.  Dr. Para March, could you please send prescription for wheelchair to [facility] (accepts Medicaid and Med Adv) [email or web address] [phone]       Fax: [phone] or email to [email or web address]  or another preferred DME provider that accepts medicaid, it should be fully covered.  Could you please follow up with the pt to let them know the information.  [name]  Care Guide . Embedded Care Coordination [facility] [name]  [phone]

## 2019-09-21 ENCOUNTER — Ambulatory Visit: Payer: Medicare Other | Admitting: Cardiovascular Disease

## 2019-09-21 DIAGNOSIS — R001 Bradycardia, unspecified: Secondary | ICD-10-CM

## 2019-09-21 DIAGNOSIS — R0600 Dyspnea, unspecified: Secondary | ICD-10-CM

## 2019-09-21 DIAGNOSIS — J449 Chronic obstructive pulmonary disease, unspecified: Secondary | ICD-10-CM

## 2019-09-21 DIAGNOSIS — I4892 Unspecified atrial flutter: Secondary | ICD-10-CM

## 2019-09-21 DIAGNOSIS — I10 Essential (primary) hypertension: Secondary | ICD-10-CM

## 2019-09-21 DIAGNOSIS — I4821 Permanent atrial fibrillation: Secondary | ICD-10-CM

## 2019-09-21 DIAGNOSIS — I420 Dilated cardiomyopathy: Secondary | ICD-10-CM

## 2019-09-21 NOTE — Telephone Encounter (Signed)
See below.  I do not know if he is going to qualify for an electric wheelchair.  Please send a prescription for lightweight wheelchair and start with that.  I thank all involved.  Dx Chronic systolic CHF (congestive heart failure), [I50.22]

## 2019-09-21 NOTE — Telephone Encounter (Signed)
Called pt to let him know that the prescription will be sent in and if he has any questions to follow up with the practice. Thanks!

## 2019-09-22 ENCOUNTER — Encounter: Payer: Self-pay | Admitting: Cardiovascular Disease

## 2019-09-24 ENCOUNTER — Other Ambulatory Visit: Payer: Self-pay | Admitting: Family Medicine

## 2019-09-24 NOTE — Telephone Encounter (Signed)
Electronic refill request. Hydroxyzine 10 mg  Last office visit:   09/02/2019 Last Filled:     30 tablet 1 06/30/2019  Please advise.

## 2019-09-25 ENCOUNTER — Other Ambulatory Visit: Payer: Self-pay | Admitting: Cardiovascular Disease

## 2019-09-26 NOTE — Telephone Encounter (Signed)
Sent. Thanks.   

## 2019-10-02 ENCOUNTER — Other Ambulatory Visit: Payer: Self-pay | Admitting: Family Medicine

## 2019-10-04 NOTE — Telephone Encounter (Signed)
Sent. Thanks.   

## 2019-10-18 ENCOUNTER — Other Ambulatory Visit: Payer: Self-pay | Admitting: Physician Assistant

## 2019-10-21 ENCOUNTER — Other Ambulatory Visit: Payer: Self-pay | Admitting: Family Medicine

## 2019-10-21 NOTE — Telephone Encounter (Signed)
Last office visit 09/02/2019 for follow up labs.  Last refilled 10/04/2019 for #20 with no refills.  No future appointments.

## 2019-10-22 NOTE — Telephone Encounter (Signed)
Patient advised.

## 2019-10-22 NOTE — Telephone Encounter (Signed)
Sent but if he needs this frequently in the future then we need to consider other options.  If he has recurrent symptoms then please have him update [place].  Thanks.

## 2019-10-26 ENCOUNTER — Other Ambulatory Visit: Payer: Self-pay

## 2019-10-26 ENCOUNTER — Inpatient Hospital Stay: Payer: Medicare Other | Attending: Oncology

## 2019-10-26 DIAGNOSIS — R7989 Other specified abnormal findings of blood chemistry: Secondary | ICD-10-CM

## 2019-10-26 DIAGNOSIS — I447 Left bundle-branch block, unspecified: Secondary | ICD-10-CM | POA: Diagnosis not present

## 2019-10-26 DIAGNOSIS — D649 Anemia, unspecified: Secondary | ICD-10-CM | POA: Insufficient documentation

## 2019-10-26 DIAGNOSIS — I340 Nonrheumatic mitral (valve) insufficiency: Secondary | ICD-10-CM | POA: Insufficient documentation

## 2019-10-26 DIAGNOSIS — I5022 Chronic systolic (congestive) heart failure: Secondary | ICD-10-CM | POA: Diagnosis not present

## 2019-10-26 DIAGNOSIS — Z79899 Other long term (current) drug therapy: Secondary | ICD-10-CM | POA: Insufficient documentation

## 2019-10-26 DIAGNOSIS — I351 Nonrheumatic aortic (valve) insufficiency: Secondary | ICD-10-CM | POA: Diagnosis not present

## 2019-10-26 DIAGNOSIS — I4891 Unspecified atrial fibrillation: Secondary | ICD-10-CM | POA: Diagnosis not present

## 2019-10-26 DIAGNOSIS — Z7289 Other problems related to lifestyle: Secondary | ICD-10-CM | POA: Diagnosis not present

## 2019-10-26 DIAGNOSIS — E785 Hyperlipidemia, unspecified: Secondary | ICD-10-CM | POA: Diagnosis not present

## 2019-10-26 DIAGNOSIS — Z87891 Personal history of nicotine dependence: Secondary | ICD-10-CM | POA: Insufficient documentation

## 2019-10-26 DIAGNOSIS — E871 Hypo-osmolality and hyponatremia: Secondary | ICD-10-CM | POA: Diagnosis not present

## 2019-10-26 DIAGNOSIS — E039 Hypothyroidism, unspecified: Secondary | ICD-10-CM | POA: Diagnosis not present

## 2019-10-26 DIAGNOSIS — I10 Essential (primary) hypertension: Secondary | ICD-10-CM | POA: Insufficient documentation

## 2019-10-26 DIAGNOSIS — E538 Deficiency of other specified B group vitamins: Secondary | ICD-10-CM | POA: Diagnosis not present

## 2019-10-26 DIAGNOSIS — M25532 Pain in left wrist: Secondary | ICD-10-CM | POA: Insufficient documentation

## 2019-10-26 DIAGNOSIS — R531 Weakness: Secondary | ICD-10-CM | POA: Diagnosis not present

## 2019-10-26 LAB — COMPREHENSIVE METABOLIC PANEL
ALT: 17 U/L (ref 0–44)
AST: 26 U/L (ref 15–41)
Albumin: 3.8 g/dL (ref 3.5–5.0)
Alkaline Phosphatase: 88 U/L (ref 38–126)
Anion gap: 9 (ref 5–15)
BUN: 12 mg/dL (ref 8–23)
CO2: 22 mmol/L (ref 22–32)
Calcium: 8.5 mg/dL — ABNORMAL LOW (ref 8.9–10.3)
Chloride: 99 mmol/L (ref 98–111)
Creatinine, Ser: 1.37 mg/dL — ABNORMAL HIGH (ref 0.61–1.24)
GFR calc Af Amer: 58 mL/min — ABNORMAL LOW (ref >60)
GFR calc non Af Amer: 50 mL/min — ABNORMAL LOW (ref >60)
Glucose, Bld: 122 mg/dL — ABNORMAL HIGH (ref 70–99)
Potassium: 4.6 mmol/L (ref 3.5–5.1)
Sodium: 130 mmol/L — ABNORMAL LOW (ref 135–145)
Total Bilirubin: 1 mg/dL (ref 0.3–1.2)
Total Protein: 6.5 g/dL (ref 6.5–8.1)

## 2019-10-26 LAB — CBC WITH DIFFERENTIAL/PLATELET
Abs Immature Granulocytes: 0.01 10*3/uL (ref 0.00–0.07)
Basophils Absolute: 0.1 10*3/uL (ref 0.0–0.1)
Basophils Relative: 1 %
Eosinophils Absolute: 0.2 10*3/uL (ref 0.0–0.5)
Eosinophils Relative: 3 %
HCT: 29.3 % — ABNORMAL LOW (ref 39.0–52.0)
Hemoglobin: 10.3 g/dL — ABNORMAL LOW (ref 13.0–17.0)
Immature Granulocytes: 0 %
Lymphocytes Relative: 22 %
Lymphs Abs: 1.3 10*3/uL (ref 0.7–4.0)
MCH: 33.1 pg (ref 26.0–34.0)
MCHC: 35.2 g/dL (ref 30.0–36.0)
MCV: 94.2 fL (ref 80.0–100.0)
Monocytes Absolute: 0.5 10*3/uL (ref 0.1–1.0)
Monocytes Relative: 9 %
Neutro Abs: 3.9 10*3/uL (ref 1.7–7.7)
Neutrophils Relative %: 65 %
Platelets: 149 10*3/uL — ABNORMAL LOW (ref 150–400)
RBC: 3.11 MIL/uL — ABNORMAL LOW (ref 4.22–5.81)
RDW: 12.8 % (ref 11.5–15.5)
WBC: 6 10*3/uL (ref 4.0–10.5)
nRBC: 0 % (ref 0.0–0.2)

## 2019-10-26 LAB — TSH: TSH: 6.853 u[IU]/mL — ABNORMAL HIGH (ref 0.350–4.500)

## 2019-10-26 LAB — IRON AND TIBC
Iron: 91 ug/dL (ref 45–182)
Saturation Ratios: 30 % (ref 17.9–39.5)
TIBC: 301 ug/dL (ref 250–450)
UIBC: 210 ug/dL

## 2019-10-26 LAB — FOLATE: Folate: 14.3 ng/mL (ref >5.9)

## 2019-10-26 LAB — VITAMIN B12: Vitamin B-12: 193 pg/mL (ref 180–914)

## 2019-10-26 LAB — FERRITIN: Ferritin: 195 ng/mL (ref 24–336)

## 2019-10-28 ENCOUNTER — Encounter: Payer: Self-pay | Admitting: Oncology

## 2019-10-28 ENCOUNTER — Other Ambulatory Visit: Payer: Self-pay

## 2019-10-28 ENCOUNTER — Other Ambulatory Visit: Payer: Medicare Other

## 2019-10-28 ENCOUNTER — Inpatient Hospital Stay (HOSPITAL_BASED_OUTPATIENT_CLINIC_OR_DEPARTMENT_OTHER): Payer: Medicare Other | Admitting: Oncology

## 2019-10-28 ENCOUNTER — Inpatient Hospital Stay: Payer: Medicare Other

## 2019-10-28 VITALS — BP 116/55 | HR 64 | Temp 97.6°F | Wt 164.0 lb

## 2019-10-28 DIAGNOSIS — I447 Left bundle-branch block, unspecified: Secondary | ICD-10-CM | POA: Diagnosis not present

## 2019-10-28 DIAGNOSIS — I4891 Unspecified atrial fibrillation: Secondary | ICD-10-CM | POA: Diagnosis not present

## 2019-10-28 DIAGNOSIS — E538 Deficiency of other specified B group vitamins: Secondary | ICD-10-CM | POA: Diagnosis not present

## 2019-10-28 DIAGNOSIS — M25532 Pain in left wrist: Secondary | ICD-10-CM | POA: Diagnosis not present

## 2019-10-28 DIAGNOSIS — D649 Anemia, unspecified: Secondary | ICD-10-CM

## 2019-10-28 DIAGNOSIS — Z148 Genetic carrier of other disease: Secondary | ICD-10-CM | POA: Diagnosis not present

## 2019-10-28 DIAGNOSIS — R531 Weakness: Secondary | ICD-10-CM | POA: Diagnosis not present

## 2019-10-28 DIAGNOSIS — Z87891 Personal history of nicotine dependence: Secondary | ICD-10-CM | POA: Diagnosis not present

## 2019-10-28 DIAGNOSIS — F1090 Alcohol use, unspecified, uncomplicated: Secondary | ICD-10-CM

## 2019-10-28 DIAGNOSIS — Z7289 Other problems related to lifestyle: Secondary | ICD-10-CM | POA: Diagnosis not present

## 2019-10-28 DIAGNOSIS — I10 Essential (primary) hypertension: Secondary | ICD-10-CM | POA: Diagnosis not present

## 2019-10-28 DIAGNOSIS — I351 Nonrheumatic aortic (valve) insufficiency: Secondary | ICD-10-CM | POA: Diagnosis not present

## 2019-10-28 DIAGNOSIS — Z79899 Other long term (current) drug therapy: Secondary | ICD-10-CM | POA: Diagnosis not present

## 2019-10-28 DIAGNOSIS — E871 Hypo-osmolality and hyponatremia: Secondary | ICD-10-CM | POA: Diagnosis not present

## 2019-10-28 DIAGNOSIS — E785 Hyperlipidemia, unspecified: Secondary | ICD-10-CM | POA: Diagnosis not present

## 2019-10-28 DIAGNOSIS — I340 Nonrheumatic mitral (valve) insufficiency: Secondary | ICD-10-CM | POA: Diagnosis not present

## 2019-10-28 DIAGNOSIS — E039 Hypothyroidism, unspecified: Secondary | ICD-10-CM | POA: Diagnosis not present

## 2019-10-28 DIAGNOSIS — I5022 Chronic systolic (congestive) heart failure: Secondary | ICD-10-CM | POA: Diagnosis not present

## 2019-10-28 HISTORY — DX: Deficiency of other specified B group vitamins: E53.8

## 2019-10-28 LAB — URIC ACID: Uric Acid, Serum: 6.5 mg/dL (ref 3.7–8.6)

## 2019-10-28 NOTE — Progress Notes (Signed)
Hematology/Oncology  [facility] [phone] [phone]   Patient Care Team: [name], MD as PCP - General (Family Medicine) [name] Tollie Pizza, MD as PCP - Cardiology (Cardiology) Antonieta Iba, MD as Consulting Physician (Cardiology) Rickard Patience, MD as Consulting Physician (Oncology)  REFERRING PROVIDER: [name], MD  REASON FOR VISIT:  Follow-up for elevated iron level HISTORY OF PRESENTING ILLNESS:  Devin Ramsey is a  74 y.o.  male with PMH listed below was seen in consultation at the request of  [name], MD  for evaluation of iron overload.  Patient had lab work done on 11/16/2018.  Found to have a ferritin level of 423.  07/16/2018 saturation 53.6.  Transferrin 144. Patient had a history of atrial fibrillation, LBBB, chronic systolic CHF, 05/29/2018 2D echo showed LVEF 35-40% History of chronic alcohol use, 2-3 beers a day on average Former smoker, quit in 2016.  History of 55-year pack smoking.  Today in the clinic, he feels little weak.  Did not eat much breakfast in the morning.  Blood pressure was low, first reading 71/41, heart rate 68, second reading 85/57, with heart rate of 59. Denies any dizziness or lightheadedness. Patient is on Toprol XL 25 mg daily, Lasix 20 mg daily,entresto 24-26mg  BID.  He took all his medication the morning.  INTERVAL HISTORY Devin Ramsey is a 74 y.o. male who has above history reviewed by me today presents for follow up visit for management of elevated iron level Problems and complaints are listed below: Patient reports feeling well at baseline.  He reports a bump on his left wrist.  Bump is painful with palpation. Patient continues to drink alcohol.   Review of Systems  Constitutional: Negative for appetite change, chills, fatigue, fever and unexpected weight change.  HENT:   Negative for hearing loss and voice change.   Eyes: Negative for eye problems and icterus.   Respiratory: Negative for chest tightness, cough and shortness of breath.   Cardiovascular: Negative for chest pain and leg swelling.  Gastrointestinal: Negative for abdominal distention and abdominal pain.  Endocrine: Negative for hot flashes.  Genitourinary: Negative for difficulty urinating, dysuria and frequency.   Musculoskeletal: Negative for arthralgias.       Left wrist pain  Skin: Negative for itching and rash.  Neurological: Negative for dizziness, light-headedness and numbness.  Hematological: Negative for adenopathy. Does not bruise/bleed easily.  Psychiatric/Behavioral: Negative for confusion.    MEDICAL HISTORY:  Past Medical History:  Diagnosis Date  . Anemia   . B12 deficiency 10/28/2019  . Bilateral pleural effusion   . Chronic systolic CHF (congestive heart failure) (HCC)    a. TTE 2/16: EF of 35-40%, WMA consistent with BBB, normal sized LA; b. TTE 11/16: F of 35-40%, mild concentric LVH, no RWMA, normal LV diastolic function parameters, mild to moderate AI, moderate MR, mildly to moderately dilated left atrium measuring 42 mm  . COPD (chronic obstructive pulmonary disease) (HCC)   . Essential hypertension   . Gout   . HLD (hyperlipidemia)   . Hypothyroidism   . LBBB (left bundle branch block)   . Permanent atrial fibrillation (HCC)    a. CHADS2VSC => 4 (CHF, HTN, age x 1, vascular disease); b. on Eliquis  . Pneumonia 2016  . Tobacco abuse     SURGICAL HISTORY: Past Surgical History:  Procedure Laterality Date  . HEMORRHOID SURGERY    . SHOULDER SURGERY     right shoulder   .  TONSILLECTOMY      SOCIAL HISTORY: Social History   Socioeconomic History  . Marital status: Married    Spouse name: Not on file  . Number of children: Not on file  . Years of education: Not on file  . Highest education level: Not on file  Occupational History  . Not on file  Tobacco Use  . Smoking status: Former Smoker    Years: 55.00    Types: Cigarettes    Quit  date: 06/27/2014    Years since quitting: 5.3  . Smokeless tobacco: Never Used  Vaping Use  . Vaping Use: Never used  Substance and Sexual Activity  . Alcohol use: Yes    Alcohol/week: 15.0 standard drinks    Types: 15 Cans of beer per week    Comment: 2-3 beers a day average  . Drug use: No  . Sexual activity: Not Currently  Other Topics Concern  . Not on file  Social History Narrative   Married 1966   From [place] fan   1 son, local   Social Determinants of Health   Financial Resource Strain:   . Difficulty of Paying Living Expenses:   Food Insecurity:   . Worried About Programme researcher, broadcasting/film/video in the Last Year:   . Barista in the Last Year:   Transportation Needs:   . Freight forwarder (Medical):   Marland Kitchen Lack of Transportation (Non-Medical):   Physical Activity:   . Days of Exercise per Week:   . Minutes of Exercise per Session:   Stress:   . Feeling of Stress :   Social Connections:   . Frequency of Communication with Friends and Family:   . Frequency of Social Gatherings with Friends and Family:   . Attends Religious Services:   . Active Member of Clubs or Organizations:   . Attends Banker Meetings:   Marland Kitchen Marital Status:   Intimate Partner Violence:   . Fear of Current or Ex-Partner:   . Emotionally Abused:   Marland Kitchen Physically Abused:   . Sexually Abused:     FAMILY HISTORY: Family History  Adopted: Yes  Family history unknown: Yes    ALLERGIES:  is allergic to codeine and indocin [indomethacin].  MEDICATIONS:  Current Outpatient Medications  Medication Sig Dispense Refill  . clobetasol cream (TEMOVATE) 0.05 % Apply 1 application topically 2 (two) times daily as needed. Use the least amount possible. 45 g 2  . ELIQUIS 5 MG TABS tablet Take 1 tablet by mouth twice daily 180 tablet 2  . ENTRESTO 24-26 MG Take 1 tablet by mouth twice daily 180 tablet 0  . hydrOXYzine (ATARAX/VISTARIL) 10 MG tablet TAKE 1/2 TO 1 (ONE-HALF TO ONE) TABLET BY  MOUTH EVERY 8 HOURS AS NEEDED FOR ITCHING 30 tablet 2  . levothyroxine (SYNTHROID) 75 MCG tablet Take 1 tablet (75 mcg total) by mouth daily. 90 tablet 3  . rosuvastatin (CRESTOR) 10 MG tablet Take 1 tablet by mouth once daily 90 tablet 0  . traMADol (ULTRAM) 50 MG tablet TAKE 2 TABLETS BY MOUTH EVERY 12 HOURS AS NEEDED FOR  KNEE  PAIN 120 tablet 1  . triamcinolone cream (KENALOG) 0.1 % APPLY TOPICALLY DAILY AS NEEDED 454 g 0  . vitamin B-12 (CYANOCOBALAMIN) 1000 MCG tablet Take 1 tablet (1,000 mcg total) by mouth daily. 90 tablet 1  . furosemide (LASIX) 20 MG tablet Take 1 tablet (20 mg total) by mouth daily as needed. (Patient  not taking: Reported on 10/28/2019) 90 tablet 3  . predniSONE (DELTASONE) 10 MG tablet TAKE 2 TABLETS BY MOUTH ONCE DAILY AS NEEDED WITH FOOD FOR GOUT (Patient not taking: Reported on 10/28/2019) 20 tablet 0  . tiotropium (SPIRIVA HANDIHALER) 18 MCG inhalation capsule Place 1 capsule (18 mcg total) into inhaler and inhale daily. (Patient not taking: Reported on 10/28/2019) 30 capsule 12   No current facility-administered medications for this visit.     PHYSICAL EXAMINATION: ECOG PERFORMANCE STATUS: 2 - Symptomatic, <50% confined to bed Vitals:   10/28/19 0948  BP: (!) 116/55  Pulse: 64  Temp: 97.6 F (36.4 C)  SpO2: 99%   Filed Weights   10/28/19 0948  Weight: 164 lb (74.4 kg)    Physical Exam Constitutional:      General: He is not in acute distress.    Appearance: He is not ill-appearing.     Comments: Sitting in wheelchair.  HENT:     Head: Normocephalic and atraumatic.  Eyes:     General: No scleral icterus.    Pupils: Pupils are equal, round, and reactive to light.  Cardiovascular:     Rate and Rhythm: Normal rate and regular rhythm.     Heart sounds: Normal heart sounds.  Pulmonary:     Effort: Pulmonary effort is normal. No respiratory distress.     Breath sounds: No wheezing.     Comments: Decreased breath sounds bilaterally. Abdominal:      General: Bowel sounds are normal. There is no distension.     Palpations: Abdomen is soft. There is no mass.     Tenderness: There is no abdominal tenderness.  Musculoskeletal:        General: No deformity. Normal range of motion.     Cervical back: Normal range of motion and neck supple.     Comments: Left wrist 1cm erythematous nodule, tender with palpation.   Skin:    General: Skin is warm and dry.     Findings: No erythema or rash.  Neurological:     Mental Status: He is alert and oriented to person, place, and time. Mental status is at baseline.     Cranial Nerves: No cranial nerve deficit.     Coordination: Coordination normal.  Psychiatric:        Mood and Affect: Mood normal.       LABORATORY DATA:  I have reviewed the data as listed Lab Results  Component Value Date   WBC 6.0 10/26/2019   HGB 10.3 (L) 10/26/2019   HCT 29.3 (L) 10/26/2019   MCV 94.2 10/26/2019   PLT 149 (L) 10/26/2019   Recent Labs    12/18/18 1215 07/16/19 1109 10/26/19 1308  NA 131* 126* 130*  K 4.4 4.7 4.6  CL 98 94* 99  CO2 23 22 22   GLUCOSE 96 82 122*  BUN 8 8 12   CREATININE 1.30* 1.18 1.37*  CALCIUM 8.5* 8.1* 8.5*  GFRNONAA 54* 60 50*  GFRAA >60 70 58*  PROT 6.1* 5.7* 6.5  ALBUMIN 3.6 3.6* 3.8  AST 49* 21 26  ALT 24 11 17   ALKPHOS 119 90 88  BILITOT 1.5* 0.9 1.0  BILIDIR  --  0.31  --    Iron/TIBC/Ferritin/ %Sat    Component Value Date/Time   IRON 91 10/26/2019 1308   TIBC 301 10/26/2019 1308   FERRITIN 195 10/26/2019 1308   IRONPCTSAT 30 10/26/2019 1308      RADIOGRAPHIC STUDIES: I have personally reviewed the radiological images  as listed and agreed with the findings in the report. ECHOCARDIOGRAM COMPLETE  Result Date: 08/13/2019    ECHOCARDIOGRAM REPORT   Patient Name:   Devin Ramsey Date of Exam: 08/13/2019 Medical Rec #:  [id number]      Height:       70.0 in Accession #:    [id number]     Weight:       166.2 lb Date of Birth:  Jun 13, 1945      BSA:          1.930  m Patient Age:    73 years       BP:           103/54 mmHg Patient Gender: M              HR:           66 bpm. Exam Location:  [facility] Procedure: 2D Echo, Cardiac Doppler and Color Doppler Indications:    R06.02 SOB,; R06.9 DOE  History:        Patient has prior history of Echocardiogram examinations, most                 recent 05/29/2018. Cardiomyopathy and CHF, COPD,                 Arrythmias:Atrial Fibrillation, LBBB and PVC's,                 Signs/Symptoms:Shortness of Breath and Dyspnea; Risk                 Factors:Hypertension, Dyslipidemia and Former Smoker. Patient                 states he has no problems. He feels cold all the time taking                 Eliquis.  Sonographer:    Carlos American RVT, RDCS (AE), RDMS Referring Phys: [phone] Lennon Alstrom  Sonographer Comments: Suboptimal parasternal window and suboptimal subcostal window. Image acquisition challenging due to COPD and Image acquisition challenging due to patient body habitus. Limited SSN images. IMPRESSIONS  1. Left ventricular ejection fraction, by estimation, is 35 to 40%. The left ventricle has moderately decreased function. The left ventricle demonstrates global hypokinesis. Left ventricular diastolic parameters are indeterminate.  2. Right ventricular systolic function is mildly reduced. The right ventricular size is mildly enlarged. There is mildly elevated pulmonary artery systolic pressure. The estimated right ventricular systolic pressure is 39.2 mmHg.  3. Left atrial size was severely dilated.  4. Moderate mitral valve regurgitation.  5. Aortic valve regurgitation is mild to moderate. Comparison(s): EF 35-40%. FINDINGS  Left Ventricle: Left ventricular ejection fraction, by estimation, is 35 to 40%. The left ventricle has moderately decreased function. The left ventricle demonstrates global hypokinesis. The left ventricular internal cavity size was normal in size. There is no left ventricular hypertrophy. Left  ventricular diastolic parameters are indeterminate. Right Ventricle: The right ventricular size is mildly enlarged. No increase in right ventricular wall thickness. Right ventricular systolic function is mildly reduced. There is mildly elevated pulmonary artery systolic pressure. The tricuspid regurgitant  velocity is 2.70 m/s, and with an assumed right atrial pressure of 10 mmHg, the estimated right ventricular systolic pressure is 39.2 mmHg. Left Atrium: Left atrial size was severely dilated. Right Atrium: Right atrial size was normal in size. Pericardium: There is no evidence of pericardial effusion. Mitral Valve: The mitral valve is normal in structure. Normal mobility of  the mitral valve leaflets. Moderate mitral valve regurgitation. No evidence of mitral valve stenosis. Tricuspid Valve: The tricuspid valve is normal in structure. Tricuspid valve regurgitation is mild . No evidence of tricuspid stenosis. Aortic Valve: The aortic valve is normal in structure. Aortic valve regurgitation is mild to moderate. Aortic regurgitation PHT measures 525 msec. Mild to moderate aortic valve sclerosis/calcification is present, without any evidence of aortic stenosis. Pulmonic Valve: The pulmonic valve was normal in structure. Pulmonic valve regurgitation is not visualized. No evidence of pulmonic stenosis. Aorta: The aortic root is normal in size and structure. Venous: The inferior vena cava is normal in size with greater than 50% respiratory variability, suggesting right atrial pressure of 3 mmHg. IAS/Shunts: No atrial level shunt detected by color flow Doppler.  LEFT VENTRICLE PLAX 2D LVIDd:         5.37 cm  Diastology LVIDs:         4.31 cm  LV e' lateral:   17.40 cm/s LV PW:         1.02 cm  LV E/e' lateral: 4.8 LV IVS:        0.99 cm  LV e' medial:    6.02 cm/s LVOT diam:     2.00 cm  LV E/e' medial:  13.9 LV SV:         38 LV SV Index:   20 LVOT Area:     3.14 cm  LEFT ATRIUM             Index LA diam:        4.70 cm  2.44 cm/m LA Vol (A2C):           67.90 ml/m LA Vol (A4C):           53.90 ml/m LA Biplane Vol:         63.20 ml/m  AORTIC VALVE             PULMONIC VALVE LVOT Vmax:   63.40 cm/s  PV Vmax:       0.67 m/s LVOT Vmean:  38.800 cm/s PV Peak grad:  1.8 mmHg LVOT VTI:    0.121 m AI PHT:      525 msec  AORTA Ao Root diam: 3.30 cm MITRAL VALVE               TRICUSPID VALVE MV Area (PHT): 5.38 cm    TV Peak grad:   23.8 mmHg MV Decel Time: 141 msec    TV Vmax:        2.44 m/s MV E velocity: 83.90 cm/s  TR Peak grad:   29.2 mmHg                            TR Vmax:        270.00 cm/s                             SHUNTS                            Systemic VTI:  0.12 m                            Systemic Diam: 2.00 cm Julien Nordmann MD Electronically signed by Julien Nordmann MD Signature Date/Time: 08/13/2019/9:05:43 PM    Final  ASSESSMENT & PLAN:  1. Anemia, unspecified type   2. Left wrist pain   3. B12 deficiency   4. Hemochromatosis carrier   5. Chronic alcohol use    #Labs reviewed and discussed with patient Chronic hyponatremia, sodium level 130 stable. Hemochromatosis carrier, iron panel was reviewed and discussed with patient.  Ferritin of 01/18/1994, iron saturation 30. No need for phlebotomy at this point. Discussed with him about alcohol cessation.  Left wrist tenderness, possible gout flare.  Advised patient to discuss with primary care provider.  He has prescription of prednisone as needed for gout flares.  I will check his uric acid level.  Chronic anemia, MCV 94.2, hemoglobin is at 10.3. Vitamin B12 is borderline low.  He has been given vitamin B12 supplementation which he is not taking.  We discussed about vitamin B 12 IM injections monthly and he agrees.  I will arrange. Chronic thrombocytopenia, mild.  Stable.  Orders Placed This Encounter  Procedures  . Uric acid    Standing Status:   Future    Number of Occurrences:   1    Standing Expiration Date:   10/27/2020    All  questions were answered. The patient knows to call the clinic with any problems questions or concerns.  cc Tonia Ghent, MD    Return of visit: 6 months  Earlie Server, MD, PhD Hematology Oncology [place] [facility] 10/28/2019

## 2019-10-28 NOTE — Progress Notes (Signed)
Patient here for follow up. No new concerns voiced.  °

## 2019-10-31 ENCOUNTER — Telehealth: Payer: Self-pay | Admitting: Family Medicine

## 2019-10-31 NOTE — Telephone Encounter (Signed)
Please notify patient.  I saw his TSH results at 6.8.  This is still abnormal but closer to normal than it had been on previous check.  I am hesitant to change his medications at this point, since his level is close to normal.  I would continue as is.  Please update me as needed.  Thanks.

## 2019-11-01 NOTE — Telephone Encounter (Signed)
Patient advised.

## 2019-11-11 ENCOUNTER — Telehealth: Payer: Self-pay

## 2019-11-11 ENCOUNTER — Ambulatory Visit (INDEPENDENT_AMBULATORY_CARE_PROVIDER_SITE_OTHER): Payer: Medicare Other | Admitting: Family Medicine

## 2019-11-11 ENCOUNTER — Encounter: Payer: Self-pay | Admitting: Family Medicine

## 2019-11-11 ENCOUNTER — Ambulatory Visit: Payer: Medicare Other

## 2019-11-11 ENCOUNTER — Other Ambulatory Visit: Payer: Self-pay

## 2019-11-11 DIAGNOSIS — L989 Disorder of the skin and subcutaneous tissue, unspecified: Secondary | ICD-10-CM | POA: Diagnosis not present

## 2019-11-11 DIAGNOSIS — M109 Gout, unspecified: Secondary | ICD-10-CM | POA: Diagnosis not present

## 2019-11-11 DIAGNOSIS — E039 Hypothyroidism, unspecified: Secondary | ICD-10-CM | POA: Diagnosis not present

## 2019-11-11 MED ORDER — DOXYCYCLINE HYCLATE 100 MG PO TABS: 100.0000 mg | ORAL_TABLET | Freq: Two times a day (BID) | ORAL | 0 refills | Status: DC

## 2019-11-11 NOTE — Telephone Encounter (Signed)
Called patient 3 times to complete his Medicare visit and patient never answered. Voicemail was not available. Appointment was cancelled.

## 2019-11-11 NOTE — Patient Instructions (Signed)
The smaller spot looks like a benign cyst.  You shouldn't have to do anything about that.   The other lesion may be infected and it likely makes sense to use warm compresses a few times a day and start doxycycline.   If that doesn't help then let me know.  Take care.  Glad to see you.

## 2019-11-11 NOTE — Progress Notes (Signed)
This visit occurred during the SARS-CoV-2 public health emergency.  Safety protocols were in place, including screening questions prior to the visit, additional usage of staff PPE, and extensive cleaning of exam room while observing appropriate contact time as indicated for disinfecting solutions.  "Wrist lumps".  L wrist, flexor side, ttp.  Noted a few months ago.   1 lesion is 1cm across, the other is 46mm.    BP and pulse are similar to prev.  Still on baseline meds below.  He still gets fatigued with walking.  Rare use of furosemide, no recent use.  Recent mild inc in TSH d/w pt.   Rare use of prednisone.    Current Outpatient Medications on File Prior to Visit  Medication Sig Dispense Refill  . clobetasol cream (TEMOVATE) 0.05 % Apply 1 application topically 2 (two) times daily as needed. Use the least amount possible. 45 g 2  . ELIQUIS 5 MG TABS tablet Take 1 tablet by mouth twice daily 180 tablet 2  . ENTRESTO 24-26 MG Take 1 tablet by mouth twice daily 180 tablet 0  . furosemide (LASIX) 20 MG tablet Take 1 tablet (20 mg total) by mouth daily as needed. 90 tablet 3  . hydrOXYzine (ATARAX/VISTARIL) 10 MG tablet TAKE 1/2 TO 1 (ONE-HALF TO ONE) TABLET BY MOUTH EVERY 8 HOURS AS NEEDED FOR ITCHING 30 tablet 2  . levothyroxine (SYNTHROID) 75 MCG tablet Take 1 tablet (75 mcg total) by mouth daily. 90 tablet 3  . predniSONE (DELTASONE) 10 MG tablet TAKE 2 TABLETS BY MOUTH ONCE DAILY AS NEEDED WITH FOOD FOR GOUT 20 tablet 0  . rosuvastatin (CRESTOR) 10 MG tablet Take 1 tablet by mouth once daily 90 tablet 0  . tiotropium (SPIRIVA HANDIHALER) 18 MCG inhalation capsule Place 1 capsule (18 mcg total) into inhaler and inhale daily. 30 capsule 12  . traMADol (ULTRAM) 50 MG tablet TAKE 2 TABLETS BY MOUTH EVERY 12 HOURS AS NEEDED FOR  KNEE  PAIN 120 tablet 1  . triamcinolone cream (KENALOG) 0.1 % APPLY TOPICALLY DAILY AS NEEDED 454 g 0  . vitamin B-12 (CYANOCOBALAMIN) 1000 MCG tablet Take 1 tablet (1,000  mcg total) by mouth daily. 90 tablet 1   No current facility-administered medications on file prior to visit.   covid vaccine d/w pt.  Encouraged.    He is still caring for his wife who had memory loss.  D/w pt.    Meds, vitals, and allergies reviewed.   ROS: Per HPI unless specifically indicated in ROS section   GEN: nad, alert and oriented HEENT: ncat NECK: supple w/o LA CV: rrr.  PULM: ctab, no inc wob ABD: soft, +bs He has 2 lesions on the left wrist, on the flexor side. 1 lesion is 1cm across and it appears to have a few small associated pustules, the other appears to be a benign cyst that is 28mm.

## 2019-11-13 NOTE — Progress Notes (Signed)
Cardiology Office Note  Date:  11/15/2019   ID:  Devin Ramsey, Devin Ramsey 08-Jan-1946, MRN [id number]  PCP:  [name], MD   Chief Complaint  Patient presents with  . Other    1 month follow up. Meds reviewed verbally with patient.     HPI:  Mr. Devin Ramsey is a 74 year old gentleman with past medical history of Moderate diffuse aortic atherosclerosis on CT 2016 hospital 06/2014 with bronchitis symptoms,  Permanent atrial fibrillation,  chest x-ray showing pneumonia who was kept in the hospital for 5 days,  COPD exacerbation.  cardioversion 08/23/2014 for A. fib flutter,  Previous echocardiogram showed ejection fraction 35-40%, February 2016 in the setting of atrial fibrillation Stopped smoking 06/28/2014, wife still smokes He has previously declined ischemia workup He follows up today to discuss atrial fibrillation management  Recently seen by Dr. Graciela Husbands consideration of CRT-P implantation for bradycardia in the setting of left bundle branch block. Beta-blocker held with improvement of his bradycardia No device indicated Code he was at his baseline  Not eating as much Down 20 pounds in 2 years Was 170 in 05/2018 Weight 180 in 2019 Did not eat much breakfast today  BP low today, no orthostasis symptoms Reports blood pressure at home typically 1 10-1 20 systolic Legs giving out with walking, for years, stable Presents today in a wheelchair  no leg edema, not taking Lasix  No regular exercise program Continues to have knee pain and difficulty walking Used to lay bricks  x-ray of left knee shows moderate degenerative change   Wife with alzheimers, "asks the same thing over and over" Stressful  EKG personally reviewed by myself on todays visit showing atrial flutter, rate 73  bpm,LBBB  Other past medical history EKG in January 2017 showing atrial fibrillation Prior EKG in October and November 2016 shows normal sinus rhythm with left bundle branch block  previous  Ejection fraction remains low at 35-40%, no significant improvement by restoring normal sinus rhythm  Echocardiogram from 06/29/2014 showing ejection fraction 35-40%, septal motion consistent with bundle branch block, normal right ventricular systolic pressure, normal left atrial size   PMH:   has a past medical history of Anemia, B12 deficiency (10/28/2019), Bilateral pleural effusion, Chronic systolic CHF (congestive heart failure) (HCC), COPD (chronic obstructive pulmonary disease) (HCC), Essential hypertension, Gout, HLD (hyperlipidemia), Hypothyroidism, LBBB (left bundle branch block), Permanent atrial fibrillation (HCC), Pneumonia (2016), and Tobacco abuse.  PSH:    Past Surgical History:  Procedure Laterality Date  . HEMORRHOID SURGERY    . SHOULDER SURGERY     right shoulder   . TONSILLECTOMY      Current Outpatient Medications  Medication Sig Dispense Refill  . clobetasol cream (TEMOVATE) 0.05 % Apply 1 application topically 2 (two) times daily as needed. Use the least amount possible. 45 g 2  . ELIQUIS 5 MG TABS tablet Take 1 tablet by mouth twice daily 180 tablet 2  . ENTRESTO 24-26 MG Take 1 tablet by mouth twice daily 180 tablet 0  . furosemide (LASIX) 20 MG tablet Take 1 tablet (20 mg total) by mouth daily as needed. 90 tablet 3  . hydrOXYzine (ATARAX/VISTARIL) 10 MG tablet TAKE 1/2 TO 1 (ONE-HALF TO ONE) TABLET BY MOUTH EVERY 8 HOURS AS NEEDED FOR ITCHING 30 tablet 2  . levothyroxine (SYNTHROID) 75 MCG tablet Take 1 tablet (75 mcg total) by mouth daily. 90 tablet 3  . predniSONE (DELTASONE) 10 MG tablet TAKE 2 TABLETS BY MOUTH ONCE DAILY AS NEEDED WITH  FOOD FOR GOUT 20 tablet 0  . rosuvastatin (CRESTOR) 10 MG tablet Take 1 tablet by mouth once daily 90 tablet 0  . tiotropium (SPIRIVA HANDIHALER) 18 MCG inhalation capsule Place 1 capsule (18 mcg total) into inhaler and inhale daily. 30 capsule 12  . traMADol (ULTRAM) 50 MG tablet TAKE 2 TABLETS BY MOUTH EVERY 12 HOURS AS  NEEDED FOR  KNEE  PAIN 120 tablet 1  . triamcinolone cream (KENALOG) 0.1 % APPLY TOPICALLY DAILY AS NEEDED 454 g 0  . vitamin B-12 (CYANOCOBALAMIN) 1000 MCG tablet Take 1 tablet (1,000 mcg total) by mouth daily. 90 tablet 1   No current facility-administered medications for this visit.     Allergies:   Codeine and Indocin [indomethacin]   Social History:  The patient  reports that he quit smoking about 5 years ago. His smoking use included cigarettes. He quit after 55.00 years of use. He has never used smokeless tobacco. He reports current alcohol use of about 15.0 standard drinks of alcohol per week. He reports that he does not use drugs.   Family History:   He was adopted. Family history is unknown by patient.    Review of Systems: Review of Systems  Constitutional: Negative.   Respiratory: Positive for shortness of breath.   Cardiovascular: Negative.   Gastrointestinal: Negative.   Musculoskeletal: Positive for joint pain.  Neurological: Negative.   Psychiatric/Behavioral: Negative.   All other systems reviewed and are negative.    PHYSICAL EXAM: VS:  BP (!) 77/47 (BP Location: Right Arm, Patient Position: Sitting, Cuff Size: Normal)   Pulse 73   Ht 5\' 10"  (1.778 m)   Wt 161 lb (73 kg)   BMI 23.10 kg/m  , BMI Body mass index is 23.1 kg/m. Constitutional:  oriented to person, place, and time. No distress.  HENT:  Head: Grossly normal Eyes:  no discharge. No scleral icterus.  Neck: No JVD, no carotid bruits  Cardiovascular: Regular rate and rhythm, no murmurs appreciated Pulmonary/Chest: Mildly decreased breath sounds throughout no wheezes or rales Abdominal: Soft.  no distension.  no tenderness.  Musculoskeletal: Normal range of motion Neurological:  normal muscle tone. Coordination normal. No atrophy Skin: Skin warm and dry Psychiatric: normal affect, pleasant   Recent Labs: 11/16/2018: Pro B Natriuretic peptide (BNP) 901.0 07/16/2019: Magnesium 2.0 10/26/2019: ALT  17; BUN 12; Creatinine, Ser 1.37; Hemoglobin 10.3; Platelets 149; Potassium 4.6; Sodium 130; TSH 6.853    Lipid Panel Lab Results  Component Value Date   CHOL 102 07/16/2019   HDL 60 07/16/2019   LDLCALC 30 07/16/2019   TRIG 48 07/16/2019      Wt Readings from Last 3 Encounters:  11/15/19 161 lb (73 kg)  11/11/19 161 lb 9 oz (73.3 kg)  10/28/19 164 lb (74.4 kg)       ASSESSMENT AND PLAN:  Congestive dilated cardiomyopathy (HCC) - Long history of low blood pressure, no medication changes made Blood pressure low today, 90 systolic  appears euvolemic, not taking Lasix Lower blood pressure could be exacerbated by weight loss over the past 2 years Recommend he try not to miss any meals We will continue to monitor blood pressure at home and call our office if it runs consistently less than 100 systolic  rash Previously treated with prednisone  Chronic systolic CHF (congestive heart failure) (HCC) -  Peers euvolemic, not on Lasix Weight stable to trending down likely dietary  Hypotension Long discussion with him today on whether to cut back on his  Entresto No changes made, will monitor his home blood pressure numbers  Permanent atrial fibrillation (HCC) -  Rate controlled on anticoagulation no changes made, stable  Hyperlipidemia Cholesterol is at goal on the current lipid regimen. No changes to the medications were made.  Severe protein calorie malnutrition Discussed diet with him, he is down 20 pounds in 2 years Blood pressure running on the low end Discussed need to not miss any meals   Total encounter time more than 25 minutes  Greater than 50% was spent in counseling and coordination of care with the patient   Disposition:   F/U  6 months   Orders Placed This Encounter  Procedures  . EKG 12-Lead     Signed, Esmond Plants, M.D., Ph.D. 11/15/2019  [place], [place]

## 2019-11-14 NOTE — Assessment & Plan Note (Signed)
Rare use of prednisone.  He will update me as needed.  Routine steroid cautions discussed with patient.

## 2019-11-14 NOTE — Assessment & Plan Note (Signed)
We opted not to change his medication because I do not want to overcorrect his TSH.  He agrees.

## 2019-11-14 NOTE — Assessment & Plan Note (Signed)
The smaller spot looks like a benign cyst.  He shouldn't have to do anything about that.   The other lesion may be infected and it likely makes sense to use warm compresses a few times a day and start doxycycline.   If that doesn't help then he will let me know.

## 2019-11-15 ENCOUNTER — Other Ambulatory Visit: Payer: Self-pay

## 2019-11-15 ENCOUNTER — Ambulatory Visit (INDEPENDENT_AMBULATORY_CARE_PROVIDER_SITE_OTHER): Payer: Medicare Other | Admitting: Cardiovascular Disease

## 2019-11-15 ENCOUNTER — Encounter: Payer: Self-pay | Admitting: Cardiovascular Disease

## 2019-11-15 VITALS — BP 77/47 | HR 73 | Ht 70.0 in | Wt 161.0 lb

## 2019-11-15 DIAGNOSIS — R001 Bradycardia, unspecified: Secondary | ICD-10-CM | POA: Diagnosis not present

## 2019-11-15 DIAGNOSIS — I4892 Unspecified atrial flutter: Secondary | ICD-10-CM | POA: Diagnosis not present

## 2019-11-15 DIAGNOSIS — I4821 Permanent atrial fibrillation: Secondary | ICD-10-CM | POA: Diagnosis not present

## 2019-11-15 DIAGNOSIS — I10 Essential (primary) hypertension: Secondary | ICD-10-CM

## 2019-11-15 DIAGNOSIS — R0600 Dyspnea, unspecified: Secondary | ICD-10-CM

## 2019-11-15 DIAGNOSIS — Z87891 Personal history of nicotine dependence: Secondary | ICD-10-CM

## 2019-11-15 DIAGNOSIS — E43 Unspecified severe protein-calorie malnutrition: Secondary | ICD-10-CM

## 2019-11-15 DIAGNOSIS — J449 Chronic obstructive pulmonary disease, unspecified: Secondary | ICD-10-CM

## 2019-11-15 DIAGNOSIS — I420 Dilated cardiomyopathy: Secondary | ICD-10-CM

## 2019-11-15 DIAGNOSIS — E785 Hyperlipidemia, unspecified: Secondary | ICD-10-CM

## 2019-11-15 NOTE — Patient Instructions (Addendum)

## 2019-11-17 ENCOUNTER — Telehealth: Payer: Self-pay | Admitting: Family Medicine

## 2019-11-17 NOTE — Telephone Encounter (Signed)
Patient called today requesting a prescription for cramps. He stated with the hot weather he always gets cramps in both of his hands  Patient wanted to know if there is anything that could be prescribed to help with this

## 2019-11-17 NOTE — Telephone Encounter (Signed)
Quinine used to be commonly prescribed for cramps but I would avoid that given his other medical problems.  The main treatment is to take extra water.  Make sure he is drinking enough water to keep his urine clear and see if that helps with the frequency of cramping.  If that does not help then let me know.  Thanks.

## 2019-11-18 NOTE — Telephone Encounter (Signed)
Patient advised.

## 2019-11-25 ENCOUNTER — Other Ambulatory Visit: Payer: Self-pay

## 2019-11-25 ENCOUNTER — Inpatient Hospital Stay: Payer: Medicare Other | Attending: Oncology

## 2019-11-25 DIAGNOSIS — D649 Anemia, unspecified: Secondary | ICD-10-CM | POA: Insufficient documentation

## 2019-11-25 DIAGNOSIS — E538 Deficiency of other specified B group vitamins: Secondary | ICD-10-CM | POA: Insufficient documentation

## 2019-11-25 MED ORDER — CYANOCOBALAMIN 1000 MCG/ML IJ SOLN
1000.0000 ug | Freq: Once | INTRAMUSCULAR | Status: AC
  Administered 2019-11-25: 1000 ug via INTRAMUSCULAR
  Filled 2019-11-25: qty 1

## 2019-12-20 ENCOUNTER — Other Ambulatory Visit: Payer: Self-pay | Admitting: Family Medicine

## 2019-12-20 NOTE — Telephone Encounter (Signed)
Electronic refill request. Tramadol Last office visit:   11/11/2019 Last Filled:    120 tablet 1 08/25/2019  Please advise.

## 2019-12-21 NOTE — Telephone Encounter (Signed)
Sent. Thanks.   

## 2019-12-23 ENCOUNTER — Inpatient Hospital Stay: Payer: Medicare Other | Attending: Oncology

## 2019-12-23 ENCOUNTER — Other Ambulatory Visit: Payer: Self-pay

## 2019-12-23 DIAGNOSIS — E871 Hypo-osmolality and hyponatremia: Secondary | ICD-10-CM | POA: Diagnosis not present

## 2019-12-23 DIAGNOSIS — E538 Deficiency of other specified B group vitamins: Secondary | ICD-10-CM

## 2019-12-23 DIAGNOSIS — M25532 Pain in left wrist: Secondary | ICD-10-CM | POA: Insufficient documentation

## 2019-12-23 DIAGNOSIS — D649 Anemia, unspecified: Secondary | ICD-10-CM | POA: Insufficient documentation

## 2019-12-23 DIAGNOSIS — Z7289 Other problems related to lifestyle: Secondary | ICD-10-CM | POA: Insufficient documentation

## 2019-12-23 DIAGNOSIS — Z87891 Personal history of nicotine dependence: Secondary | ICD-10-CM | POA: Insufficient documentation

## 2019-12-23 DIAGNOSIS — D696 Thrombocytopenia, unspecified: Secondary | ICD-10-CM | POA: Insufficient documentation

## 2019-12-23 DIAGNOSIS — Z79899 Other long term (current) drug therapy: Secondary | ICD-10-CM | POA: Insufficient documentation

## 2019-12-23 MED ORDER — CYANOCOBALAMIN 1000 MCG/ML IJ SOLN
1000.0000 ug | Freq: Once | INTRAMUSCULAR | Status: AC
  Administered 2019-12-23: 1000 ug via INTRAMUSCULAR
  Filled 2019-12-23: qty 1

## 2020-01-10 ENCOUNTER — Other Ambulatory Visit: Payer: Self-pay | Admitting: Cardiovascular Disease

## 2020-01-14 ENCOUNTER — Other Ambulatory Visit: Payer: Self-pay | Admitting: Family Medicine

## 2020-01-14 NOTE — Telephone Encounter (Signed)
Electronic refill request. Prednisone Last office visit:   11/11/2019 Last Filled:    20 tablet 0 10/22/2019

## 2020-01-14 NOTE — Telephone Encounter (Signed)
Sent. Thanks.   

## 2020-01-21 ENCOUNTER — Other Ambulatory Visit: Payer: Self-pay | Admitting: Family Medicine

## 2020-01-21 NOTE — Telephone Encounter (Signed)
Electronic refill request. Hydroxyzine Last office visit:   11/11/2019 Last Filled:    30 tablet 2 09/26/2019

## 2020-01-24 ENCOUNTER — Other Ambulatory Visit: Payer: Self-pay | Admitting: Cardiovascular Disease

## 2020-01-24 NOTE — Telephone Encounter (Signed)
Sent. Thanks.   

## 2020-01-27 ENCOUNTER — Other Ambulatory Visit: Payer: Self-pay

## 2020-01-27 ENCOUNTER — Inpatient Hospital Stay: Payer: Medicare Other | Attending: Oncology

## 2020-01-27 DIAGNOSIS — D649 Anemia, unspecified: Secondary | ICD-10-CM | POA: Insufficient documentation

## 2020-01-27 DIAGNOSIS — I509 Heart failure, unspecified: Secondary | ICD-10-CM | POA: Insufficient documentation

## 2020-01-27 DIAGNOSIS — I10 Essential (primary) hypertension: Secondary | ICD-10-CM | POA: Diagnosis not present

## 2020-01-27 DIAGNOSIS — Z87891 Personal history of nicotine dependence: Secondary | ICD-10-CM | POA: Insufficient documentation

## 2020-01-27 DIAGNOSIS — E785 Hyperlipidemia, unspecified: Secondary | ICD-10-CM | POA: Diagnosis not present

## 2020-01-27 DIAGNOSIS — E039 Hypothyroidism, unspecified: Secondary | ICD-10-CM | POA: Diagnosis not present

## 2020-01-27 DIAGNOSIS — M25532 Pain in left wrist: Secondary | ICD-10-CM | POA: Insufficient documentation

## 2020-01-27 DIAGNOSIS — Z7289 Other problems related to lifestyle: Secondary | ICD-10-CM | POA: Diagnosis not present

## 2020-01-27 DIAGNOSIS — E538 Deficiency of other specified B group vitamins: Secondary | ICD-10-CM | POA: Insufficient documentation

## 2020-01-27 DIAGNOSIS — Z79899 Other long term (current) drug therapy: Secondary | ICD-10-CM | POA: Insufficient documentation

## 2020-01-27 DIAGNOSIS — J449 Chronic obstructive pulmonary disease, unspecified: Secondary | ICD-10-CM | POA: Diagnosis not present

## 2020-01-27 MED ORDER — CYANOCOBALAMIN 1000 MCG/ML IJ SOLN
1000.0000 ug | Freq: Once | INTRAMUSCULAR | Status: AC
  Administered 2020-01-27: 1000 ug via INTRAMUSCULAR
  Filled 2020-01-27: qty 1

## 2020-02-15 ENCOUNTER — Telehealth: Payer: Self-pay | Admitting: Cardiovascular Disease

## 2020-02-15 DIAGNOSIS — I4821 Permanent atrial fibrillation: Secondary | ICD-10-CM

## 2020-02-15 NOTE — Telephone Encounter (Signed)
Patient calling  Wants to know if Dr [name] can send in a prescription  Does not remember name of medication but says it is for cramps and he has sent it in before  Please call to discuss

## 2020-02-15 NOTE — Telephone Encounter (Signed)
Spoke with patient and he needs refill on medication that helps with the cramps. He doesn't know the name but they are big and hard to swallow. Reviewing his medications and I think he is referring to the potassium pills. Let him know that I would send over to Dr. [name] for his review and would give him a call back tomorrow. He has 2 appointments tomorrow one with eye doctor and another one in [place] so he wants me to call back after lunch. He verbalized understanding of our conversation, agreement with plan, and had no further questions at this time.

## 2020-02-16 NOTE — Telephone Encounter (Signed)
Not sure what is causing his cramps Last office visit was not taking much Lasix and his potassium was above 4 so should not need potassium supplement Of concern his blood pressure was very low at that time and lab work several months ago looked mildly dehydrated We need may be a new BMP Also needs some blood pressure measurements to make sure we do not need to cut back on his medications

## 2020-02-16 NOTE — Telephone Encounter (Signed)
Spoke with patient and reviewed provider recommendations to have labs done and monitor blood pressures. Instructed him to go over to [facility] and no appointment needed just let them know he is there for some labs. Also reviewed that we would like him to monitor for blood pressures and to check them 2 hours after medications so that we can see how they are doing as well. Once we get the labs and blood pressures then Dr. [name] can review and determine what we should do next. He verbalized understanding of recommendations, was agreeable with plan, and had no further questions at this time. Order entered for [facility] and he will call back on Monday or Tuesday to give those readings to [place]. He was appreciative for the call.

## 2020-02-24 ENCOUNTER — Inpatient Hospital Stay: Payer: Medicare Other | Attending: Oncology

## 2020-02-24 ENCOUNTER — Other Ambulatory Visit: Payer: Self-pay

## 2020-02-24 DIAGNOSIS — E538 Deficiency of other specified B group vitamins: Secondary | ICD-10-CM | POA: Insufficient documentation

## 2020-02-24 DIAGNOSIS — D649 Anemia, unspecified: Secondary | ICD-10-CM | POA: Diagnosis not present

## 2020-02-24 MED ORDER — CYANOCOBALAMIN 1000 MCG/ML IJ SOLN
1000.0000 ug | Freq: Once | INTRAMUSCULAR | Status: AC
  Administered 2020-02-24: 1000 ug via INTRAMUSCULAR
  Filled 2020-02-24: qty 1

## 2020-03-18 ENCOUNTER — Other Ambulatory Visit: Payer: Self-pay

## 2020-03-18 ENCOUNTER — Ambulatory Visit (INDEPENDENT_AMBULATORY_CARE_PROVIDER_SITE_OTHER): Payer: Medicare Other

## 2020-03-18 DIAGNOSIS — Z23 Encounter for immunization: Secondary | ICD-10-CM | POA: Diagnosis not present

## 2020-03-23 NOTE — Telephone Encounter (Signed)
Pt called checking on wheelchair  Best number  [phone]

## 2020-03-24 ENCOUNTER — Telehealth: Payer: Self-pay

## 2020-03-24 DIAGNOSIS — M25569 Pain in unspecified knee: Secondary | ICD-10-CM

## 2020-03-24 DIAGNOSIS — R269 Unspecified abnormalities of gait and mobility: Secondary | ICD-10-CM

## 2020-03-24 NOTE — Telephone Encounter (Signed)
See prev note and let me know what the supplier has to offer.  I thought this was addressed prev.  Thanks.

## 2020-03-24 NOTE — Telephone Encounter (Signed)
Called patient to follow up on his request for a wheelchair. Patient requested order for wheelchair six months prior, which Dr. Para March had done per chart. Patient stated that he never received will chair. Instructed patient that Dr. Para March will be notified of this information and that patient will be notified next week. Patient appreciative and had no further questions.

## 2020-03-27 ENCOUNTER — Inpatient Hospital Stay: Payer: Medicare Other | Attending: Oncology

## 2020-03-27 ENCOUNTER — Other Ambulatory Visit: Payer: Self-pay

## 2020-03-27 DIAGNOSIS — E538 Deficiency of other specified B group vitamins: Secondary | ICD-10-CM | POA: Diagnosis not present

## 2020-03-27 MED ORDER — CYANOCOBALAMIN 1000 MCG/ML IJ SOLN
1000.0000 ug | Freq: Once | INTRAMUSCULAR | Status: AC
  Administered 2020-03-27: 1000 ug via INTRAMUSCULAR
  Filled 2020-03-27: qty 1

## 2020-03-27 NOTE — Telephone Encounter (Signed)
Called and spoke with [name], now in [employer] position. She stated that another referral would need to be placed for DME (Light Weight Wheelchair). [name] stated that once she received this, she would be able to assist with this.    Community Resource Referral   [facility] 09/20/2019  Name: Devin Ramsey  MRN: [id number]  DOB: 10/06/1945  AGE: 74 y.o.  GENDER: male  PCP [name], MD.   Called pt regarding [employer] Referral for lightweight wheelchair. Pt stated that he would like an electric wheelchair if possible.  Dr. Para March, could you please send prescription for wheelchair to [facility] (accepts Medicaid and Med Adv) [email or web address] [phone]       Fax: [phone] or email to [email or web address]  or another preferred DME provider that accepts medicaid, it should be fully covered.  Could you please follow up with the pt to let them know the information.  [name]  Care Guide . Embedded Care Coordination [facility] Management [name] [phone]

## 2020-03-28 NOTE — Telephone Encounter (Signed)
Referral placed.  Please send rx for lightweight wheelchair to [facility] (accepts Medicaid and Med Adv)[email or web address] [phone]  Fax: [phone] or email to [email or web address]  Dx   M25.569 (ICD-10-CM) - Knee pain  R26.9 (ICD-10-CM) - Gait disturbance   Thanks.

## 2020-03-28 NOTE — Telephone Encounter (Signed)
Verbal order per Dr. Para March placed.

## 2020-03-28 NOTE — Telephone Encounter (Signed)
Called and spoke with patient to update him of status of his wheelchair. Patient appreciative of call.

## 2020-03-28 NOTE — Telephone Encounter (Signed)
Faxed as instructed per Dr. Para March.

## 2020-04-05 NOTE — Telephone Encounter (Signed)
Thanks.  Will await update.  

## 2020-04-05 NOTE — Telephone Encounter (Signed)
Sent email to [employer], Kathyrn Sheriff, Charlton Amor and Shane Crutch who can help with the DME order that has been placed to hopefully get this resolved.

## 2020-04-05 NOTE — Telephone Encounter (Signed)
I do not know where to send it otherwise.  If the patient can let us know then I would really appreciate it.

## 2020-04-05 NOTE — Telephone Encounter (Signed)
Devin Ramsey from [facility] called and stated that they received the fax from Dr. Para March but with his plan which is a HMO they can not fill the order and wanted to let you know so you can send it somewhere different.

## 2020-04-07 DIAGNOSIS — M25569 Pain in unspecified knee: Secondary | ICD-10-CM | POA: Diagnosis not present

## 2020-04-26 ENCOUNTER — Other Ambulatory Visit: Payer: Self-pay | Admitting: Cardiovascular Disease

## 2020-04-26 ENCOUNTER — Other Ambulatory Visit: Payer: Self-pay

## 2020-04-26 ENCOUNTER — Telehealth: Payer: Self-pay

## 2020-04-26 ENCOUNTER — Ambulatory Visit: Payer: Medicare Other

## 2020-04-26 NOTE — Telephone Encounter (Signed)
Called patient to complete AWV. Patient was scheduled for a virtual visit. Virtual visit would not connect. I believe because the patient is not signed up for MyChart. Called patient instead and he said that telephone visits are difficult because he is hard of hearing and he is not very savvy with the virtual environment either. He wanted to cancel this visit and complete this in office only with the provider if need be. He can communicate with people better face to face. Appointment cancelled per patient request.

## 2020-04-26 NOTE — Telephone Encounter (Signed)
Rx request sent to pharmacy.  

## 2020-04-28 ENCOUNTER — Encounter: Payer: Self-pay | Admitting: Oncology

## 2020-04-28 ENCOUNTER — Inpatient Hospital Stay: Payer: Medicare Other | Attending: Oncology

## 2020-04-28 ENCOUNTER — Inpatient Hospital Stay (HOSPITAL_BASED_OUTPATIENT_CLINIC_OR_DEPARTMENT_OTHER): Payer: Medicare Other | Admitting: Oncology

## 2020-04-28 ENCOUNTER — Other Ambulatory Visit: Payer: Self-pay

## 2020-04-28 ENCOUNTER — Inpatient Hospital Stay: Payer: Medicare Other

## 2020-04-28 VITALS — BP 104/67 | HR 76 | Temp 97.8°F | Resp 16 | Wt 152.5 lb

## 2020-04-28 DIAGNOSIS — F17200 Nicotine dependence, unspecified, uncomplicated: Secondary | ICD-10-CM | POA: Insufficient documentation

## 2020-04-28 DIAGNOSIS — F1090 Alcohol use, unspecified, uncomplicated: Secondary | ICD-10-CM

## 2020-04-28 DIAGNOSIS — Z79899 Other long term (current) drug therapy: Secondary | ICD-10-CM | POA: Diagnosis not present

## 2020-04-28 DIAGNOSIS — E538 Deficiency of other specified B group vitamins: Secondary | ICD-10-CM | POA: Insufficient documentation

## 2020-04-28 DIAGNOSIS — R531 Weakness: Secondary | ICD-10-CM | POA: Insufficient documentation

## 2020-04-28 DIAGNOSIS — I447 Left bundle-branch block, unspecified: Secondary | ICD-10-CM | POA: Diagnosis not present

## 2020-04-28 DIAGNOSIS — E039 Hypothyroidism, unspecified: Secondary | ICD-10-CM | POA: Diagnosis not present

## 2020-04-28 DIAGNOSIS — Z720 Tobacco use: Secondary | ICD-10-CM | POA: Diagnosis not present

## 2020-04-28 DIAGNOSIS — Z7901 Long term (current) use of anticoagulants: Secondary | ICD-10-CM | POA: Insufficient documentation

## 2020-04-28 DIAGNOSIS — Z148 Genetic carrier of other disease: Secondary | ICD-10-CM | POA: Diagnosis not present

## 2020-04-28 DIAGNOSIS — Z7289 Other problems related to lifestyle: Secondary | ICD-10-CM | POA: Diagnosis not present

## 2020-04-28 DIAGNOSIS — D649 Anemia, unspecified: Secondary | ICD-10-CM | POA: Diagnosis not present

## 2020-04-28 DIAGNOSIS — Z87891 Personal history of nicotine dependence: Secondary | ICD-10-CM | POA: Diagnosis not present

## 2020-04-28 DIAGNOSIS — I4821 Permanent atrial fibrillation: Secondary | ICD-10-CM | POA: Insufficient documentation

## 2020-04-28 DIAGNOSIS — Z8719 Personal history of other diseases of the digestive system: Secondary | ICD-10-CM | POA: Insufficient documentation

## 2020-04-28 DIAGNOSIS — I5022 Chronic systolic (congestive) heart failure: Secondary | ICD-10-CM | POA: Insufficient documentation

## 2020-04-28 DIAGNOSIS — I4891 Unspecified atrial fibrillation: Secondary | ICD-10-CM | POA: Diagnosis not present

## 2020-04-28 DIAGNOSIS — Z885 Allergy status to narcotic agent status: Secondary | ICD-10-CM | POA: Insufficient documentation

## 2020-04-28 DIAGNOSIS — E785 Hyperlipidemia, unspecified: Secondary | ICD-10-CM | POA: Diagnosis not present

## 2020-04-28 DIAGNOSIS — I110 Hypertensive heart disease with heart failure: Secondary | ICD-10-CM | POA: Insufficient documentation

## 2020-04-28 LAB — HEPATIC FUNCTION PANEL
ALT: 13 U/L (ref 0–44)
AST: 22 U/L (ref 15–41)
Albumin: 3.5 g/dL (ref 3.5–5.0)
Alkaline Phosphatase: 99 U/L (ref 38–126)
Bilirubin, Direct: 0.2 mg/dL (ref 0.0–0.2)
Indirect Bilirubin: 0.9 mg/dL (ref 0.3–0.9)
Total Bilirubin: 1.1 mg/dL (ref 0.3–1.2)
Total Protein: 6.1 g/dL — ABNORMAL LOW (ref 6.5–8.1)

## 2020-04-28 LAB — CBC WITH DIFFERENTIAL/PLATELET
Abs Immature Granulocytes: 0.02 10*3/uL (ref 0.00–0.07)
Basophils Absolute: 0.1 10*3/uL (ref 0.0–0.1)
Basophils Relative: 1 %
Eosinophils Absolute: 0.1 10*3/uL (ref 0.0–0.5)
Eosinophils Relative: 3 %
HCT: 34.7 % — ABNORMAL LOW (ref 39.0–52.0)
Hemoglobin: 12.2 g/dL — ABNORMAL LOW (ref 13.0–17.0)
Immature Granulocytes: 0 %
Lymphocytes Relative: 27 %
Lymphs Abs: 1.5 10*3/uL (ref 0.7–4.0)
MCH: 33.7 pg (ref 26.0–34.0)
MCHC: 35.2 g/dL (ref 30.0–36.0)
MCV: 95.9 fL (ref 80.0–100.0)
Monocytes Absolute: 0.4 10*3/uL (ref 0.1–1.0)
Monocytes Relative: 7 %
Neutro Abs: 3.5 10*3/uL (ref 1.7–7.7)
Neutrophils Relative %: 62 %
Platelets: 144 10*3/uL — ABNORMAL LOW (ref 150–400)
RBC: 3.62 MIL/uL — ABNORMAL LOW (ref 4.22–5.81)
RDW: 12.7 % (ref 11.5–15.5)
WBC: 5.6 10*3/uL (ref 4.0–10.5)
nRBC: 0 % (ref 0.0–0.2)

## 2020-04-28 LAB — IRON AND TIBC
Iron: 86 ug/dL (ref 45–182)
Saturation Ratios: 32 % (ref 17.9–39.5)
TIBC: 270 ug/dL (ref 250–450)
UIBC: 184 ug/dL

## 2020-04-28 LAB — VITAMIN B12: Vitamin B-12: 492 pg/mL (ref 180–914)

## 2020-04-28 LAB — FERRITIN: Ferritin: 175 ng/mL (ref 24–336)

## 2020-04-28 MED ORDER — CYANOCOBALAMIN 1000 MCG/ML IJ SOLN
1000.0000 ug | Freq: Once | INTRAMUSCULAR | Status: AC
  Administered 2020-04-28: 1000 ug via INTRAMUSCULAR
  Filled 2020-04-28: qty 1

## 2020-04-28 NOTE — Progress Notes (Signed)
Hematology/Oncology  [facility] Telephone:([phone] [phone]   Patient Care Team: [name], MD as PCP - General (Family Medicine) [name] Tollie Pizza, MD as PCP - Cardiology (Cardiology) Antonieta Iba, MD as Consulting Physician (Cardiology) Rickard Patience, MD as Consulting Physician (Oncology)  REFERRING PROVIDER: [name], MD  REASON FOR VISIT:  Follow-up for elevated iron level HISTORY OF PRESENTING ILLNESS:  Devin Ramsey is a  74 y.o.  male with PMH listed below was seen in consultation at the request of  [name], MD  for evaluation of iron overload.  Patient had lab work done on 11/16/2018.  Found to have a ferritin level of 423.  07/16/2018 saturation 53.6.  Transferrin 144. Patient had a history of atrial fibrillation, LBBB, chronic systolic CHF, 05/29/2018 2D echo showed LVEF 35-40% History of chronic alcohol use, 2-3 beers a day on average Former smoker, quit in 2016.  History of 55-year pack smoking.  Today in the clinic, he feels little weak.  Did not eat much breakfast in the morning.  Blood pressure was low, first reading 71/41, heart rate 68, second reading 85/57, with heart rate of 59. Denies any dizziness or lightheadedness. Patient is on Toprol XL 25 mg daily, Lasix 20 mg daily,entresto 24-26mg  BID.  He took all his medication the morning.  INTERVAL HISTORY Devin Ramsey is a 74 y.o. male who has above history reviewed by me today presents for follow up visit for management of elevated iron level Problems and complaints are listed below: Patient feels okay at the baseline.  He denies any shortness of breath, cough, unintentional weight loss. He continues to drink alcohol. He has been on vitamin B12 monthly injection due to being not compliant on oral vitamin B12 supplementation .  Review of Systems  Constitutional: Negative for appetite change, chills, fatigue, fever and unexpected weight change.  HENT:    Negative for hearing loss and voice change.   Eyes: Negative for eye problems and icterus.  Respiratory: Negative for chest tightness, cough and shortness of breath.   Cardiovascular: Negative for chest pain and leg swelling.  Gastrointestinal: Negative for abdominal distention and abdominal pain.  Endocrine: Negative for hot flashes.  Genitourinary: Negative for difficulty urinating, dysuria and frequency.   Musculoskeletal: Negative for arthralgias.  Skin: Negative for itching and rash.  Neurological: Negative for dizziness, light-headedness and numbness.  Hematological: Negative for adenopathy. Does not bruise/bleed easily.  Psychiatric/Behavioral: Negative for confusion.    MEDICAL HISTORY:  Past Medical History:  Diagnosis Date  . Anemia   . B12 deficiency 10/28/2019  . Bilateral pleural effusion   . Chronic systolic CHF (congestive heart failure) (HCC)    a. TTE 2/16: EF of 35-40%, WMA consistent with BBB, normal sized LA; b. TTE 11/16: F of 35-40%, mild concentric LVH, no RWMA, normal LV diastolic function parameters, mild to moderate AI, moderate MR, mildly to moderately dilated left atrium measuring 42 mm  . COPD (chronic obstructive pulmonary disease) (HCC)   . Essential hypertension   . Gout   . HLD (hyperlipidemia)   . Hypothyroidism   . LBBB (left bundle branch block)   . Permanent atrial fibrillation (HCC)    a. CHADS2VSC => 4 (CHF, HTN, age x 1, vascular disease); b. on Eliquis  . Pneumonia 2016  . Tobacco abuse     SURGICAL HISTORY: Past Surgical History:  Procedure Laterality Date  . HEMORRHOID SURGERY    . SHOULDER SURGERY  right shoulder   . TONSILLECTOMY      SOCIAL HISTORY: Social History   Socioeconomic History  . Marital status: Married    Spouse name: Not on file  . Number of children: Not on file  . Years of education: Not on file  . Highest education level: Not on file  Occupational History  . Not on file  Tobacco Use  . Smoking  status: Former Smoker    Years: 55.00    Types: Cigarettes    Quit date: 06/27/2014    Years since quitting: 5.8  . Smokeless tobacco: Never Used  Vaping Use  . Vaping Use: Never used  Substance and Sexual Activity  . Alcohol use: Yes    Alcohol/week: 15.0 standard drinks    Types: 15 Cans of beer per week    Comment: 2-3 beers a day average  . Drug use: No  . Sexual activity: Not Currently  Other Topics Concern  . Not on file  Social History Narrative   Married 1966   From [place] fan   1 son, local   Social Determinants of Health   Financial Resource Strain: Not on file  Food Insecurity: Not on file  Transportation Needs: Not on file  Physical Activity: Not on file  Stress: Not on file  Social Connections: Not on file  Intimate Partner Violence: Not on file    FAMILY HISTORY: Family History  Adopted: Yes  Family history unknown: Yes    ALLERGIES:  is allergic to codeine and indocin [indomethacin].  MEDICATIONS:  Current Outpatient Medications  Medication Sig Dispense Refill  . clobetasol cream (TEMOVATE) 0.05 % Apply 1 application topically 2 (two) times daily as needed. Use the least amount possible. 45 g 2  . ELIQUIS 5 MG TABS tablet Take 1 tablet by mouth twice daily 180 tablet 0  . ENTRESTO 24-26 MG Take 1 tablet by mouth twice daily 180 tablet 0  . furosemide (LASIX) 20 MG tablet Take 1 tablet (20 mg total) by mouth daily as needed. 90 tablet 3  . hydrOXYzine (ATARAX/VISTARIL) 10 MG tablet TAKE 1/2 TO 1 (ONE-HALF TO ONE) TABLET BY MOUTH EVERY 8 HOURS AS NEEDED FOR ITCHING 30 tablet 2  . levothyroxine (SYNTHROID) 75 MCG tablet Take 1 tablet (75 mcg total) by mouth daily. 90 tablet 3  . predniSONE (DELTASONE) 10 MG tablet TAKE 2 TABLETS BY MOUTH ONCE DAILY AS NEEDED WITH FOOD FOR GOUT 20 tablet 0  . rosuvastatin (CRESTOR) 10 MG tablet Take 1 tablet by mouth once daily 90 tablet 0  . traMADol (ULTRAM) 50 MG tablet TAKE 2 TABLETS BY MOUTH EVERY 12 HOURS AS  NEEDED FOR  KNEE  PAIN 120 tablet 1  . triamcinolone cream (KENALOG) 0.1 % APPLY TOPICALLY DAILY AS NEEDED 454 g 0  . tiotropium (SPIRIVA HANDIHALER) 18 MCG inhalation capsule Place 1 capsule (18 mcg total) into inhaler and inhale daily. (Patient not taking: Reported on 04/28/2020) 30 capsule 12  . vitamin B-12 (CYANOCOBALAMIN) 1000 MCG tablet Take 1 tablet (1,000 mcg total) by mouth daily. (Patient not taking: Reported on 04/28/2020) 90 tablet 1   No current facility-administered medications for this visit.     PHYSICAL EXAMINATION: ECOG PERFORMANCE STATUS: 2 - Symptomatic, <50% confined to bed Vitals:   04/28/20 0951  BP: 104/67  Pulse: 76  Resp: 16  Temp: 97.8 F (36.6 C)  SpO2: 97%   Filed Weights   04/28/20 0951  Weight: 152 lb 8 oz (  69.2 kg)    Physical Exam Constitutional:      General: He is not in acute distress.    Appearance: He is not ill-appearing.     Comments: Patient sits in the wheelchair  HENT:     Head: Normocephalic and atraumatic.  Eyes:     General: No scleral icterus.    Pupils: Pupils are equal, round, and reactive to light.  Cardiovascular:     Rate and Rhythm: Normal rate and regular rhythm.     Heart sounds: Normal heart sounds.  Pulmonary:     Effort: Pulmonary effort is normal. No respiratory distress.     Breath sounds: No wheezing.     Comments: Decreased breath sounds bilaterally. Abdominal:     General: Bowel sounds are normal. There is no distension.     Palpations: Abdomen is soft. There is no mass.     Tenderness: There is no abdominal tenderness.  Musculoskeletal:        General: No deformity. Normal range of motion.     Cervical back: Normal range of motion and neck supple.  Skin:    General: Skin is warm and dry.     Findings: No erythema or rash.  Neurological:     Mental Status: He is alert and oriented to person, place, and time. Mental status is at baseline.     Cranial Nerves: No cranial nerve deficit.      Coordination: Coordination normal.  Psychiatric:        Mood and Affect: Mood normal.       LABORATORY DATA:  I have reviewed the data as listed Lab Results  Component Value Date   WBC 5.6 04/28/2020   HGB 12.2 (L) 04/28/2020   HCT 34.7 (L) 04/28/2020   MCV 95.9 04/28/2020   PLT 144 (L) 04/28/2020   Recent Labs    07/16/19 1109 10/26/19 1308 04/28/20 0930  NA 126* 130*  --   K 4.7 4.6  --   CL 94* 99  --   CO2 22 22  --   GLUCOSE 82 122*  --   BUN 8 12  --   CREATININE 1.18 1.37*  --   CALCIUM 8.1* 8.5*  --   GFRNONAA 60 50*  --   GFRAA 70 58*  --   PROT 5.7* 6.5 6.1*  ALBUMIN 3.6* 3.8 3.5  AST 21 26 22   ALT 11 17 13   ALKPHOS 90 88 99  BILITOT 0.9 1.0 1.1  BILIDIR 0.31  --  0.2  IBILI  --   --  0.9   Iron/TIBC/Ferritin/ %Sat    Component Value Date/Time   IRON 86 04/28/2020 0930   TIBC 270 04/28/2020 0930   FERRITIN 175 04/28/2020 0930   IRONPCTSAT 32 04/28/2020 0930      RADIOGRAPHIC STUDIES: I have personally reviewed the radiological images as listed and agreed with the findings in the report. No results found.   ASSESSMENT & PLAN:  1. Hemochromatosis carrier   2. B12 deficiency   3. Chronic alcohol use   4. Tobacco abuse     Hemochromatosis carrier- a single mutation of H63D Iron panel was reviewed and discussed with patient.  Stable ferritin and iron saturation.  No need for phlebotomy.  Chronic anemia has improved.  Hemoglobin 12.2. History of vitamin B12 deficiency, chronic alcohol use.  Vitamin B12 level at 492.14/02/2020  Patient received Vitamin B12 injection today.  Repeat another B12 injection in 6 months. Alcohol cessation was discussed  with patient.  He is not interested.  Tobacco abuse, smoke cessation was discussed with patient.  He is not interested.  I discussed about lung cancer screening program and he agrees.  Will refer to lung cancer screening program.   Orders Placed This Encounter  Procedures  . Hepatic function panel     Standing Status:   Future    Number of Occurrences:   1    Standing Expiration Date:   04/29/2020  . CBC with Differential/Platelet    Standing Status:   Future    Standing Expiration Date:   04/28/2021  . Vitamin B12    Standing Status:   Future    Standing Expiration Date:   04/28/2021  . Hepatic function panel    Standing Status:   Future    Standing Expiration Date:   04/28/2021  . Ambulatory Referral for Lung Cancer Screening    Referral Priority:   Routine    Referral Type:   Consultation    Referral Reason:   Specialty Services Required    Number of Visits Requested:   1    All questions were answered. The patient knows to call the clinic with any problems questions or concerns.  cc [name], MD    Return of visit: 6 months  Rickard Patience, MD, PhD Hematology Oncology [facility] 04/28/2020

## 2020-04-28 NOTE — Progress Notes (Signed)
Patient denies new problems/concerns today.   °

## 2020-05-05 ENCOUNTER — Telehealth: Payer: Self-pay | Admitting: *Deleted

## 2020-05-05 ENCOUNTER — Encounter: Payer: Self-pay | Admitting: *Deleted

## 2020-05-05 DIAGNOSIS — Z87891 Personal history of nicotine dependence: Secondary | ICD-10-CM

## 2020-05-05 DIAGNOSIS — Z122 Encounter for screening for malignant neoplasm of respiratory organs: Secondary | ICD-10-CM

## 2020-05-05 NOTE — Telephone Encounter (Signed)
Received referral for initial lung cancer screening scan. Contacted patient and obtained smoking history,(former, quit 06/27/14, 84 pack year) as well as answering questions related to screening process. Patient denies signs of lung cancer such as weight loss or hemoptysis. Patient denies comorbidity that would prevent curative treatment if lung cancer were found. Patient is scheduled for shared decision making visit and CT scan on 05/31/20 at 1030am.

## 2020-05-06 ENCOUNTER — Other Ambulatory Visit: Payer: Self-pay | Admitting: Family Medicine

## 2020-05-07 DIAGNOSIS — M25569 Pain in unspecified knee: Secondary | ICD-10-CM | POA: Diagnosis not present

## 2020-05-12 ENCOUNTER — Other Ambulatory Visit: Payer: Self-pay | Admitting: Family Medicine

## 2020-05-15 ENCOUNTER — Other Ambulatory Visit: Payer: Self-pay | Admitting: Cardiovascular Disease

## 2020-05-15 NOTE — Telephone Encounter (Signed)
Please Advise

## 2020-05-15 NOTE — Telephone Encounter (Signed)
Sent. Thanks.   

## 2020-05-15 NOTE — Telephone Encounter (Signed)
Rx request sent to pharmacy.  

## 2020-05-31 ENCOUNTER — Ambulatory Visit: Admission: RE | Admit: 2020-05-31 | Payer: Medicare Other | Source: Ambulatory Visit

## 2020-05-31 ENCOUNTER — Inpatient Hospital Stay: Payer: Medicare Other | Admitting: Oncology

## 2020-05-31 ENCOUNTER — Other Ambulatory Visit: Payer: Self-pay

## 2020-06-07 DIAGNOSIS — M25569 Pain in unspecified knee: Secondary | ICD-10-CM | POA: Diagnosis not present

## 2020-06-19 ENCOUNTER — Other Ambulatory Visit: Payer: Self-pay | Admitting: Cardiovascular Disease

## 2020-06-19 NOTE — Telephone Encounter (Signed)
Rx request sent to pharmacy.  

## 2020-06-21 ENCOUNTER — Ambulatory Visit: Payer: Medicare Other | Admitting: Physician Assistant

## 2020-06-26 ENCOUNTER — Ambulatory Visit: Payer: Medicare Other | Admitting: Family

## 2020-06-26 NOTE — Progress Notes (Deleted)
Office Visit    Patient Name: Devin Ramsey Date of Encounter: 06/26/2020  Primary Care Provider:  [name], MD Primary Cardiologist:  Julien Nordmann, MD Electrophysiologist:  None   Chief Complaint    Devin Ramsey is a 75 y.o. male with a hx of *** presents today for ***   Past Medical History    Past Medical History:  Diagnosis Date  . Anemia   . B12 deficiency 10/28/2019  . Bilateral pleural effusion   . Chronic systolic CHF (congestive heart failure) (HCC)    a. TTE 2/16: EF of 35-40%, WMA consistent with BBB, normal sized LA; b. TTE 11/16: F of 35-40%, mild concentric LVH, no RWMA, normal LV diastolic function parameters, mild to moderate AI, moderate MR, mildly to moderately dilated left atrium measuring 42 mm  . COPD (chronic obstructive pulmonary disease) (HCC)   . Essential hypertension   . Gout   . HLD (hyperlipidemia)   . Hypothyroidism   . LBBB (left bundle branch block)   . Permanent atrial fibrillation (HCC)    a. CHADS2VSC => 4 (CHF, HTN, age x 1, vascular disease); b. on Eliquis  . Pneumonia 2016  . Tobacco abuse    Past Surgical History:  Procedure Laterality Date  . HEMORRHOID SURGERY    . SHOULDER SURGERY     right shoulder   . TONSILLECTOMY      Allergies  Allergies  Allergen Reactions  . Codeine     Sedation.  Tolerates other opiates.    . Indocin [Indomethacin]     Elevated BP, would avoid given his h/o CHF    History of Present Illness    Devin Ramsey is a 75 y.o. male with a hx of aortic atherosclerosis, permanent atrial fibrillation, chronic systolic heart failure, previous tobacco use, left bundle branch block s/p CRT-P, hypotension, hyperlipidemia. He was last seen 11/15/19 by Dr. [name].  Most recent echocardiogram 08/13/19 with LVEF 35-40%, LV global hypokinesis, RVSF mildly reduced with RVSP 39.74mmHg, RV mildly enlarged, mildly elevated PASP, LA severely dilated, moderate MR, mild to moderate AI.    ***   EKGs/Labs/Other Studies Reviewed:   The following studies were reviewed today:  Echo 07/2019 IMPRESSIONS   1. Left ventricular ejection fraction, by estimation, is 35 to 40%. The  left ventricle has moderately decreased function. The left ventricle  demonstrates global hypokinesis. Left ventricular diastolic parameters are  indeterminate.   2. Right ventricular systolic function is mildly reduced. The right  ventricular size is mildly enlarged. There is mildly elevated pulmonary  artery systolic pressure. The estimated right ventricular systolic  pressure is 39.2 mmHg.   3. Left atrial size was severely dilated.   4. Moderate mitral valve regurgitation.   5. Aortic valve regurgitation is mild to moderate.   EKG:  EKG is  ordered today.  The ekg ordered today demonstrates ***  Recent Labs: 07/16/2019: Magnesium 2.0 10/26/2019: BUN 12; Creatinine, Ser 1.37; Potassium 4.6; Sodium 130; TSH 6.853 04/28/2020: ALT 13; Hemoglobin 12.2; Platelets 144  Recent Lipid Panel    Component Value Date/Time   CHOL 102 07/16/2019 1109   TRIG 48 07/16/2019 1109   HDL 60 07/16/2019 1109   CHOLHDL 1.7 07/16/2019 1109   CHOLHDL 2 11/12/2018 1008   VLDL 12.4 11/12/2018 1008   LDLCALC 30 07/16/2019 1109   LDLDIRECT 38 07/16/2019 1109   Risk Assessment/Calculations:  {Does this patient have ATRIAL FIBRILLATION?:[id number]}  Home Medications   No outpatient medications have been  marked as taking for the 06/26/20 encounter (Appointment) with Alver Sorrow, NP.     Review of Systems   ***   ROS All other systems reviewed and are otherwise negative except as noted above.  Physical Exam    VS:  There were no vitals taken for this visit. , BMI There is no height or weight on file to calculate BMI.  Wt Readings from Last 3 Encounters:  04/28/20 152 lb 8 oz (69.2 kg)  11/15/19 161 lb (73 kg)  11/11/19 161 lb 9 oz (73.3 kg)    GEN: Well nourished, well developed, in no acute  distress. HEENT: normal. Neck: Supple, no JVD, carotid bruits, or masses. Cardiac: ***RRR, no murmurs, rubs, or gallops. No clubbing, cyanosis, edema.  ***Radials/DP/PT 2+ and equal bilaterally.  Respiratory:  ***Respirations regular and unlabored, clear to auscultation bilaterally. GI: Soft, nontender, nondistended. MS: No deformity or atrophy. Skin: Warm and dry, no rash. Neuro:  Strength and sensation are intact. Psych: Normal affect.  Assessment & Plan    1. Chronic systolic heart failure -   2. Permanent atrial fibrillation -   3. Chronic anticoagulation -   4. Hypotension -   Disposition: Follow up {follow up:15908} with Dr. [name] or APP.   Signed, Alver Sorrow, NP 06/26/2020, 7:40 AM [facility]

## 2020-06-27 ENCOUNTER — Encounter: Payer: Self-pay | Admitting: Family

## 2020-07-08 DIAGNOSIS — M25569 Pain in unspecified knee: Secondary | ICD-10-CM | POA: Diagnosis not present

## 2020-07-13 ENCOUNTER — Other Ambulatory Visit: Payer: Self-pay | Admitting: Cardiovascular Disease

## 2020-07-13 ENCOUNTER — Telehealth: Payer: Self-pay | Admitting: Family Medicine

## 2020-07-13 NOTE — Telephone Encounter (Signed)
Refill Request.  

## 2020-07-13 NOTE — Telephone Encounter (Signed)
Prescription refill request for Eliquis received. Indication: Afib Last office visit: Graciela Husbands, 08/12/2019 Scr: 1.37, 10/26/2019 Age: 75 yo  Weight: 69.2 kg   Pt is on the correct dose per dosing criteria, Eliquis 5mg  BID. Prescription refill sent.

## 2020-07-13 NOTE — Telephone Encounter (Signed)
-----   Message from [name], MD sent at 07/12/2020 12:09 PM EST ----- Can he get scheduled for a yearly visit later this spring? Thanks.   Clelia Croft

## 2020-07-14 ENCOUNTER — Other Ambulatory Visit: Payer: Self-pay

## 2020-07-14 ENCOUNTER — Ambulatory Visit (INDEPENDENT_AMBULATORY_CARE_PROVIDER_SITE_OTHER): Payer: Medicare Other | Admitting: Physician Assistant

## 2020-07-14 ENCOUNTER — Encounter: Payer: Self-pay | Admitting: Physician Assistant

## 2020-07-14 VITALS — BP 110/70 | HR 66 | Ht 70.0 in | Wt 152.0 lb

## 2020-07-14 DIAGNOSIS — Z87891 Personal history of nicotine dependence: Secondary | ICD-10-CM

## 2020-07-14 DIAGNOSIS — E785 Hyperlipidemia, unspecified: Secondary | ICD-10-CM

## 2020-07-14 DIAGNOSIS — I38 Endocarditis, valve unspecified: Secondary | ICD-10-CM

## 2020-07-14 DIAGNOSIS — I700 Atherosclerosis of aorta: Secondary | ICD-10-CM

## 2020-07-14 DIAGNOSIS — I10 Essential (primary) hypertension: Secondary | ICD-10-CM | POA: Diagnosis not present

## 2020-07-14 DIAGNOSIS — M1A0720 Idiopathic chronic gout, left ankle and foot, without tophus (tophi): Secondary | ICD-10-CM | POA: Diagnosis not present

## 2020-07-14 DIAGNOSIS — J449 Chronic obstructive pulmonary disease, unspecified: Secondary | ICD-10-CM

## 2020-07-14 DIAGNOSIS — I4892 Unspecified atrial flutter: Secondary | ICD-10-CM | POA: Diagnosis not present

## 2020-07-14 DIAGNOSIS — I447 Left bundle-branch block, unspecified: Secondary | ICD-10-CM | POA: Diagnosis not present

## 2020-07-14 DIAGNOSIS — D649 Anemia, unspecified: Secondary | ICD-10-CM

## 2020-07-14 DIAGNOSIS — J439 Emphysema, unspecified: Secondary | ICD-10-CM

## 2020-07-14 DIAGNOSIS — I4821 Permanent atrial fibrillation: Secondary | ICD-10-CM

## 2020-07-14 DIAGNOSIS — E039 Hypothyroidism, unspecified: Secondary | ICD-10-CM

## 2020-07-14 NOTE — Progress Notes (Signed)
Office Visit    Patient Name: Devin Ramsey Date of Encounter: 07/14/2020  Primary Care Provider:  [name], MD Primary Cardiologist:  Julien Nordmann, MD  Chief Complaint    75 yo male with h/o permanent Afib/flutter on Eliquis, HFpEF felt 2/2 NICM by MPI, HTN, HLD, LBBB, moderate aortic atherosclerosis, COPD, prior tobacco use quitting in 2016, hypothyroidism, anemia, gout, and who presents for 6 month follow-up.  Past Medical History    Past Medical History:  Diagnosis Date  . Anemia   . B12 deficiency 10/28/2019  . Bilateral pleural effusion   . Chronic systolic CHF (congestive heart failure) (HCC)    a. TTE 2/16: EF of 35-40%, WMA consistent with BBB, normal sized LA; b. TTE 11/16: F of 35-40%, mild concentric LVH, no RWMA, normal LV diastolic function parameters, mild to moderate AI, moderate MR, mildly to moderately dilated left atrium measuring 42 mm  . COPD (chronic obstructive pulmonary disease) (HCC)   . Essential hypertension   . Gout   . HLD (hyperlipidemia)   . Hypothyroidism   . LBBB (left bundle branch block)   . Permanent atrial fibrillation (HCC)    a. CHADS2VSC => 4 (CHF, HTN, age x 1, vascular disease); b. on Eliquis  . Pneumonia 2016  . Tobacco abuse    Past Surgical History:  Procedure Laterality Date  . HEMORRHOID SURGERY    . SHOULDER SURGERY     right shoulder   . TONSILLECTOMY      Allergies  Allergies  Allergen Reactions  . Codeine     Sedation.  Tolerates other opiates.    . Indocin [Indomethacin]     Elevated BP, would avoid given his h/o CHF    History of Present Illness    75 yo male with PMH as above. Prior 2016 CT showed extensive CAC, aorta, as well as the origin of the SMA and renal arteries. Afib reportedly dates back to at least 06/2014 by review of EKGs. 06/2014 echo showed EF 35-40% and WMA consistent with LBBB. EKG 08/2014, 02/2015, and 03/2015 with SR and 1st degree AVB. 03/2015 TTE showed EF 35-40%, mild concentric  LVH, mild to moderate AI, moderate MR, and mild to moderately dilated LAE (19mm). From that point forward, he was in Afib/flutter. He was seen 08/2017 with stable SOB. In late 2019, he had a heme (+) stool and Hgb 9-10 with colonoscopy recommended but not performed. Preop evaluation was performed with subsequent 05/29/2018 echo that showed stable EF 35-40%. MPI 06/03/18 showed no significant ischemia with a small region of fixed defect in the apical and apical anteroseptal region consistent with prior MI, EF 53%, and was ruled a low risk scan. LE arterial studies 05/29/2018 showed moderately reduced ABI at 0.5 on the R and 0.48 on the L. Duplex showed bilateral SFA occlusion with tibial dz.  When seen same day by Dr. Kirke Corin for vascular consult, he reported a long h/o ingrown toenail on the second L toe and was started on abx and referred to podiatry. He was felt to have minimal claudication likely due to poor functional capacity. No ulceration of his LE was noted. When seen in office 05/2018, he reported a rash x3 years and stable SOB. His weight was down 9 lbs with recommendation to f/u with PCP and GI as it was unintentional wt loss. Claudication sx were stable. He was seen 12/25/2018 and was doing well. He had not needed his Lasix in 1 year. He was  drinking 5-18 of 12 oz beers daily. He had not restarted smoking (quit 2016). He was uninterested in GI evaluation for heme (+) stools with associated anemia and wt. loss - it was noted his wt was down another 10 lbs in less than 8 months.  He was continued on Eliquis 5mg  daily with stable Hgb and reportedly followed by hematology for elevated ferritin level. Hypotension and heart rates in the 60s precluded escalation of GDMT. Recent AKI, suspected 2/2 dehydration and excessive EtOH use,  precluded addition of spironolactone. Hydration was encouraged as long as under 2L of water daily. He was not interested in EP referral for consideration of CRT given his underlying CM with  LBBB. F/u with Dr. Kirke CorinArida as needed for PAD was recommended.    When seen in clinic 06/2019, he was hypotensive with ventricular rate 41 bpm and SPO2 83%.  BB was discontinued. He was referred to EP for reduced EF, bradycardia, and LBBB. He reported stable DOE and SOB.  He was still drinking 8-12 beers daily with recommendations for cessation.  He was not physically active.  He denied smoking.  Extremities were noted to be blue/black, including his nails.  His face appeared ashen. Repeat echo was recommended.   He was last seen in clinic 10/2019 by his primary cardiologist, Dr. Mariah MillingGollan.  It was noted that EP had seen the pt and device was not indicated for reduced EF / bradycardia in the setting of left bundle branch block given borderline QRS and improved rate/sx after stopping his BB.  His weight was down with poor oral intake.  His BP was low with patient denying orthostatic symptoms.  He reported weak legs.  He denied a regular exercise program.  Today, 07/14/2020, he presents to clinic and notes he is doing well from a cardiac standpoint.  BP improved at 110/70.  He denies any lower extremity edema and reports that he does not use his Lasix very often.  He denies any chest pain, racing heart rate, or palpitations.  No presyncope or syncope.  No shortness of breath despite low oxygen saturation today at 90%.  He denies any orthopnea, PND, early satiety, lower extremity edema, abdominal distention, or weight gain.  He reports that he is eating well.  He is still not active at baseline and presents in a wheelchair today.  He reports leg weakness.  No signs or symptoms of bleeding.  He reports medication compliance, though Eliquis noted to be expensive with recommendation for coupon and samples today.  Home Medications    Prior to Admission medications   Medication Sig Start Date End Date Taking? Authorizing Provider  clobetasol cream (TEMOVATE) 0.05 % Apply 1 application topically 2 (two) times daily as  needed. Use the least amount possible. 02/17/17   Joaquim Namuncan, Graham S, MD  ELIQUIS 5 MG TABS tablet Take 1 tablet by mouth twice daily 04/29/19   Antonieta IbaGollan, Timothy J, MD  furosemide (LASIX) 20 MG tablet Take 1 tablet (20 mg total) by mouth daily as needed. 06/29/15   Antonieta IbaGollan, Timothy J, MD  hydrOXYzine (ATARAX/VISTARIL) 10 MG tablet TAKE 1/2 TO 1 (ONE-HALF TO ONE) TABLET BY MOUTH EVERY 8 HOURS AS NEEDED FOR ITCHING 06/30/19   Joaquim Namuncan, Graham S, MD  levothyroxine (SYNTHROID) 50 MCG tablet Take 1 tablet (50 mcg total) by mouth daily. 11/16/18   Joaquim Namuncan, Graham S, MD  metoprolol succinate (TOPROL-XL) 25 MG 24 hr tablet Take 1 tablet (25 mg total) by mouth daily. 01/18/19   Eula Listenunn, Ryan  M, PA-C  predniSONE (DELTASONE) 10 MG tablet TAKE 2 TABLETS BY MOUTH ONCE DAILY AS NEEDED WITH FOOD FOR GOUT 05/11/19   [name], MD  rosuvastatin (CRESTOR) 10 MG tablet Take 1 tablet by mouth once daily 04/29/19   Antonieta Iba, MD  sacubitril-valsartan (ENTRESTO) 24-26 MG Take 1 tablet by mouth 2 (two) times daily. 06/10/18   Dunn, Raymon Mutton, PA-C  tiotropium (SPIRIVA HANDIHALER) 18 MCG inhalation capsule Place 1 capsule (18 mcg total) into inhaler and inhale daily. 10/03/16   [name], MD  traMADol Janean Sark) 50 MG tablet TAKE 2 TABLETS BY MOUTH EVERY 12 HOURS AS NEEDED FOR  KNEE  PAIN 05/28/19   [name], MD  triamcinolone cream (KENALOG) 0.1 % APPLY TOPICALLY DAILY AS NEEDED 06/01/19   [name], MD    Review of Systems    He denies chest pain, palpitations, pnd, orthopnea, n, v, dizziness, syncope, edema, weight gain, or early satiety. He reports stable DOE/SOB, despite oxygen saturation 90%.  No signs or symptoms of bleeding.  All other systems reviewed and are otherwise negative except as noted above.  Physical Exam    VS:  BP 110/70 (BP Location: Right Arm, Patient Position: Sitting, Cuff Size: Normal)   Pulse 66   Ht 5\' 10"  (1.778 m)   Wt 152 lb (68.9 kg)   SpO2 90%   BMI 21.81 kg/m  , BMI  Body mass index is 21.81 kg/m. GEN: Frail male.  Smells of smoke.  Seated in wheelchair.  HEENT: normal. Neck: No JVD, carotid bruits, or masses. Cardiac IRIR, distant heart sounds, faint holosystolic murmur. No rubs, or gallops. No clubbing, cyanosis, edema.  Radials/DP/PT 1+ and equal bilaterally.  Respiratory:  Coarse breath sounds and wheezing bilaterally. GI: Soft, nontender, nondistended, BS + x 4. MS: no deformity or atrophy. Skin: cold and dry, no rash. Neuro:  AAO with extensive orientation questions today Psych: Normal affect.  Accessory Clinical Findings    ECG personally reviewed by me today -Afib, 66 bpm, QRS 136, left bundle branch block, T wave inversion in 1, 2, aVL, V4 to V6- no acute changes.  VITALS Reviewed   Temp Readings from Last 3 Encounters:  04/28/20 97.8 F (36.6 C) (Tympanic)  11/11/19 (!) 96.9 F (36.1 C) (Temporal)  10/28/19 97.6 F (36.4 C) (Tympanic)   BP Readings from Last 3 Encounters:  07/14/20 110/70  04/28/20 104/67  11/15/19 (!) 77/47   Pulse Readings from Last 3 Encounters:  07/14/20 66  04/28/20 76  11/15/19 73    Wt Readings from Last 3 Encounters:  07/14/20 152 lb (68.9 kg)  04/28/20 152 lb 8 oz (69.2 kg)  11/15/19 161 lb (73 kg)     LABS  reviewed     Lab Results  Component Value Date   WBC 5.6 04/28/2020   HGB 12.2 (L) 04/28/2020   HCT 34.7 (L) 04/28/2020   MCV 95.9 04/28/2020   PLT 144 (L) 04/28/2020   Lab Results  Component Value Date   CREATININE 1.37 (H) 10/26/2019   BUN 12 10/26/2019   NA 130 (L) 10/26/2019   K 4.6 10/26/2019   CL 99 10/26/2019   CO2 22 10/26/2019   Lab Results  Component Value Date   ALT 13 04/28/2020   AST 22 04/28/2020   ALKPHOS 99 04/28/2020   BILITOT 1.1 04/28/2020   Lab Results  Component Value Date   CHOL 102 07/16/2019   HDL 60 07/16/2019   LDLCALC  30 07/16/2019   LDLDIRECT 38 07/16/2019   TRIG 48 07/16/2019   CHOLHDL 1.7 07/16/2019    No results found for:  HGBA1C Lab Results  Component Value Date   TSH 6.853 (H) 10/26/2019     STUDIES/PROCEDURES reviewed    Echo 07/2019 1. Left ventricular ejection fraction, by estimation, is 35 to 40%. The  left ventricle has moderately decreased function. The left ventricle  demonstrates global hypokinesis. Left ventricular diastolic parameters are  indeterminate.  2. Right ventricular systolic function is mildly reduced. The right  ventricular size is mildly enlarged. There is mildly elevated pulmonary  artery systolic pressure. The estimated right ventricular systolic  pressure is 39.2 mmHg.  3. Left atrial size was severely dilated.  4. Moderate mitral valve regurgitation.  5. Aortic valve regurgitation is mild to moderate.   12/2018 US abdomen IMPRESSION: Cholelithiasis without signs of acute cholecystitis Mild hepatic steatosis  05/29/2018 Echo - Left ventricle: The cavity size was normal. There was mild  concentric hypertrophy. Systolic function was moderately reduced.  The estimated ejection fraction was in the range of 35% to 40%.  The study is not technically sufficient to allow evaluation of LV  diastolic function.  - Aortic valve: There was mild regurgitation.  - Mitral valve: There was moderate regurgitation.  - Left atrium: The atrium was moderately dilated.  - Pulmonary arteries: Systolic pressure could not be accurately  estimated.   05/29/2018 LE arterial Summary:  Right: Total occlusion noted in the superficial femoral artery. Total  occlusion noted in the anterior tibial artery. Total occlusion noted in  the peroneal artery.  Left: Total occlusion noted in the superficial femoral artery and/or  popliteal artery.  Suggest follow up study in [facility] consult scheduled for 05/29/18.  Vascular consult recommended.   05/29/2018 [place] LE ABI Summary:  Right: Resting right ankle-brachial index indicates moderate right lower  extremity arterial disease. The right  toe-brachial index is abnormal.  ABI is the low end of "moderate".  Left: Resting left ankle-brachial index indicates severe left lower  extremity arterial disease. The left toe-brachial index is abnormal.   MPI 06/03/2018 Pharmacological myocardial perfusion imaging study with no significant  Ischemia Small region fixed defect apical and apical anteroseptal region consistent with prior MI  Normal wall motion, EF estimated at 53% No EKG changes concerning for ischemia at peak stress or in recovery. Low risk scan Signed, Dossie Arbour, MD, Ph.D [facility]  Assessment & Plan    Permanent Atrial fibrillation / Atrial flutter  Known LBBB --Remains in Afib with ventricular rate well controlled. Denies palpitations. Not on rate control / BB after Toprol XL discontinued at previous visit for bradycardic rate. Currently, ventricular rate in the 60s and improved from previous 40s. Known LBBB with reduced EF and evaluated by EP for CRT implantation without recommendation for device and given improvement in vitals and sx after stopping BB, as well as that QRS borderline without report of significant sx.  Continue to monitor sx, vitals, and QRS. He remains on OAC with CHA2DS2VASc score of at least  5 (CHF, HTN, agex2, vascular) and recommendation for indefinite OAC to prevent stroke. No reported s/sx of bleeding.  HFrEF 2/2 NICM --Appears euvolemic on exam. Previous echo as above with EF 35-40% on 07/2019. Wt stable. Continue current medications. Escalation of GDMT precluded by hypotension and bradycardic ventricular rate in the past. Continued to encourage oral intake, though under 2L daily and with a heart healthy and low salt diet. Alcohol cessation  advised. As above, seen by EP without recommendation for CRT given improvement in sx/vitals with BB stopped and borderline QRS. Continue to monitor. Will update an annual echo. Continue Entresto and PRN lasix.   Valvular Dz: Mild AR, moderate MR --Denies  any change in sx and reports stable DOE. Will update an echo.  CAD / Aortic atherosclerosis --No CP. Previous Myoview above. Aggressive risk factor modification including alcohol and smoking cessation. On Eliquis in place of ASA.  Continue Crestor.   HLD --As above. LDL previously 38 on 06/2019 and at goal <70. Repeat lipid/liver per PCP. Continue statin.  PAD --Declines symptoms of claudication but does note lower extremity weakness. Continue statin, Eliqiuis in lieu of ASA. Risk factor modification. Continue to follow with Dr. Kirke Corin as needed for vascular dz.  Hypertension --BP improved from previous hypotension. Continue current small dose Entresto and PRN lasix.   COPD / H/o tobacco use  --Recommend follow-up with pulmonology as directed.  Recommend against smoking / second hand smoke.   Hypothyroid --Previous TSH abnormal 10/2019 at 6.853. Follow-up per PCP.  Gout --Denies gout sx. Reports only the occasional flare of his gout.  Anemia / History of (+) FOBT  --Recommend follow-up with hematology and PCP as directed.   Medication changes: None. Eliquis samples and card provided Labs ordered: None Studies / Imaging ordered: Repeat echo.  Disposition: RTC 6 months  Lennon Alstrom, PA-C 07/14/2020, 12:00 PM

## 2020-07-14 NOTE — Patient Instructions (Signed)
Medication Instructions:  Your physician recommends that you continue on your current medications as directed. Please refer to the Current Medication list given to you today.  *If you need a refill on your cardiac medications before your next appointment, please call your pharmacy*   Lab Work: None ordered   Testing/Procedures: Your physician has requested that you have an echocardiogram. Echocardiography is a painless test that uses sound waves to create images of your heart. It provides your doctor with information about the size and shape of your heart and how well your heart's chambers and valves are working. This procedure takes approximately one hour. There are no restrictions for this procedure.   Follow-Up: At [facility], you and your health needs are our priority.  As part of our continuing mission to provide you with exceptional heart care, we have created designated Provider Care Teams.  These Care Teams include your primary Cardiologist (physician) and Advanced Practice Providers (APPs -  Physician Assistants and Nurse Practitioners) who all work together to provide you with the care you need, when you need it.  We recommend signing up for the patient portal called "MyChart".  Sign up information is provided on this After Visit Summary.  MyChart is used to connect with patients for Virtual Visits (Telemedicine).  Patients are able to view lab/test results, encounter notes, upcoming appointments, etc.  Non-urgent messages can be sent to your provider as well.   To learn more about what you can do with MyChart, go to [email or web address].    Your next appointment:   6 month(s)  The format for your next appointment:   In Person  Provider:   You may see Julien Nordmann, MD or one of the following Advanced Practice Providers on your designated Care Team:    Nicolasa Ducking, NP  Eula Listen, PA-C  Marisue Ivan, PA-C  Cadence Benbrook, [place]  Gillian Shields, NP

## 2020-07-26 IMAGING — US ULTRASOUND ABDOMEN LIMITED
1 series · 14 of 25 positions shown · non-contrast
Comparison: 01/24/2015

CLINICAL DATA: Iron overload

EXAM:
ULTRASOUND ABDOMEN LIMITED RIGHT UPPER QUADRANT

[Series 1: ultrasound abdomen limited · 0.17mm/px · 14 of 35 slices shown]
[im 1/35]
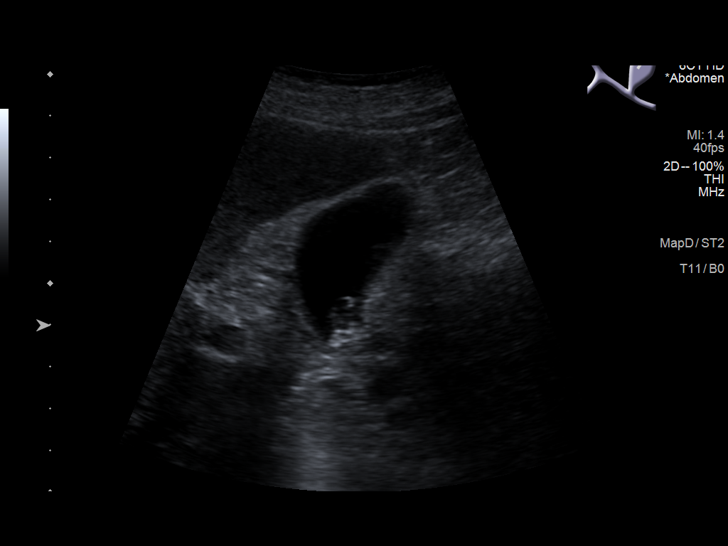
[im 3/35]
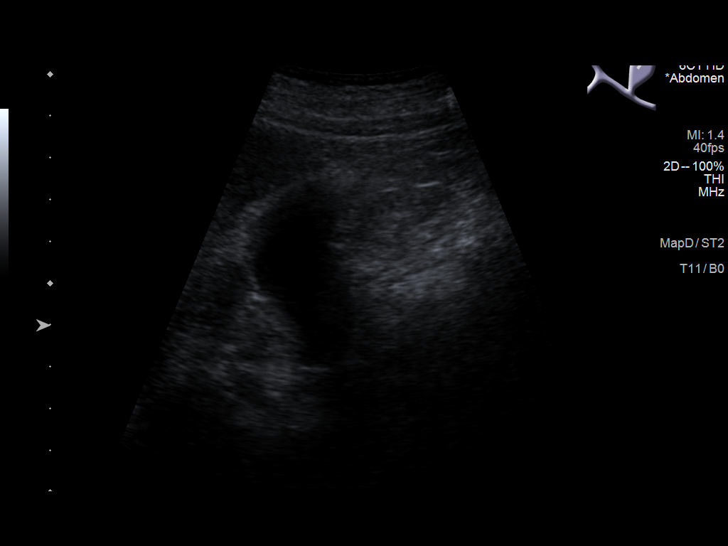
[im 6/35]
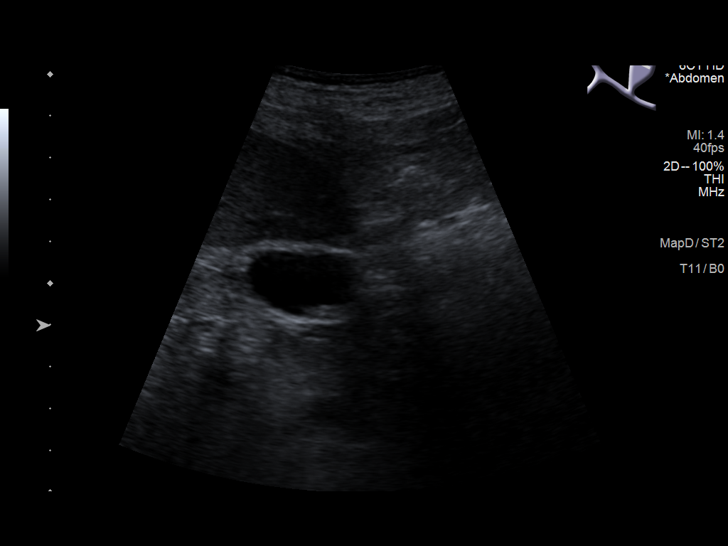
[im 9/35]
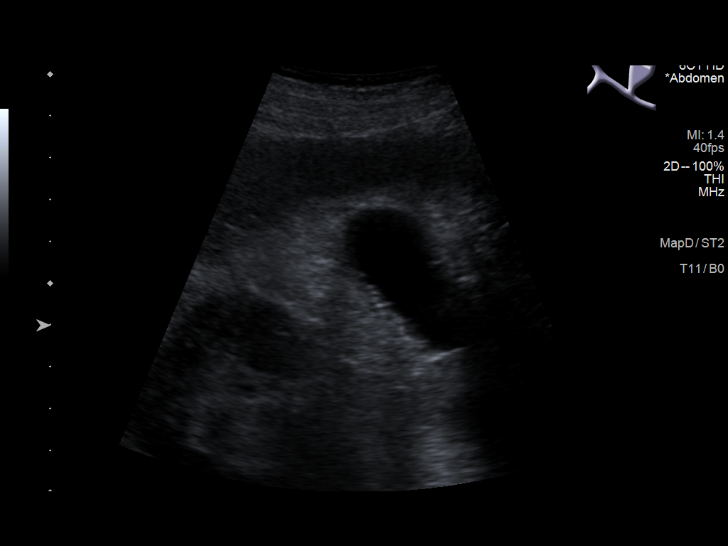
[im 12/35]
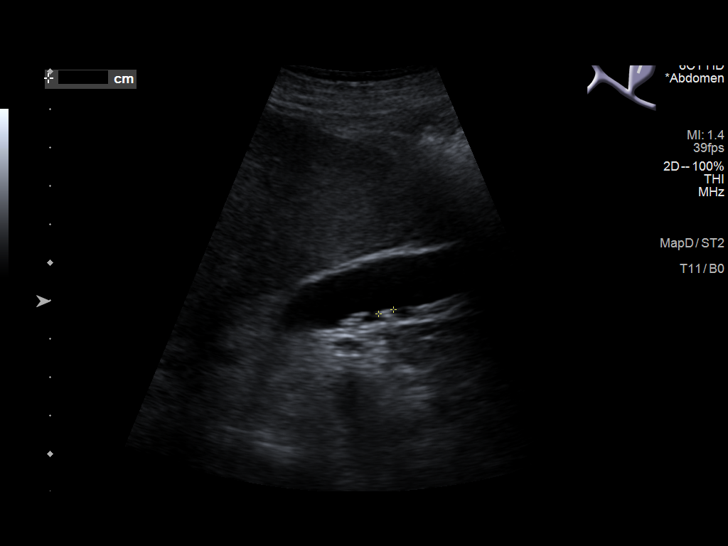
[im 13/35]
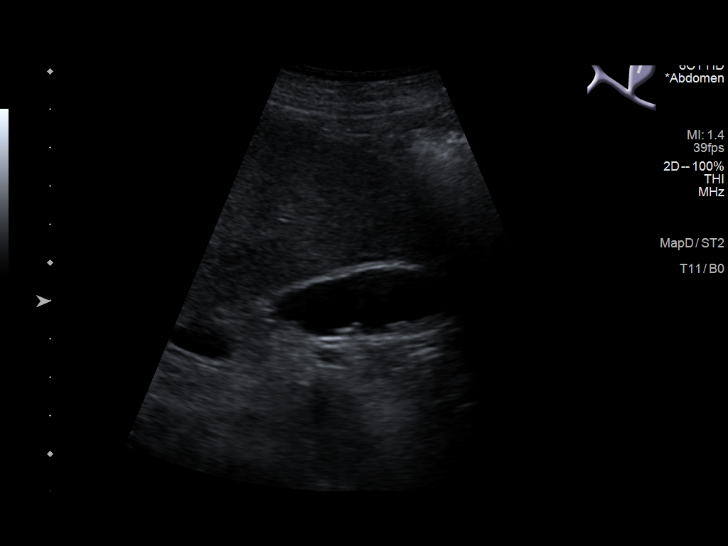
[im 16/35]
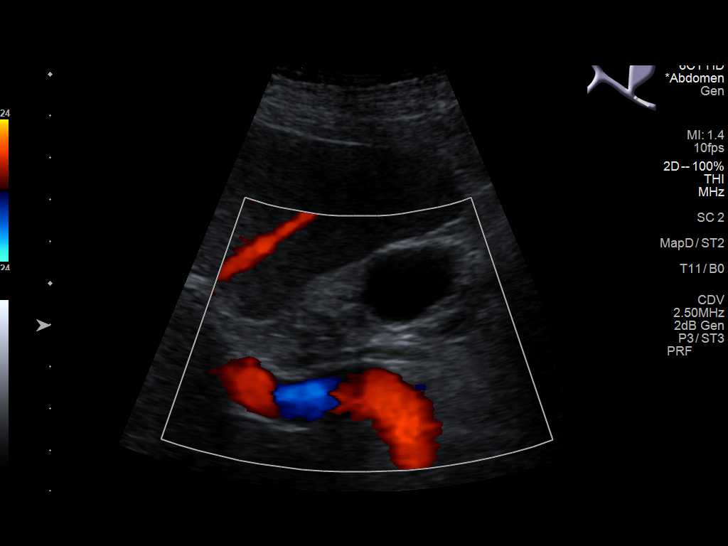
[im 19/35]
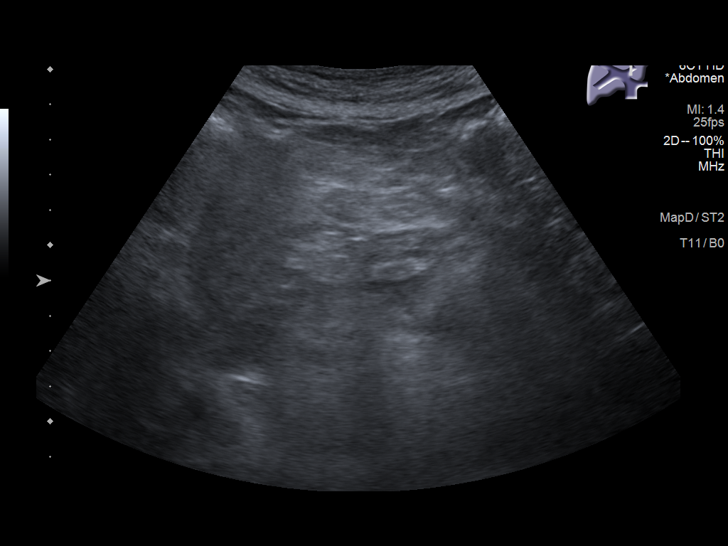
[im 22/35]
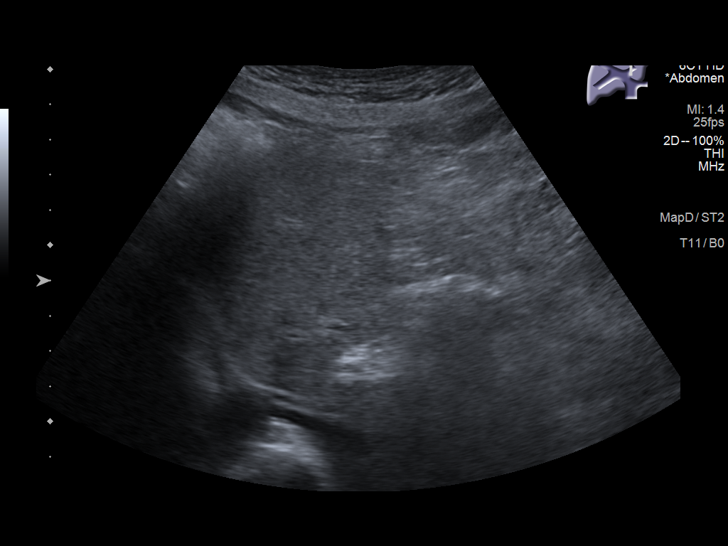
[im 23/35]
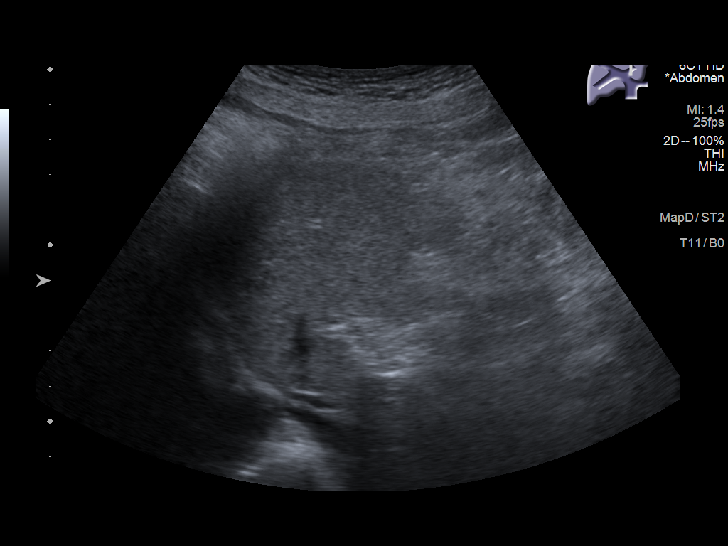
[im 26/35]
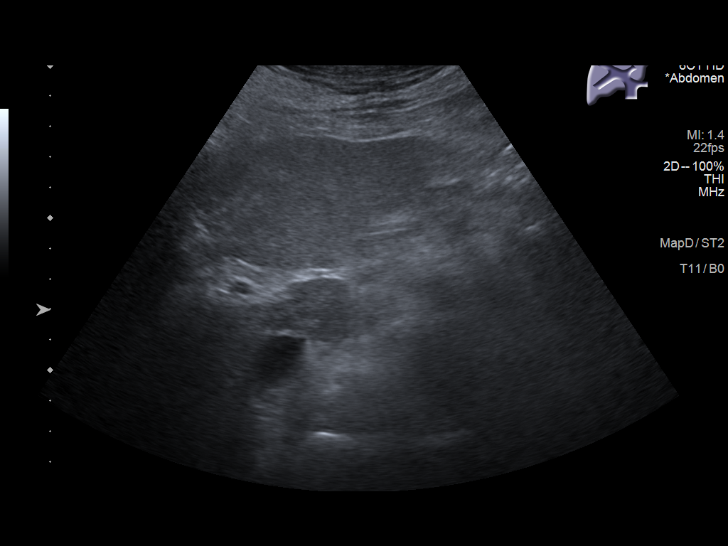
[im 29/35]
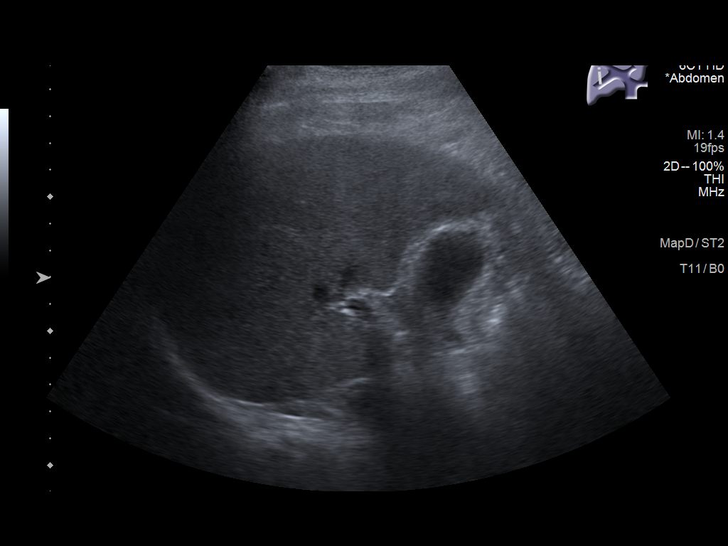
[im 32/35]
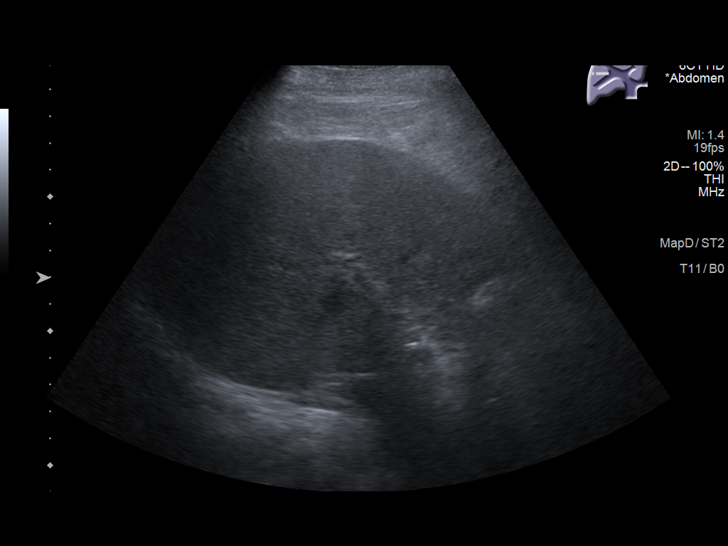
[im 35/35]
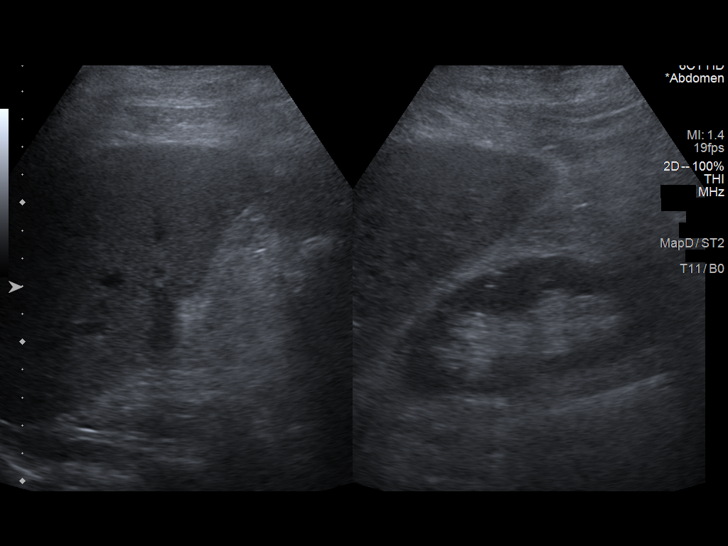

[14 of 25 positions shown; findings below may reference images not displayed]

FINDINGS: Gallbladder:

Small echogenic shadowing and mobile gallstones noted. Normal wall
thickness measuring 2 mm. No Murphy's sign elicited. Stones measure
4 mm or less in size.

Common bile duct:

Diameter: 3 mm

Liver:

Increased hepatic echogenicity suggesting hepatic steatosis, mild.
No large focal hepatic abnormality or intrahepatic biliary
dilatation. Portal vein is patent on color Doppler imaging with
normal direction of blood flow towards the liver.

Other: No free fluid or ascites. Included views the right kidney
demonstrate no hydronephrosis.
IMPRESSION: Cholelithiasis without signs of acute cholecystitis

Mild hepatic steatosis

## 2020-07-28 ENCOUNTER — Other Ambulatory Visit: Payer: Self-pay | Admitting: Family Medicine

## 2020-08-03 ENCOUNTER — Other Ambulatory Visit: Payer: Self-pay

## 2020-08-03 ENCOUNTER — Ambulatory Visit (INDEPENDENT_AMBULATORY_CARE_PROVIDER_SITE_OTHER): Payer: Medicare Other

## 2020-08-03 DIAGNOSIS — I38 Endocarditis, valve unspecified: Secondary | ICD-10-CM | POA: Diagnosis not present

## 2020-08-03 LAB — ECHOCARDIOGRAM COMPLETE
AR max vel: 1.72 cm2
AV Area VTI: 1.79 cm2
AV Area mean vel: 1.68 cm2
AV Mean grad: 4 mmHg
AV Peak grad: 8.9 mmHg
Ao pk vel: 1.49 m/s
Area-P 1/2: 3.2 cm2
P 1/2 time: 520 msec
S' Lateral: 4.05 cm
Single Plane A4C EF: 32.5 %

## 2020-08-05 DIAGNOSIS — M25569 Pain in unspecified knee: Secondary | ICD-10-CM | POA: Diagnosis not present

## 2020-08-06 IMAGING — US US LIVER ELASTOGRAPHY
1 series · 13 of 25 positions shown · non-contrast
Comparison: CT abdomen 01/24/2015

CLINICAL DATA: Elevated ferritin

EXAM:
US LIVER ELASTOGRAPHY
TECHNIQUE: Ultrasound elastography evaluation of the liver was performed. A
region of interest was placed in the right lobe of the liver.
Following application of a compressive sonographic pulse, shear
waves were detected in the adjacent hepatic tissue and the shear
wave velocity was calculated. Multiple assessments were performed at
the selected site. Median shear wave velocity is correlated to a
Metavir fibrosis score.

[Series 1: us liver elastography · 13 of 29 slices shown]
[im 1/29]
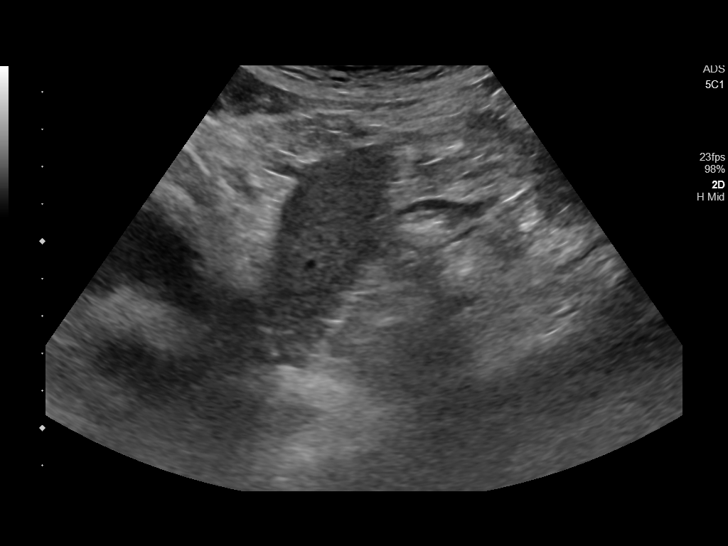
[im 3/29]
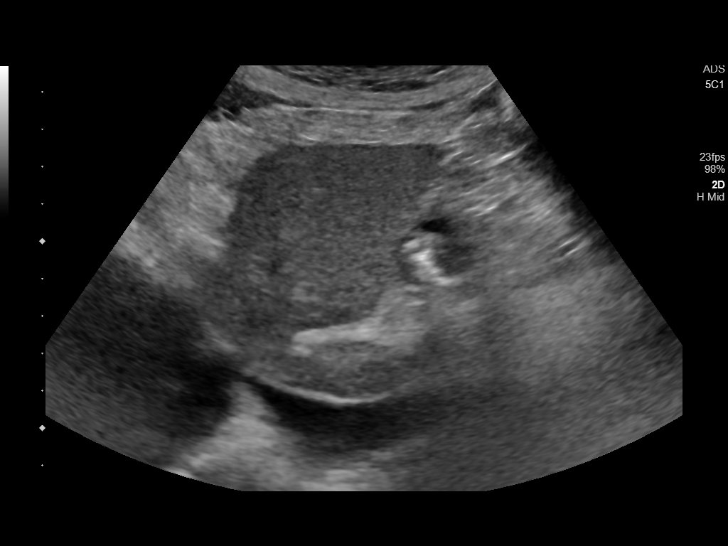
[im 5/29]
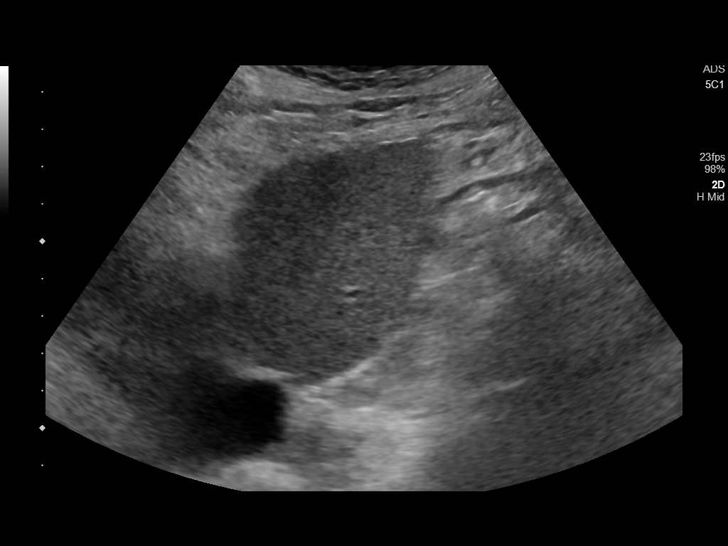
[im 8/29]
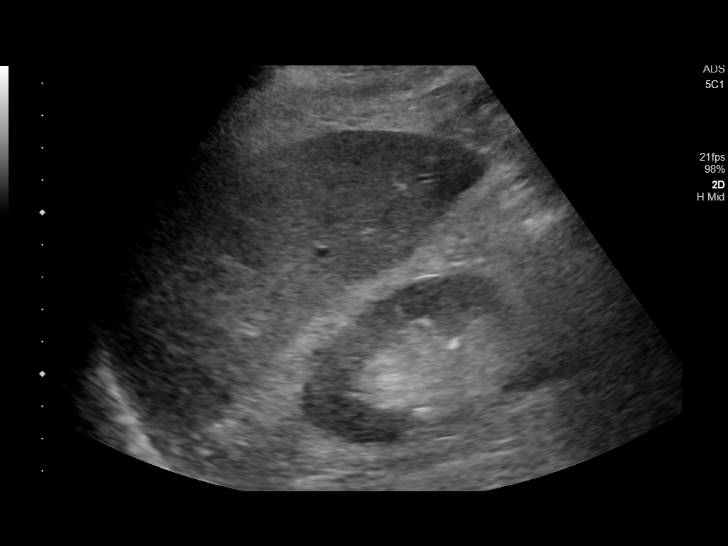
[im 10/29]
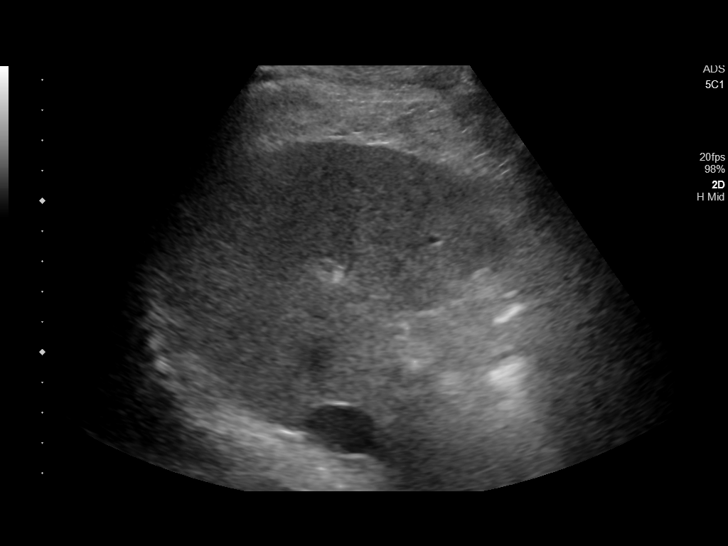
[im 12/29]
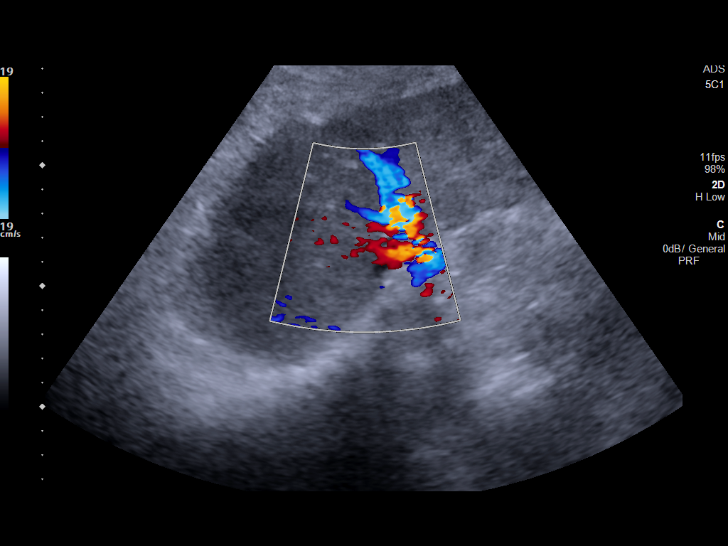
[im 15/29]
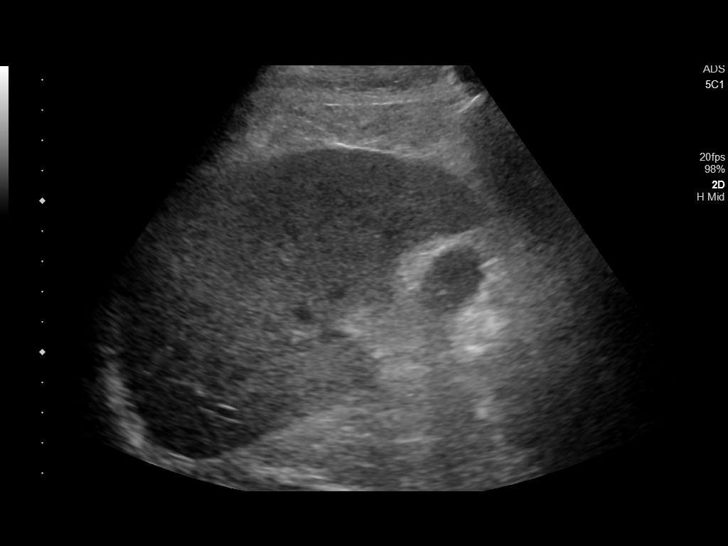
[im 17/29]
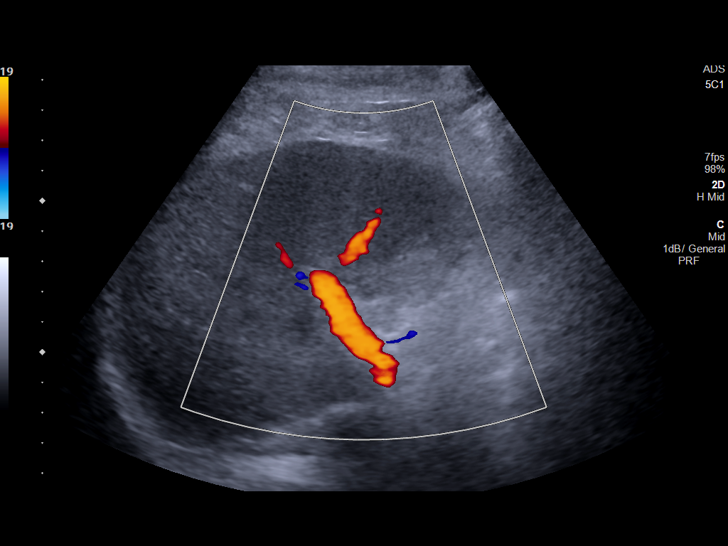
[im 19/29]
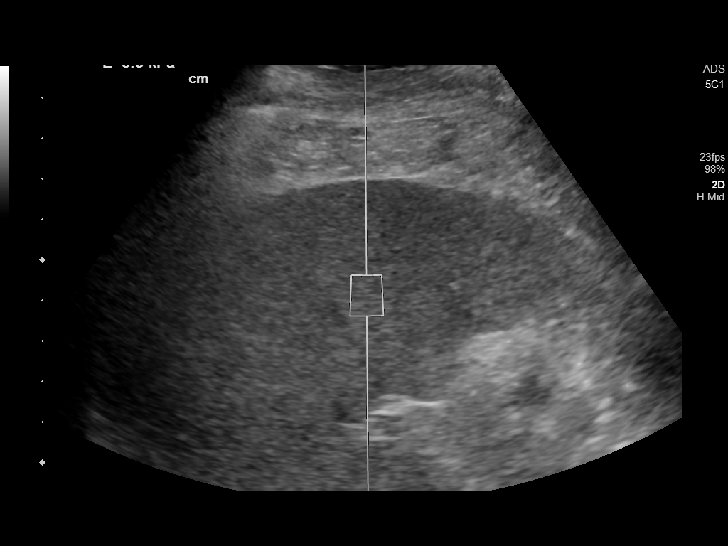
[im 22/29]
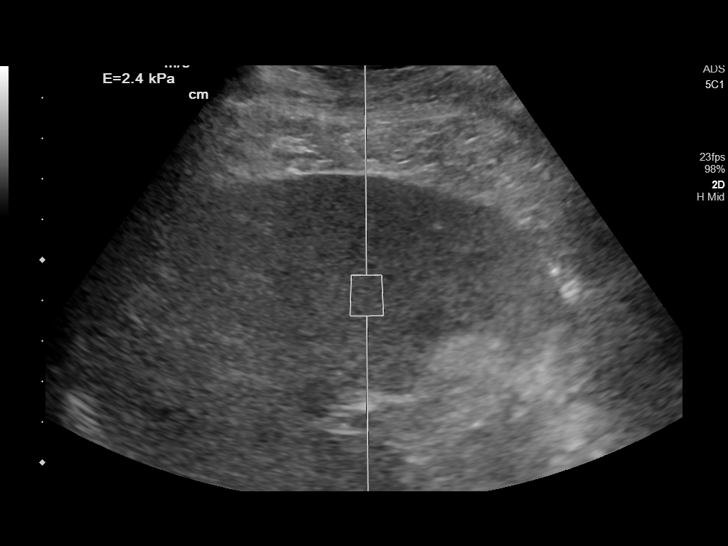
[im 24/29]
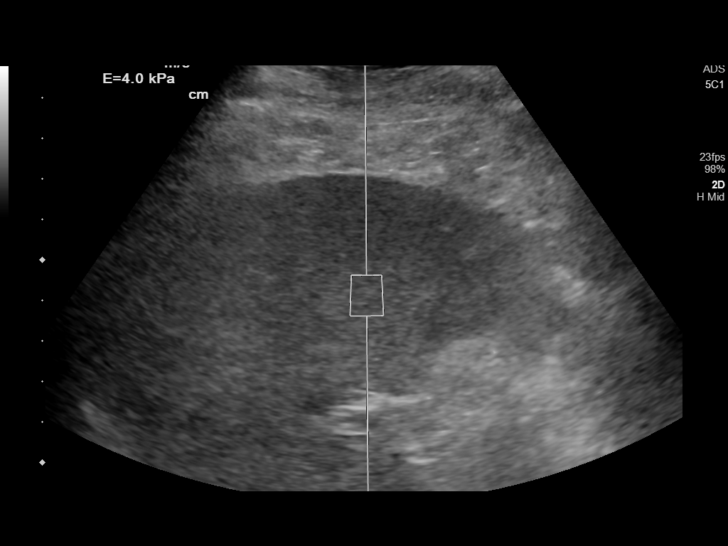
[im 26/29]
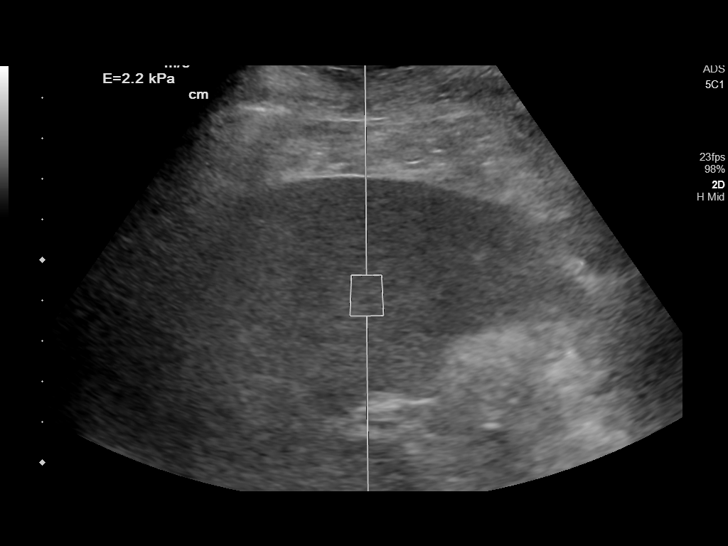
[im 29/29]
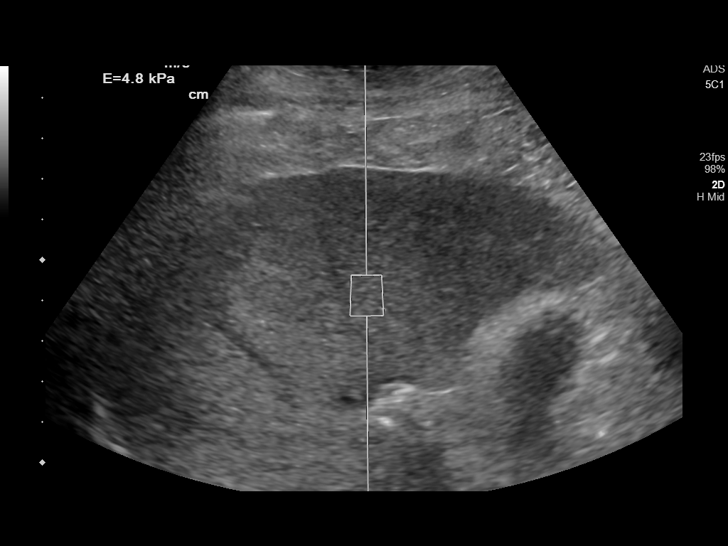

[13 of 25 positions shown; findings below may reference images not displayed]

FINDINGS: Liver: Normal parenchymal echogenicity. No definite hepatic mass or
nodularity. Portal vein is patent on color Doppler imaging with
normal direction of blood flow towards the liver.

ULTRASOUND HEPATIC ELASTOGRAPHY

Device: Siemens Helix VTQ

Patient position: Supine

Transducer: [id number]

Number of measurements: 10

Hepatic segment:  8

Median velocity:   0.98 m/sec

IQR:

IQR/Median velocity ratio:

Corresponding Metavir fibrosis score:  F0/F1

Risk of fibrosis: Minimal

Limitations of exam: None

Please note that abnormal shear wave velocities may also be
identified in clinical settings other than with hepatic fibrosis,
such as: acute hepatitis, elevated right heart and central venous
pressures including use of beta blockers, Jenry disease
(D[place]), infiltrative processes such as
mastocytosis/amyloidosis/infiltrative tumor, extrahepatic
cholestasis, in the post-prandial state, and liver transplantation.
Correlation with patient history, laboratory data, and clinical
condition recommended.
IMPRESSION: Liver: No focal sonographic abnormalities

Elastography: Median hepatic shear wave velocity is calculated at
0.98 m/sec.

Corresponding Metavir fibrosis score is F0/F1.

Risk of fibrosis is Minimal.

Follow-up: None required

## 2020-08-08 ENCOUNTER — Other Ambulatory Visit: Payer: Self-pay | Admitting: Cardiovascular Disease

## 2020-08-10 ENCOUNTER — Telehealth: Payer: Self-pay

## 2020-08-10 DIAGNOSIS — I420 Dilated cardiomyopathy: Secondary | ICD-10-CM

## 2020-08-10 DIAGNOSIS — I5022 Chronic systolic (congestive) heart failure: Secondary | ICD-10-CM

## 2020-08-10 DIAGNOSIS — I5189 Other ill-defined heart diseases: Secondary | ICD-10-CM

## 2020-08-10 DIAGNOSIS — I255 Ischemic cardiomyopathy: Secondary | ICD-10-CM

## 2020-08-10 NOTE — Telephone Encounter (Signed)
Able to reach pt regarding his recent echo, Devin Foots, PA-C had a chance to review his results and advised   "--Pump function slightly down from previous.   --The bottom left chamber of the heart is also not moving as well as it should  --Bottom left chamber larger than it should be.  --Top left chamber of the heart is mildly larger than normal size.  --Mild to moderately leaky aortic valve.   Given his continued low pump function, worse than that of previous pump function and with ongoing poor wall movement of the bottom left chamber of the heart, forwarding to primary cardiologist for review.   Would recommend repeat referral to EP, if agreeable, and for consideration of CRT.  He previously saw EP in 07/2019; however, his pump function is now down from that of previous, and LBBB more pronounced, so recommend repeat referral at this time. "  Devin Ramsey is in agreeance with EP referral, order placed at this time, understands he should expect a call to have schedule. otherwise all questions or concerns were address and no additional concerns at this time. Agreeable to plan, will call back for anything further.

## 2020-08-25 ENCOUNTER — Ambulatory Visit (INDEPENDENT_AMBULATORY_CARE_PROVIDER_SITE_OTHER): Payer: Medicare Other

## 2020-08-25 ENCOUNTER — Other Ambulatory Visit: Payer: Self-pay

## 2020-08-25 DIAGNOSIS — Z0000 Encounter for general adult medical examination without abnormal findings: Secondary | ICD-10-CM | POA: Diagnosis not present

## 2020-08-25 NOTE — Progress Notes (Signed)
Subjective:   Devin Ramsey is a 75 y.o. male who presents for Medicare Annual/Subsequent preventive examination.  Review of Systems: N/A      I connected with the patient today by telephone and verified that I am speaking with the correct person using two identifiers. Location patient: home Location nurse: work Persons participating in the telephone visit: patient, nurse.   I discussed the limitations, risks, security and privacy concerns of performing an evaluation and management service by telephone and the availability of in person appointments. I also discussed with the patient that there may be a patient responsible charge related to this service. The patient expressed understanding and verbally consented to this telephonic visit.        Cardiac Risk Factors include: advanced age (>31men, >33 women);male gender;Other (see comment), Risk factor comments: hyperlipidemia     Objective:    Today's Vitals   There is no height or weight on file to calculate BMI.  Advanced Directives 08/25/2020 10/28/2019 07/28/2019 07/15/2019 01/15/2019 12/18/2018 11/05/2017  Does Patient Have a Medical Advance Directive? No No No No No No No  Would patient like information on creating a medical advance directive? No - Patient declined - No - Patient declined - - No - Patient declined No - Patient declined    Current Medications (verified) Outpatient Encounter Medications as of 08/25/2020  Medication Sig  . clobetasol cream (TEMOVATE) 0.05 % Apply 1 application topically 2 (two) times daily as needed. Use the least amount possible.  Marland Kitchen ELIQUIS 5 MG TABS tablet Take 1 tablet by mouth twice daily  . ENTRESTO 24-26 MG Take 1 tablet by mouth twice daily  . furosemide (LASIX) 20 MG tablet Take 1 tablet (20 mg total) by mouth daily as needed.  . hydrOXYzine (ATARAX/VISTARIL) 10 MG tablet TAKE 1/2 TO 1 (ONE-HALF TO ONE) TABLET BY MOUTH EVERY 8 HOURS AS NEEDED FOR ITCHING  . levothyroxine (SYNTHROID) 75 MCG  tablet Take 1 tablet (75 mcg total) by mouth daily.  . predniSONE (DELTASONE) 10 MG tablet TAKE 2 TABLETS BY MOUTH ONCE DAILY AS NEEDED WITH FOOD FOR GOUT  . rosuvastatin (CRESTOR) 10 MG tablet Take 1 tablet by mouth once daily  . tiotropium (SPIRIVA HANDIHALER) 18 MCG inhalation capsule Place 1 capsule (18 mcg total) into inhaler and inhale daily.  . traMADol (ULTRAM) 50 MG tablet TAKE 2 TABLETS BY MOUTH EVERY 12 HOURS AS NEEDED FOR  KNEE  PAIN  . triamcinolone cream (KENALOG) 0.1 % APPLY TOPICALLY DAILY AS NEEDED  . vitamin B-12 (CYANOCOBALAMIN) 1000 MCG tablet Take 1 tablet (1,000 mcg total) by mouth daily.   No facility-administered encounter medications on file as of 08/25/2020.    Allergies (verified) Codeine and Indocin [indomethacin]   History: Past Medical History:  Diagnosis Date  . Anemia   . B12 deficiency 10/28/2019  . Bilateral pleural effusion   . Chronic systolic CHF (congestive heart failure) (HCC)    a. TTE 2/16: EF of 35-40%, WMA consistent with BBB, normal sized LA; b. TTE 11/16: F of 35-40%, mild concentric LVH, no RWMA, normal LV diastolic function parameters, mild to moderate AI, moderate MR, mildly to moderately dilated left atrium measuring 42 mm  . COPD (chronic obstructive pulmonary disease) (HCC)   . Essential hypertension   . Gout   . HLD (hyperlipidemia)   . Hypothyroidism   . LBBB (left bundle branch block)   . Permanent atrial fibrillation (HCC)    a. CHADS2VSC => 4 (CHF, HTN, age x  1, vascular disease); b. on Eliquis  . Pneumonia 2016  . Tobacco abuse    Past Surgical History:  Procedure Laterality Date  . HEMORRHOID SURGERY    . SHOULDER SURGERY     right shoulder   . TONSILLECTOMY     Family History  Adopted: Yes  Family history unknown: Yes   Social History   Socioeconomic History  . Marital status: Married    Spouse name: Not on file  . Number of children: Not on file  . Years of education: Not on file  . Highest education level:  Not on file  Occupational History  . Not on file  Tobacco Use  . Smoking status: Former Smoker    Packs/day: 1.50    Years: 56.00    Pack years: 84.00    Types: Cigarettes    Quit date: 06/27/2014    Years since quitting: 6.1  . Smokeless tobacco: Never Used  Vaping Use  . Vaping Use: Never used  Substance and Sexual Activity  . Alcohol use: Yes    Alcohol/week: 15.0 standard drinks    Types: 15 Cans of beer per week    Comment: 2-3 beers a day average  . Drug use: No  . Sexual activity: Not Currently  Other Topics Concern  . Not on file  Social History Narrative   Married 1966   From [place] fan   1 son, local   Social Determinants of Health   Financial Resource Strain: Low Risk   . Difficulty of Paying Living Expenses: Not hard at all  Food Insecurity: No Food Insecurity  . Worried About Programme researcher, broadcasting/film/video in the Last Year: Never true  . Ran Out of Food in the Last Year: Never true  Transportation Needs: No Transportation Needs  . Lack of Transportation (Medical): No  . Lack of Transportation (Non-Medical): No  Physical Activity: Inactive  . Days of Exercise per Week: 0 days  . Minutes of Exercise per Session: 0 min  Stress: No Stress Concern Present  . Feeling of Stress : Not at all  Social Connections: Not on file    Tobacco Counseling Counseling given: Not Answered   Clinical Intake:  Pre-visit preparation completed: Yes  Pain : No/denies pain     Nutritional Risks: None Diabetes: No  How often do you need to have someone help you when you read instructions, pamphlets, or other written materials from your doctor or pharmacy?: 1 - Never  Diabetic: No Nutrition Risk Assessment:  Has the patient had any N/V/D within the last 2 months?  No  Does the patient have any non-healing wounds?  No  Has the patient had any unintentional weight loss or weight gain?  No   Diabetes:   Is the patient diabetic?  No  If diabetic, was a CBG obtained  today?  N/A Did the patient bring in their glucometer from home?  N/A How often do you monitor your CBG's? N/A.   Financial Strains and Diabetes Management:  Are you having any financial strains with the device, your supplies or your medication? N/A.  Does the patient want to be seen by Chronic Care Management for management of their diabetes?  N/A Would the patient like to be referred to a Nutritionist or for Diabetic Management?  N/A  Interpreter Needed?: No  Information entered by :: CJohnson, LPN   Activities of Daily Living In your present state of health, do you have any difficulty performing  the following activities: 08/25/2020  Hearing? N  Vision? N  Difficulty concentrating or making decisions? N  Walking or climbing stairs? N  Dressing or bathing? N  Doing errands, shopping? N  Preparing Food and eating ? N  Using the Toilet? N  In the past six months, have you accidently leaked urine? N  Do you have problems with loss of bowel control? N  Managing your Medications? N  Managing your Finances? N  Housekeeping or managing your Housekeeping? N  Some recent data might be hidden    Patient Care Team: [name], MD as PCP - General (Family Medicine) [name] Tollie Pizza, MD as PCP - Cardiology (Cardiology) Antonieta Iba, MD as Consulting Physician (Cardiology) Rickard Patience, MD as Consulting Physician (Oncology)  Indicate any recent Medical Services you may have received from other than [facility] providers in the past year (date may be approximate).     Assessment:   This is a routine wellness examination for [place].  Hearing/Vision screen  Hearing Screening   125Hz  250Hz  500Hz  1000Hz  2000Hz  3000Hz  4000Hz  6000Hz  8000Hz   Right ear:           Left ear:           Vision Screening Comments: Patient gets annual eye exams   Dietary issues and exercise activities discussed: Current Exercise Habits: The patient does not participate in regular exercise at present, Exercise  limited by: None identified  Goals    . Patient Stated     Starting 11/05/2017, I will continue taking medications as prescribed.     . Patient Stated     08/25/2020, I will maintain and continue medications as prescribed.       Depression Screen PHQ 2/9 Scores 08/25/2020 11/16/2018 11/05/2017  PHQ - 2 Score 0 0 0  PHQ- 9 Score 0 - 0    Fall Risk Fall Risk  08/25/2020 11/16/2018 11/05/2017  Falls in the past year? 1 0 No  Number falls in past yr: 0 - -  Injury with Fall? 0 - -  Risk for fall due to : Medication side effect - Impaired balance/gait;Impaired mobility  Follow up Falls evaluation completed;Falls prevention discussed - -    FALL RISK PREVENTION PERTAINING TO THE HOME:  Any stairs in or around the home? Yes  If so, are there any without handrails? No  Home free of loose throw rugs in walkways, pet beds, electrical cords, etc? Yes  Adequate lighting in your home to reduce risk of falls? Yes   ASSISTIVE DEVICES UTILIZED TO PREVENT FALLS:  Life alert? No  Use of a cane, walker or w/c? Yes  Grab bars in the bathroom? No  Shower chair or bench in shower? No  Elevated toilet seat or a handicapped toilet? No   TIMED UP AND GO:  Was the test performed? N/A telephone visit .   Cognitive Function: MMSE - Mini Mental State Exam 08/25/2020 11/05/2017  Not completed: Refused -  Orientation to time - 5  Orientation to Place - 5  Registration - 3  Attention/ Calculation - 0  Recall - 3  Language- name 2 objects - 0  Language- repeat - 1  Language- follow 3 step command - 3  Language- read & follow direction - 0  Write a sentence - 0  Copy design - 0  Total score - 20  Mini Cog  Mini-Cog screen was not completed. Patient declined. Maximum score is 22. A value of 0 denotes this part  of the MMSE was not completed or the patient failed this part of the Mini-Cog screening.       Immunizations Immunization History  Administered Date(s) Administered  . Fluad Quad(high Dose  65+) 02/02/2019, 03/18/2020  . Influenza, High Dose Seasonal PF 02/14/2015  . Influenza,inj,Quad PF,6+ Mos 01/18/2014, 03/07/2016, 02/17/2017, 03/19/2018  . Influenza-Unspecified 02/14/2015  . Pneumococcal Conjugate-13 07/11/2015  . Pneumococcal Polysaccharide-23 06/20/2014  . Pneumococcal-Unspecified 06/20/2014    TDAP status: Due, Education has been provided regarding the importance of this vaccine. Advised may receive this vaccine at local pharmacy or Health Dept. Aware to provide a copy of the vaccination record if obtained from local pharmacy or Health Dept. Verbalized acceptance and understanding.  Flu Vaccine status: Up to date  Pneumococcal vaccine status: Up to date  Covid-19 vaccine status: Declined, Education has been provided regarding the importance of this vaccine but patient still declined. Advised may receive this vaccine at local pharmacy or Health Dept.or vaccine clinic. Aware to provide a copy of the vaccination record if obtained from local pharmacy or Health Dept. Verbalized acceptance and understanding.  Qualifies for Shingles Vaccine? Yes   Zostavax completed No   Shingrix Completed?: No.    Education has been provided regarding the importance of this vaccine. Patient has been advised to call insurance company to determine out of pocket expense if they have not yet received this vaccine. Advised may also receive vaccine at local pharmacy or Health Dept. Verbalized acceptance and understanding.  Screening Tests Health Maintenance  Topic Date Due  . COVID-19 Vaccine (1) 09/10/2020 (Originally 11/16/1950)  . COLONOSCOPY (Pts 45-1278yrs Insurance coverage will need to be confirmed)  08/25/2021 (Originally [birth year]1989)  . TETANUS/TDAP  08/26/2023 (Originally 11/17/1964)  . INFLUENZA VACCINE  12/18/2020  . Hepatitis C Screening  Completed  . PNA vac Low Risk Adult  Completed  . HPV VACCINES  Aged Out    Health Maintenance  There are no preventive care reminders to  display for this patient.  Colorectal cancer screening: declined  Lung Cancer Screening: (Low Dose CT Chest recommended if Age 85-80 years, 30 pack-year currently smoking OR have quit w/in 15 years.) does not qualify.    Additional Screening:  Hepatitis C Screening: does qualify; Completed 12/18/2018  Vision Screening: Recommended annual ophthalmology exams for early detection of glaucoma and other disorders of the eye. Is the patient up to date with their annual eye exam?  Yes  Who is the provider or what is the name of the office in which the patient attends annual eye exams? [facility] If pt is not established with a provider, would they like to be referred to a provider to establish care? No .   Dental Screening: Recommended annual dental exams for proper oral hygiene  Community Resource Referral / Chronic Care Management: CRR required this visit?  No   CCM required this visit?  No      Plan:     I have personally reviewed and noted the following in the patient's chart:   . Medical and social history . Use of alcohol, tobacco or illicit drugs  . Current medications and supplements . Functional ability and status . Nutritional status . Physical activity . Advanced directives . List of other physicians . Hospitalizations, surgeries, and ER visits in previous 12 months . Vitals . Screenings to include cognitive, depression, and falls . Referrals and appointments  In addition, I have reviewed and discussed with patient certain preventive protocols, quality metrics, and best practice recommendations. A  written personalized care plan for preventive services as well as general preventive health recommendations were provided to patient.   Due to this being a telephonic visit, the after visit summary with patients personalized plan was offered to patient via office or my-chart. Patient preferred to pick up at office at next visit or via mychart.   Janalyn Shy,  LPN   11/25/2954

## 2020-08-25 NOTE — Patient Instructions (Signed)
Devin Ramsey , Thank you for taking time to come for your Medicare Wellness Visit. I appreciate your ongoing commitment to your health goals. Please review the following plan we discussed and let me know if I can assist you in the future.   Screening recommendations/referrals: Colonoscopy: declined Recommended yearly ophthalmology/optometry visit for glaucoma screening and checkup Recommended yearly dental visit for hygiene and checkup  Vaccinations: Influenza vaccine: Up to date, completed 03/18/2020, due 12/2020 Pneumococcal vaccine: Completed series Tdap vaccine: decline-insurance Shingles vaccine: due, check with your insurance regarding coverage if interested    Covid-19: declined  Advanced directives: Advance directive discussed with you today. Even though you declined this today please call our office should you change your mind and we can give you the proper paperwork for you to fill out.   Conditions/risks identified: hyperlipidemia   Next appointment: Follow up in one year for your annual wellness visit.   Preventive Care 76 Years and Older, Male Preventive care refers to lifestyle choices and visits with your health care provider that can promote health and wellness. What does preventive care include?  A yearly physical exam. This is also called an annual well check.  Dental exams once or twice a year.  Routine eye exams. Ask your health care provider how often you should have your eyes checked.  Personal lifestyle choices, including:  Daily care of your teeth and gums.  Regular physical activity.  Eating a healthy diet.  Avoiding tobacco and drug use.  Limiting alcohol use.  Practicing safe sex.  Taking low doses of aspirin every day.  Taking vitamin and mineral supplements as recommended by your health care provider. What happens during an annual well check? The services and screenings done by your health care provider during your annual well check will  depend on your age, overall health, lifestyle risk factors, and family history of disease. Counseling  Your health care provider may ask you questions about your:  Alcohol use.  Tobacco use.  Drug use.  Emotional well-being.  Home and relationship well-being.  Sexual activity.  Eating habits.  History of falls.  Memory and ability to understand (cognition).  Work and work Astronomer. Screening  You may have the following tests or measurements:  Height, weight, and BMI.  Blood pressure.  Lipid and cholesterol levels. These may be checked every 5 years, or more frequently if you are over 76 years old.  Skin check.  Lung cancer screening. You may have this screening every year starting at age 76 if you have a 30-pack-year history of smoking and currently smoke or have quit within the past 15 years.  Fecal occult blood test (FOBT) of the stool. You may have this test every year starting at age 76.  Flexible sigmoidoscopy or colonoscopy. You may have a sigmoidoscopy every 5 years or a colonoscopy every 10 years starting at age 76.  Prostate cancer screening. Recommendations will vary depending on your family history and other risks.  Hepatitis C blood test.  Hepatitis B blood test.  Sexually transmitted disease (STD) testing.  Diabetes screening. This is done by checking your blood sugar (glucose) after you have not eaten for a while (fasting). You may have this done every 1-3 years.  Abdominal aortic aneurysm (AAA) screening. You may need this if you are a current or former smoker.  Osteoporosis. You may be screened starting at age 79 if you are at high risk. Talk with your health care provider about your test results, treatment options, and  if necessary, the need for more tests. Vaccines  Your health care provider may recommend certain vaccines, such as:  Influenza vaccine. This is recommended every year.  Tetanus, diphtheria, and acellular pertussis (Tdap,  Td) vaccine. You may need a Td booster every 10 years.  Zoster vaccine. You may need this after age 2.  Pneumococcal 13-valent conjugate (PCV13) vaccine. One dose is recommended after age 70.  Pneumococcal polysaccharide (PPSV23) vaccine. One dose is recommended after age 71. Talk to your health care provider about which screenings and vaccines you need and how often you need them. This information is not intended to replace advice given to you by your health care provider. Make sure you discuss any questions you have with your health care provider. Document Released: 06/02/2015 Document Revised: 01/24/2016 Document Reviewed: 03/07/2015 Elsevier Interactive Patient Education  2017 [place] Prevention in the Home Falls can cause injuries. They can happen to people of all ages. There are many things you can do to make your home safe and to help prevent falls. What can I do on the outside of my home?  Regularly fix the edges of walkways and driveways and fix any cracks.  Remove anything that might make you trip as you walk through a door, such as a raised step or threshold.  Trim any bushes or trees on the path to your home.  Use bright outdoor lighting.  Clear any walking paths of anything that might make someone trip, such as rocks or tools.  Regularly check to see if handrails are loose or broken. Make sure that both sides of any steps have handrails.  Any raised decks and porches should have guardrails on the edges.  Have any leaves, snow, or ice cleared regularly.  Use sand or salt on walking paths during winter.  Clean up any spills in your garage right away. This includes oil or grease spills. What can I do in the bathroom?  Use night lights.  Install grab bars by the toilet and in the tub and shower. Do not use towel bars as grab bars.  Use non-skid mats or decals in the tub or shower.  If you need to sit down in the shower, use a plastic, non-slip  stool.  Keep the floor dry. Clean up any water that spills on the floor as soon as it happens.  Remove soap buildup in the tub or shower regularly.  Attach bath mats securely with double-sided non-slip rug tape.  Do not have throw rugs and other things on the floor that can make you trip. What can I do in the bedroom?  Use night lights.  Make sure that you have a light by your bed that is easy to reach.  Do not use any sheets or blankets that are too big for your bed. They should not hang down onto the floor.  Have a firm chair that has side arms. You can use this for support while you get dressed.  Do not have throw rugs and other things on the floor that can make you trip. What can I do in the kitchen?  Clean up any spills right away.  Avoid walking on wet floors.  Keep items that you use a lot in easy-to-reach places.  If you need to reach something above you, use a strong step stool that has a grab bar.  Keep electrical cords out of the way.  Do not use floor polish or wax that makes floors slippery. If you  must use wax, use non-skid floor wax.  Do not have throw rugs and other things on the floor that can make you trip. What can I do with my stairs?  Do not leave any items on the stairs.  Make sure that there are handrails on both sides of the stairs and use them. Fix handrails that are broken or loose. Make sure that handrails are as long as the stairways.  Check any carpeting to make sure that it is firmly attached to the stairs. Fix any carpet that is loose or worn.  Avoid having throw rugs at the top or bottom of the stairs. If you do have throw rugs, attach them to the floor with carpet tape.  Make sure that you have a light switch at the top of the stairs and the bottom of the stairs. If you do not have them, ask someone to add them for you. What else can I do to help prevent falls?  Wear shoes that:  Do not have high heels.  Have rubber bottoms.  Are  comfortable and fit you well.  Are closed at the toe. Do not wear sandals.  If you use a stepladder:  Make sure that it is fully opened. Do not climb a closed stepladder.  Make sure that both sides of the stepladder are locked into place.  Ask someone to hold it for you, if possible.  Clearly mark and make sure that you can see:  Any grab bars or handrails.  First and last steps.  Where the edge of each step is.  Use tools that help you move around (mobility aids) if they are needed. These include:  Canes.  Walkers.  Scooters.  Crutches.  Turn on the lights when you go into a dark area. Replace any light bulbs as soon as they burn out.  Set up your furniture so you have a clear path. Avoid moving your furniture around.  If any of your floors are uneven, fix them.  If there are any pets around you, be aware of where they are.  Review your medicines with your doctor. Some medicines can make you feel dizzy. This can increase your chance of falling. Ask your doctor what other things that you can do to help prevent falls. This information is not intended to replace advice given to you by your health care provider. Make sure you discuss any questions you have with your health care provider. Document Released: 03/02/2009 Document Revised: 10/12/2015 Document Reviewed: 06/10/2014 Elsevier Interactive Patient Education  2017 [employer].

## 2020-08-25 NOTE — Progress Notes (Signed)
PCP notes:  Health Maintenance: Covid- declined Colonoscopy- declined   Abnormal Screenings: none   Patient concerns: none   Nurse concerns: none   Next PCP appt.: none

## 2020-08-31 ENCOUNTER — Telehealth: Payer: Self-pay | Admitting: *Deleted

## 2020-08-31 ENCOUNTER — Encounter: Payer: Self-pay | Admitting: *Deleted

## 2020-08-31 ENCOUNTER — Other Ambulatory Visit: Payer: Self-pay | Admitting: *Deleted

## 2020-08-31 NOTE — Telephone Encounter (Signed)
Spoke to patient via telephone. Rescheduled his Lung Screening CT Scan for 10/04/20 @ 10:00 am. Patient was previously scheduled and was a no show in 05/2020. Will mail confirmation letter today. Patient was instructed to call with any questions, or if he needs to cancel or reschedule appt. Patient verbalized understanding.

## 2020-09-05 ENCOUNTER — Other Ambulatory Visit: Payer: Self-pay | Admitting: Family Medicine

## 2020-09-05 DIAGNOSIS — M25569 Pain in unspecified knee: Secondary | ICD-10-CM | POA: Diagnosis not present

## 2020-09-06 ENCOUNTER — Institutional Professional Consult (permissible substitution): Payer: Medicare Other | Admitting: Cardiology

## 2020-09-06 NOTE — Telephone Encounter (Signed)
Refill request for Tramadol 50 mg tablets  LOV - 11/11/19 Next OV - 10/27/20 Last refill - 12/21/19 #120/1

## 2020-09-13 ENCOUNTER — Other Ambulatory Visit: Payer: Self-pay | Admitting: Family Medicine

## 2020-09-19 ENCOUNTER — Other Ambulatory Visit: Payer: Self-pay | Admitting: Family Medicine

## 2020-09-20 NOTE — Telephone Encounter (Signed)
Refill request for Prednisone 10 mg tablets for gout flares.  LOV - 11/11/19 Next OV - 10/28/19 Last refill - 05/15/20 #20/0

## 2020-09-20 NOTE — Telephone Encounter (Signed)
Sent. Thanks.   

## 2020-09-26 ENCOUNTER — Other Ambulatory Visit: Payer: Self-pay | Admitting: Cardiovascular Disease

## 2020-10-04 ENCOUNTER — Ambulatory Visit: Admission: RE | Admit: 2020-10-04 | Payer: Medicare Other | Source: Ambulatory Visit

## 2020-10-04 ENCOUNTER — Inpatient Hospital Stay: Payer: Medicare Other | Admitting: Hospice and Palliative Medicine

## 2020-10-05 ENCOUNTER — Encounter: Payer: Self-pay | Admitting: Internal Medicine

## 2020-10-05 ENCOUNTER — Ambulatory Visit (INDEPENDENT_AMBULATORY_CARE_PROVIDER_SITE_OTHER): Payer: Medicare Other | Admitting: Internal Medicine

## 2020-10-05 ENCOUNTER — Other Ambulatory Visit: Payer: Self-pay

## 2020-10-05 VITALS — BP 120/66 | HR 57 | Ht 70.0 in | Wt 154.0 lb

## 2020-10-05 DIAGNOSIS — I4821 Permanent atrial fibrillation: Secondary | ICD-10-CM

## 2020-10-05 DIAGNOSIS — M25569 Pain in unspecified knee: Secondary | ICD-10-CM | POA: Diagnosis not present

## 2020-10-05 DIAGNOSIS — I428 Other cardiomyopathies: Secondary | ICD-10-CM

## 2020-10-05 DIAGNOSIS — I5022 Chronic systolic (congestive) heart failure: Secondary | ICD-10-CM | POA: Diagnosis not present

## 2020-10-05 DIAGNOSIS — I447 Left bundle-branch block, unspecified: Secondary | ICD-10-CM | POA: Diagnosis not present

## 2020-10-05 DIAGNOSIS — I493 Ventricular premature depolarization: Secondary | ICD-10-CM

## 2020-10-05 NOTE — Patient Instructions (Signed)
Medication Instructions:  - Your physician recommends that you continue on your current medications as directed. Please refer to the Current Medication list given to you today.  *If you need a refill on your cardiac medications before your next appointment, please call your pharmacy*   Lab Work: - none ordered  If you have labs (blood work) drawn today and your tests are completely normal, you will receive your results only by: . MyChart Message (if you have MyChart) OR . A paper copy in the mail If you have any lab test that is abnormal or we need to change your treatment, we will call you to review the results.   Testing/Procedures: - none ordered   Follow-Up: At [facility], you and your health needs are our priority.  As part of our continuing mission to provide you with exceptional heart care, we have created designated Provider Care Teams.  These Care Teams include your primary Cardiologist (physician) and Advanced Practice Providers (APPs -  Physician Assistants and Nurse Practitioners) who all work together to provide you with the care you need, when you need it.  We recommend signing up for the patient portal called "MyChart".  Sign up information is provided on this After Visit Summary.  MyChart is used to connect with patients for Virtual Visits (Telemedicine).  Patients are able to view lab/test results, encounter notes, upcoming appointments, etc.  Non-urgent messages can be sent to your provider as well.   To learn more about what you can do with MyChart, go to [email or web address].    Your next appointment:   As needed   The format for your next appointment:   In Person  Provider:   Steven Klein, MD   Other Instructions n/a  

## 2020-10-05 NOTE — Progress Notes (Signed)
Patient Care Team: [name], MD as PCP - General (Family Medicine) [name] Tollie Pizza, MD as PCP - Cardiology (Cardiology) Antonieta Iba, MD as Consulting Physician (Cardiology) Rickard Patience, MD as Consulting Physician (Oncology)   HPI  Devin Ramsey is a 75 y.o. male with a prior history of bradycardia referred for consideration of CRT-D having been referred previously for backup bradycardia pacing which was ameliorated by the discontinuation of his beta-blockers.  His major limitation is his legs.  He denies shortness of breath with his activities including climbing stairs carrying groceries.  He has had no syncope.  Hx of afib/flutter-permanent.  Anticoagulation with apixaban without significant bleeding   LBBB (QRSd--130-140) w chronic cardiomyopathy presumed to be ischemic based on an abnormal Myoview but he has declined catheterization in the past.  He continues to drink, admitting to 6-7 beers a day.  He has COPD  Extensive vascular disease manifested by lower extremity occlusive disease, extensive calcification and aortic calcification DATE TEST EF   2/16 Echo   35-40 %   1/20 Echo   34-40 %   1/20 MYOVIEW 53% Fixed defect-anteroapical   3/22 Echo 30-35% (SUBOPTIMAL VIEW)   Date Cr K TSH Hgb  6.21      12/21 1.37 4.6  12.2              Thromboembolic risk factors ( age  -2, HTN-1 vascular disease-1 CHF-1) for a CHADSVASc Score of >=5   Records and Results Reviewed (As above)   Past Medical History:  Diagnosis Date  . Anemia   . B12 deficiency 10/28/2019  . Bilateral pleural effusion   . Chronic systolic CHF (congestive heart failure) (HCC)    a. TTE 2/16: EF of 35-40%, WMA consistent with BBB, normal sized LA; b. TTE 11/16: F of 35-40%, mild concentric LVH, no RWMA, normal LV diastolic function parameters, mild to moderate AI, moderate MR, mildly to moderately dilated left atrium measuring 42 mm  . COPD (chronic obstructive pulmonary  disease) (HCC)   . Essential hypertension   . Gout   . HLD (hyperlipidemia)   . Hypothyroidism   . LBBB (left bundle branch block)   . Permanent atrial fibrillation (HCC)    a. CHADS2VSC => 4 (CHF, HTN, age x 1, vascular disease); b. on Eliquis  . Pneumonia 2016  . Tobacco abuse     Past Surgical History:  Procedure Laterality Date  . HEMORRHOID SURGERY    . SHOULDER SURGERY     right shoulder   . TONSILLECTOMY      Current Meds  Medication Sig  . clobetasol cream (TEMOVATE) 0.05 % Apply 1 application topically 2 (two) times daily as needed. Use the least amount possible.  Marland Kitchen ELIQUIS 5 MG TABS tablet Take 1 tablet by mouth twice daily  . ENTRESTO 24-26 MG Take 1 tablet by mouth twice daily  . furosemide (LASIX) 20 MG tablet Take 1 tablet (20 mg total) by mouth daily as needed.  . hydrOXYzine (ATARAX/VISTARIL) 10 MG tablet TAKE 1/2 TO 1 (ONE-HALF TO ONE) TABLET BY MOUTH EVERY 8 HOURS AS NEEDED FOR ITCHING  . levothyroxine (SYNTHROID) 75 MCG tablet Take 1 tablet (75 mcg total) by mouth daily.  . predniSONE (DELTASONE) 10 MG tablet TAKE 2 TABLETS BY MOUTH ONCE DAILY AS NEEDED WITH FOOD FOR GOUT  . rosuvastatin (CRESTOR) 10 MG tablet Take 1 tablet by mouth once daily  . tiotropium (SPIRIVA HANDIHALER) 18 MCG inhalation capsule Place  1 capsule (18 mcg total) into inhaler and inhale daily.  . traMADol (ULTRAM) 50 MG tablet TAKE 2 TABLETS BY MOUTH EVERY 12 HOURS AS NEEDED FOR  KNEE  PAIN  . triamcinolone cream (KENALOG) 0.1 % APPLY TOPICALLY DAILY AS NEEDED  . vitamin B-12 (CYANOCOBALAMIN) 1000 MCG tablet Take 1 tablet (1,000 mcg total) by mouth daily.    Allergies  Allergen Reactions  . Codeine     Sedation.  Tolerates other opiates.    . Indocin [Indomethacin]     Elevated BP, would avoid given his h/o CHF      Review of Systems negative except from HPI and PMH  Physical Exam BP 120/66   Pulse (!) 57   Ht 5\' 10"  (1.778 m)   Wt 154 lb (69.9 kg)   BMI 22.10 kg/m  Well  developed and well nourished in no acute distress HENT normal E scleral and icterus clear Neck Supple JVP <8;   Clear to ausculation Regular rate and rhythm, no murmurs gallops or rub Soft with active bowel sounds No clubbing cyanosis none Edema Alert and oriented, grossly normal motor and sensory function Skin Warm and Dry  ECG atrial flutter with 4: 1 conduction  CrCl cannot be calculated (Patient's most recent lab result is older than the maximum 21 days allowed.).   Assessment and  Plan  Atrial fibrillation/flutter-permanent  Left bundle branch block-QRS duration 130-140  Coronary artery disease with prior MI (Myoview)  Bradycardia resolved off of beta-blockers  Copd  Alcohol intake exuberant  Cardiomyopathy-nonischemic    The patient has persistent cardiomyopathy although the severity of it may be difficult to judge based on the poor windows described in his cMRI suggested a significantly better ejection fraction and the more recent echo.  The latter would suggest the potential benefit of resuming spironolactone which had been discontinued about 5 years ago.  We will start him back on 12.5 and will relay that message to his primary cardiologist.  His cardiomyopathy may be aggravated by his significant alcohol intake.  This apparently is not going to change.  His atrial arrhythmia is longstanding and his left atrial size is consequentially enlarged.  I think it is appropriate to consider a permanent as listed.  Anticoagulation is indicated.  We will continue him on 5 mg twice daily.  His symptoms are largely related to his orthopedic issues and not to his breathing.  Given the relative narrowness of his QRS and the equivocal nature of his LV dysfunction (see above) and his disinclination towards device implantation we have decided to not pursue such.  His age also makes the potential benefit of ICD questionable particularly to the degree that his myopathy is nonischemic  versus ischemic  The time spend this day on the patients care was 41 min.  Current medicines are reviewed at length with the patient today .  The patient does not  have concerns regarding medicines.

## 2020-10-12 NOTE — Addendum Note (Signed)
Addended by: Festus Aloe on: 10/12/2020 10:49 AM   Modules accepted: Orders

## 2020-10-23 ENCOUNTER — Other Ambulatory Visit: Payer: Medicare Other

## 2020-10-25 ENCOUNTER — Ambulatory Visit: Payer: Medicare Other | Admitting: Oncology

## 2020-10-25 ENCOUNTER — Ambulatory Visit: Payer: Medicare Other

## 2020-10-27 ENCOUNTER — Encounter: Payer: Medicare Other | Admitting: Family Medicine

## 2020-11-03 ENCOUNTER — Inpatient Hospital Stay: Payer: Medicare Other | Attending: Oncology

## 2020-11-03 DIAGNOSIS — E538 Deficiency of other specified B group vitamins: Secondary | ICD-10-CM

## 2020-11-03 DIAGNOSIS — F1090 Alcohol use, unspecified, uncomplicated: Secondary | ICD-10-CM

## 2020-11-03 DIAGNOSIS — Z7289 Other problems related to lifestyle: Secondary | ICD-10-CM | POA: Insufficient documentation

## 2020-11-03 DIAGNOSIS — Z148 Genetic carrier of other disease: Secondary | ICD-10-CM

## 2020-11-03 LAB — CBC WITH DIFFERENTIAL/PLATELET
Abs Immature Granulocytes: 0.01 10*3/uL (ref 0.00–0.07)
Basophils Absolute: 0.1 10*3/uL (ref 0.0–0.1)
Basophils Relative: 2 %
Eosinophils Absolute: 0.1 10*3/uL (ref 0.0–0.5)
Eosinophils Relative: 3 %
HCT: 37.4 % — ABNORMAL LOW (ref 39.0–52.0)
Hemoglobin: 13.6 g/dL (ref 13.0–17.0)
Immature Granulocytes: 0 %
Lymphocytes Relative: 32 %
Lymphs Abs: 1.3 10*3/uL (ref 0.7–4.0)
MCH: 34.6 pg — ABNORMAL HIGH (ref 26.0–34.0)
MCHC: 36.4 g/dL — ABNORMAL HIGH (ref 30.0–36.0)
MCV: 95.2 fL (ref 80.0–100.0)
Monocytes Absolute: 0.4 10*3/uL (ref 0.1–1.0)
Monocytes Relative: 9 %
Neutro Abs: 2.3 10*3/uL (ref 1.7–7.7)
Neutrophils Relative %: 54 %
Platelets: 132 10*3/uL — ABNORMAL LOW (ref 150–400)
RBC: 3.93 MIL/uL — ABNORMAL LOW (ref 4.22–5.81)
RDW: 13 % (ref 11.5–15.5)
WBC: 4.1 10*3/uL (ref 4.0–10.5)
nRBC: 0 % (ref 0.0–0.2)

## 2020-11-03 LAB — HEPATIC FUNCTION PANEL
ALT: 11 U/L (ref 0–44)
AST: 20 U/L (ref 15–41)
Albumin: 3.6 g/dL (ref 3.5–5.0)
Alkaline Phosphatase: 113 U/L (ref 38–126)
Bilirubin, Direct: 0.4 mg/dL — ABNORMAL HIGH (ref 0.0–0.2)
Indirect Bilirubin: 0.9 mg/dL (ref 0.3–0.9)
Total Bilirubin: 1.3 mg/dL — ABNORMAL HIGH (ref 0.3–1.2)
Total Protein: 6.1 g/dL — ABNORMAL LOW (ref 6.5–8.1)

## 2020-11-03 LAB — VITAMIN B12: Vitamin B-12: 435 pg/mL (ref 180–914)

## 2020-11-05 DIAGNOSIS — M25569 Pain in unspecified knee: Secondary | ICD-10-CM | POA: Diagnosis not present

## 2020-11-06 ENCOUNTER — Inpatient Hospital Stay: Payer: Medicare Other

## 2020-11-06 ENCOUNTER — Inpatient Hospital Stay: Payer: Medicare Other | Admitting: Oncology

## 2020-11-07 ENCOUNTER — Other Ambulatory Visit: Payer: Self-pay | Admitting: Family Medicine

## 2020-11-08 NOTE — Telephone Encounter (Signed)
Sent. Thanks.   

## 2020-11-13 ENCOUNTER — Telehealth: Payer: Self-pay

## 2020-11-13 NOTE — Telephone Encounter (Signed)
No need to schedule pt to see NP. He is scheduled to see MD on 7/20. Keep appts as is.

## 2020-11-13 NOTE — Telephone Encounter (Signed)
Hey Can we please schedule for follow up with NP per MD. Please notify patient as well. Thank you.

## 2020-11-13 NOTE — Telephone Encounter (Signed)
-----   Message from Guerry Minors, CMA sent at 11/13/2020  8:34 AM EDT -----  ----- Message ----- From: Rickard Patience, MD Sent: 11/11/2020   4:31 PM EDT To: Coralee Rud, RN, Guerry Minors, CMA  He had labs done but seems no follow up please ask schedule him to see Np thanks.

## 2020-11-23 ENCOUNTER — Ambulatory Visit (INDEPENDENT_AMBULATORY_CARE_PROVIDER_SITE_OTHER): Payer: Medicare Other | Admitting: Family Medicine

## 2020-11-23 ENCOUNTER — Encounter: Payer: Self-pay | Admitting: Family Medicine

## 2020-11-23 ENCOUNTER — Other Ambulatory Visit: Payer: Self-pay

## 2020-11-23 ENCOUNTER — Telehealth: Payer: Self-pay

## 2020-11-23 VITALS — BP 124/82 | HR 65 | Temp 97.6°F | Ht 70.0 in | Wt 148.0 lb

## 2020-11-23 DIAGNOSIS — Z636 Dependent relative needing care at home: Secondary | ICD-10-CM

## 2020-11-23 DIAGNOSIS — F17200 Nicotine dependence, unspecified, uncomplicated: Secondary | ICD-10-CM

## 2020-11-23 DIAGNOSIS — E039 Hypothyroidism, unspecified: Secondary | ICD-10-CM

## 2020-11-23 DIAGNOSIS — I10 Essential (primary) hypertension: Secondary | ICD-10-CM | POA: Diagnosis not present

## 2020-11-23 DIAGNOSIS — Z7189 Other specified counseling: Secondary | ICD-10-CM

## 2020-11-23 DIAGNOSIS — J449 Chronic obstructive pulmonary disease, unspecified: Secondary | ICD-10-CM

## 2020-11-23 DIAGNOSIS — E782 Mixed hyperlipidemia: Secondary | ICD-10-CM

## 2020-11-23 DIAGNOSIS — Z0000 Encounter for general adult medical examination without abnormal findings: Secondary | ICD-10-CM

## 2020-11-23 DIAGNOSIS — L299 Pruritus, unspecified: Secondary | ICD-10-CM

## 2020-11-23 DIAGNOSIS — M109 Gout, unspecified: Secondary | ICD-10-CM | POA: Diagnosis not present

## 2020-11-23 DIAGNOSIS — I5022 Chronic systolic (congestive) heart failure: Secondary | ICD-10-CM | POA: Diagnosis not present

## 2020-11-23 DIAGNOSIS — E538 Deficiency of other specified B group vitamins: Secondary | ICD-10-CM

## 2020-11-23 LAB — LIPID PANEL
Cholesterol: 107 mg/dL (ref 0–200)
HDL: 51.7 mg/dL (ref >39.00)
LDL Cholesterol: 45 mg/dL (ref 0–99)
NonHDL: 55.41
Total CHOL/HDL Ratio: 2
Triglycerides: 51 mg/dL (ref 0.0–149.0)
VLDL: 10.2 mg/dL (ref 0.0–40.0)

## 2020-11-23 LAB — BASIC METABOLIC PANEL
BUN: 11 mg/dL (ref 6–23)
CO2: 26 mEq/L (ref 19–32)
Calcium: 8.5 mg/dL (ref 8.4–10.5)
Chloride: 96 mEq/L (ref 96–112)
Creatinine, Ser: 1.15 mg/dL (ref 0.40–1.50)
GFR: 62.03 mL/min (ref >60.00)
Glucose, Bld: 94 mg/dL (ref 70–99)
Potassium: 4.5 mEq/L (ref 3.5–5.1)
Sodium: 129 mEq/L — ABNORMAL LOW (ref 135–145)

## 2020-11-23 LAB — TSH: TSH: 9.75 u[IU]/mL — ABNORMAL HIGH (ref 0.35–5.50)

## 2020-11-23 LAB — URIC ACID: Uric Acid, Serum: 5.6 mg/dL (ref 4.0–7.8)

## 2020-11-23 MED ORDER — HYDROXYZINE HCL 10 MG PO TABS: ORAL_TABLET | ORAL | 5 refills | Status: DC

## 2020-11-23 NOTE — Patient Instructions (Addendum)
Go to the lab on the way out.   If you have mychart we'll likely use that to update you.    °Take care.  Glad to see you. °Update me as needed.   °

## 2020-11-23 NOTE — Progress Notes (Signed)
This visit occurred during the SARS-CoV-2 public health emergency.  Safety protocols were in place, including screening questions prior to the visit, additional usage of staff PPE, and extensive cleaning of exam room while observing appropriate contact time as indicated for disinfecting solutions.  Wife with memory loss.  He is caring for her.  D/w pt about SW referral.  She repeats herself a lot.  Referral placed.  Hypertension:    Using medication without problems or lightheadedness: yes Chest pain with exertion:no Edema:no Short of breath: only with sig exertion and that is at baseline.   Still anticoagulated.  No bleeding.  Taking lasix daily.   Hydroxyzine used prn for itching.  No ADE on med.  It helps.  Rx sent.    Hypothyroidism.  Compliant.  TSH pending.  See notes on labs.  Taking 75 mcg levothyroxine daily.  Gout.  Prn prednisone, not in the last recent period of time.  No current use.  No current flare.  COPD.  Has rx for spiriva, but only using PRN.  Not SOB.  Minimal wheeze.  Discussed with patient about inhaler use.  Elevated Cholesterol: Using medications without problems:yes Muscle aches: no Diet compliance: encouraged.  Exercise:encouraged  B12 recently wnl.    Son Emily Forse. designated if patient were incapacitated.   Covid vaccine encouraged.   Routine vaccine d/w pt.  Shingles d/w pt.   Defer colon and prostate cancer screening given neg FH and other medical issues.   Smoking less then prev, encouraged cessation.    Meds, vitals, and allergies reviewed.   PMH and SH reviewed  ROS: Per HPI unless specifically indicated in ROS section   GEN: nad, alert and oriented HEENT: NCAT NECK: supple w/o LA CV: rrr. PULM: ctab, no inc wob ABD: soft, +bs EXT: no edema SKIN: Well-perfused

## 2020-11-23 NOTE — Chronic Care Management (AMB) (Signed)
  Chronic Care Management   Note  11/23/2020 Name: Devin Ramsey MRN: [id number] DOB: 05-Jan-1946  Devin Ramsey is a 75 y.o. year old male who is a primary care patient of Tonia Ghent, MD. I reached out to Patriciaann Clan by phone today in response to a referral sent by Mr. Devin Ramsey's PCP, Tonia Ghent, MD      Devin Ramsey was given information about Chronic Care Management services today including:  CCM service includes personalized support from designated clinical staff supervised by his physician, including individualized plan of care and coordination with other care providers 24/7 contact phone numbers for assistance for urgent and routine care needs. Service will only be billed when office clinical staff spend 20 minutes or more in a month to coordinate care. Only one practitioner may furnish and bill the service in a calendar month. The patient may stop CCM services at any time (effective at the end of the month) by phone call to the office staff. The patient will be responsible for cost sharing (co-pay) of up to 20% of the service fee (after annual deductible is met).  Patient agreed to services and verbal consent obtained.   Follow up plan: Telephone appointment with care management team member scheduled for:12/05/2020  Noreene Larsson, [address], [place], [place] [address] Direct Dial: [phone] [email or web address]

## 2020-11-26 ENCOUNTER — Other Ambulatory Visit: Payer: Self-pay | Admitting: Family Medicine

## 2020-11-26 DIAGNOSIS — E039 Hypothyroidism, unspecified: Secondary | ICD-10-CM

## 2020-11-26 DIAGNOSIS — L299 Pruritus, unspecified: Secondary | ICD-10-CM | POA: Insufficient documentation

## 2020-11-26 MED ORDER — LEVOTHYROXINE SODIUM 88 MCG PO TABS
88.0000 ug | ORAL_TABLET | Freq: Every day | ORAL | 3 refills | Status: DC
Start: 2020-11-26 — End: 2021-10-07

## 2020-11-26 NOTE — Assessment & Plan Note (Signed)
Compliant.  TSH pending.  See notes on labs.  Taking 75 mcg levothyroxine daily.  Continue levothyroxine as is.

## 2020-11-26 NOTE — Assessment & Plan Note (Signed)
Encourage cessation. °

## 2020-11-26 NOTE — Assessment & Plan Note (Signed)
Continue Crestor.  See notes on labs. 

## 2020-11-26 NOTE — Assessment & Plan Note (Signed)
History of.  B12 most recently normal.

## 2020-11-26 NOTE — Assessment & Plan Note (Signed)
  [name]. designated if patient were incapacitated.   Covid vaccine encouraged.   Routine vaccine d/w pt.  Shingles d/w pt.   Defer colon and prostate cancer screening given neg FH and other medical issues.   Smoking less then prev, encouraged cessation.

## 2020-11-26 NOTE — Assessment & Plan Note (Signed)
Appears euvolemic.  Continue Crestor.

## 2020-11-26 NOTE — Assessment & Plan Note (Signed)
See notes on labs.  Continue as needed prednisone.  No recent/current symptoms.

## 2020-11-26 NOTE — Assessment & Plan Note (Signed)
  Hydroxyzine used prn for itching.  No ADE on med.  It helps.  Rx sent.

## 2020-11-26 NOTE — Assessment & Plan Note (Signed)
Discussed using Spiriva.  Lungs are clear at time of exam.

## 2020-11-26 NOTE — Assessment & Plan Note (Signed)
  [name]. designated if patient were incapacitated.

## 2020-11-26 NOTE — Assessment & Plan Note (Signed)
Continue Lasix as needed along with daily Entresto.  Follow-up labs pending.

## 2020-12-05 ENCOUNTER — Ambulatory Visit (INDEPENDENT_AMBULATORY_CARE_PROVIDER_SITE_OTHER): Payer: Medicare Other | Admitting: *Deleted

## 2020-12-05 DIAGNOSIS — M25569 Pain in unspecified knee: Secondary | ICD-10-CM | POA: Diagnosis not present

## 2020-12-05 DIAGNOSIS — I10 Essential (primary) hypertension: Secondary | ICD-10-CM

## 2020-12-05 DIAGNOSIS — Z636 Dependent relative needing care at home: Secondary | ICD-10-CM

## 2020-12-06 ENCOUNTER — Inpatient Hospital Stay: Payer: Medicare Other

## 2020-12-06 ENCOUNTER — Inpatient Hospital Stay: Payer: Medicare Other | Attending: Oncology | Admitting: Oncology

## 2020-12-06 ENCOUNTER — Encounter: Payer: Self-pay | Admitting: Oncology

## 2020-12-06 VITALS — BP 123/53 | HR 64 | Temp 98.6°F | Resp 18 | Wt 146.4 lb

## 2020-12-06 DIAGNOSIS — Z148 Genetic carrier of other disease: Secondary | ICD-10-CM

## 2020-12-06 DIAGNOSIS — I5022 Chronic systolic (congestive) heart failure: Secondary | ICD-10-CM | POA: Diagnosis not present

## 2020-12-06 DIAGNOSIS — D649 Anemia, unspecified: Secondary | ICD-10-CM | POA: Diagnosis not present

## 2020-12-06 DIAGNOSIS — I4821 Permanent atrial fibrillation: Secondary | ICD-10-CM | POA: Insufficient documentation

## 2020-12-06 DIAGNOSIS — Z7289 Other problems related to lifestyle: Secondary | ICD-10-CM | POA: Insufficient documentation

## 2020-12-06 DIAGNOSIS — I110 Hypertensive heart disease with heart failure: Secondary | ICD-10-CM | POA: Insufficient documentation

## 2020-12-06 DIAGNOSIS — Z720 Tobacco use: Secondary | ICD-10-CM | POA: Diagnosis not present

## 2020-12-06 DIAGNOSIS — Z7901 Long term (current) use of anticoagulants: Secondary | ICD-10-CM | POA: Insufficient documentation

## 2020-12-06 DIAGNOSIS — Z8719 Personal history of other diseases of the digestive system: Secondary | ICD-10-CM | POA: Diagnosis not present

## 2020-12-06 DIAGNOSIS — R5383 Other fatigue: Secondary | ICD-10-CM | POA: Diagnosis not present

## 2020-12-06 DIAGNOSIS — Z885 Allergy status to narcotic agent status: Secondary | ICD-10-CM | POA: Diagnosis not present

## 2020-12-06 DIAGNOSIS — E785 Hyperlipidemia, unspecified: Secondary | ICD-10-CM | POA: Insufficient documentation

## 2020-12-06 DIAGNOSIS — Z79899 Other long term (current) drug therapy: Secondary | ICD-10-CM | POA: Insufficient documentation

## 2020-12-06 DIAGNOSIS — E871 Hypo-osmolality and hyponatremia: Secondary | ICD-10-CM

## 2020-12-06 DIAGNOSIS — E538 Deficiency of other specified B group vitamins: Secondary | ICD-10-CM | POA: Diagnosis not present

## 2020-12-06 DIAGNOSIS — Z87891 Personal history of nicotine dependence: Secondary | ICD-10-CM | POA: Insufficient documentation

## 2020-12-06 DIAGNOSIS — I10 Essential (primary) hypertension: Secondary | ICD-10-CM | POA: Diagnosis not present

## 2020-12-06 DIAGNOSIS — Z7952 Long term (current) use of systemic steroids: Secondary | ICD-10-CM | POA: Insufficient documentation

## 2020-12-06 DIAGNOSIS — J449 Chronic obstructive pulmonary disease, unspecified: Secondary | ICD-10-CM | POA: Diagnosis not present

## 2020-12-06 DIAGNOSIS — E039 Hypothyroidism, unspecified: Secondary | ICD-10-CM | POA: Insufficient documentation

## 2020-12-06 DIAGNOSIS — R634 Abnormal weight loss: Secondary | ICD-10-CM | POA: Diagnosis not present

## 2020-12-06 NOTE — Progress Notes (Signed)
Hematology/Oncology  [facility] Telephone:([phone] [phone]   Patient Care Team: [name], MD as PCP - General (Family Medicine) [name] Tollie Pizza, MD as PCP - Cardiology (Cardiology) Antonieta Iba, MD as Consulting Physician (Cardiology) Rickard Patience, MD as Consulting Physician (Oncology) Buck Mam, LCSW as Social Worker  REFERRING PROVIDER: [name], MD  REASON FOR VISIT:  Follow-up for elevated iron level HISTORY OF PRESENTING ILLNESS:  Devin Ramsey is a  75 y.o.  male with PMH listed below was seen in consultation at the request of  [name], MD  for evaluation of iron overload.  Patient had lab work done on 11/16/2018.  Found to have a ferritin level of 423.  07/16/2018 saturation 53.6.  Transferrin 144. Patient had a history of atrial fibrillation, LBBB, chronic systolic CHF, 05/29/2018 2D echo showed LVEF 35-40% History of chronic alcohol use, 2-3 beers a day on average Former smoker, quit in 2016.  History of 55-year pack smoking.  Today in the clinic, he feels little weak.  Did not eat much breakfast in the morning.  Blood pressure was low, first reading 71/41, heart rate 68, second reading 85/57, with heart rate of 59. Denies any dizziness or lightheadedness. Patient is on Toprol XL 25 mg daily, Lasix 20 mg daily,entresto 24-26mg  BID.  He took all his medication the morning.  INTERVAL HISTORY [name] is a 75 y.o. male who has above history reviewed by me today presents for follow up visit for management of elevated iron level Problems and complaints are listed below: Patient feels well at the baseline.  Slightly more tired.  He has lost weight since last visit, he attributes to not eating as much as he used to.  He seems to have a chronic cough.  He denies that he has more shortness of breath than his baseline. Patient continues to drink alcohol. Patient reports being compliant with oral vitamin B12  supplementation. .  Review of Systems  Constitutional:  Positive for fatigue and unexpected weight change. Negative for appetite change, chills and fever.  HENT:   Negative for hearing loss and voice change.   Eyes:  Negative for eye problems and icterus.  Respiratory:  Negative for chest tightness, cough and shortness of breath.   Cardiovascular:  Negative for chest pain and leg swelling.  Gastrointestinal:  Negative for abdominal distention and abdominal pain.  Endocrine: Negative for hot flashes.  Genitourinary:  Negative for difficulty urinating, dysuria and frequency.   Musculoskeletal:  Negative for arthralgias.  Skin:  Negative for itching and rash.  Neurological:  Negative for dizziness, light-headedness and numbness.  Hematological:  Negative for adenopathy. Does not bruise/bleed easily.  Psychiatric/Behavioral:  Negative for confusion.    MEDICAL HISTORY:  Past Medical History:  Diagnosis Date   Anemia    B12 deficiency 10/28/2019   Bilateral pleural effusion    Chronic systolic CHF (congestive heart failure) (HCC)    a. TTE 2/16: EF of 35-40%, WMA consistent with BBB, normal sized LA; b. TTE 11/16: F of 35-40%, mild concentric LVH, no RWMA, normal LV diastolic function parameters, mild to moderate AI, moderate MR, mildly to moderately dilated left atrium measuring 42 mm   COPD (chronic obstructive pulmonary disease) (HCC)    Essential hypertension    Gout    HLD (hyperlipidemia)    Hypothyroidism    LBBB (left bundle branch block)    Permanent atrial fibrillation (HCC)    a. CHADS2VSC =>  4 (CHF, HTN, age x 1, vascular disease); b. on Eliquis   Pneumonia 2016   Tobacco abuse     SURGICAL HISTORY: Past Surgical History:  Procedure Laterality Date   HEMORRHOID SURGERY     SHOULDER SURGERY     right shoulder    TONSILLECTOMY      SOCIAL HISTORY: Social History   Socioeconomic History   Marital status: Married    Spouse name: Not on file   Number of children:  Not on file   Years of education: Not on file   Highest education level: Not on file  Occupational History   Not on file  Tobacco Use   Smoking status: Former    Packs/day: 1.50    Years: 56.00    Pack years: 84.00    Types: Cigarettes    Quit date: 06/27/2014    Years since quitting: 6.4   Smokeless tobacco: Never  Vaping Use   Vaping Use: Never used  Substance and Sexual Activity   Alcohol use: Yes    Alcohol/week: 15.0 standard drinks    Types: 15 Cans of beer per week    Comment: 2-3 beers a day average   Drug use: No   Sexual activity: Not Currently  Other Topics Concern   Not on file  Social History Narrative   Married 1966   From [place] fan   1 son, local   Social Determinants of Health   Financial Resource Strain: Low Risk    Difficulty of Paying Living Expenses: Not hard at all  Food Insecurity: No Food Insecurity   Worried About Programme researcher, broadcasting/film/video in the Last Year: Never true   Barista in the Last Year: Never true  Transportation Needs: No Transportation Needs   Lack of Transportation (Medical): No   Lack of Transportation (Non-Medical): No  Physical Activity: Inactive   Days of Exercise per Week: 0 days   Minutes of Exercise per Session: 0 min  Stress: No Stress Concern Present   Feeling of Stress : Not at all  Social Connections: Not on file  Intimate Partner Violence: Not At Risk   Fear of Current or Ex-Partner: No   Emotionally Abused: No   Physically Abused: No   Sexually Abused: No    FAMILY HISTORY: Family History  Adopted: Yes  Problem Relation Age of Onset   Colon cancer Neg Hx    Prostate cancer Neg Hx     ALLERGIES:  is allergic to codeine and indocin [indomethacin].  MEDICATIONS:  Current Outpatient Medications  Medication Sig Dispense Refill   ELIQUIS 5 MG TABS tablet Take 1 tablet by mouth twice daily 180 tablet 1   ENTRESTO 24-26 MG Take 1 tablet by mouth twice daily 180 tablet 1   furosemide (LASIX) 20 MG  tablet Take 1 tablet (20 mg total) by mouth daily as needed. 90 tablet 3   hydrOXYzine (ATARAX/VISTARIL) 10 MG tablet TAKE 1/2 TO 1 (ONE-HALF TO ONE) TABLET BY MOUTH EVERY 8 HOURS AS NEEDED FOR ITCHING 30 tablet 5   levothyroxine (SYNTHROID) 88 MCG tablet Take 1 tablet (88 mcg total) by mouth daily. 90 tablet 3   rosuvastatin (CRESTOR) 10 MG tablet Take 1 tablet by mouth once daily 90 tablet 0   tiotropium (SPIRIVA HANDIHALER) 18 MCG inhalation capsule Place 1 capsule (18 mcg total) into inhaler and inhale daily. 30 capsule 12   traMADol (ULTRAM) 50 MG tablet TAKE 2 TABLETS BY MOUTH  EVERY 12 HOURS AS NEEDED FOR  KNEE  PAIN 120 tablet 1   vitamin B-12 (CYANOCOBALAMIN) 1000 MCG tablet Take 1,000 mcg by mouth daily.     predniSONE (DELTASONE) 10 MG tablet TAKE 2 TABLETS BY MOUTH ONCE DAILY AS NEEDED WITH FOOD FOR GOUT (Patient not taking: Reported on 12/06/2020) 20 tablet 0   No current facility-administered medications for this visit.     PHYSICAL EXAMINATION: ECOG PERFORMANCE STATUS: 2 - Symptomatic, <50% confined to bed Vitals:   12/06/20 1021  BP: (!) 123/53  Pulse: 64  Resp: 18  Temp: 98.6 F (37 C)   Filed Weights   12/06/20 1021  Weight: 146 lb 6.4 oz (66.4 kg)    Physical Exam Constitutional:      General: He is not in acute distress.    Appearance: He is not ill-appearing.     Comments: Patient sits in the wheelchair  HENT:     Head: Normocephalic and atraumatic.  Eyes:     General: No scleral icterus.    Pupils: Pupils are equal, round, and reactive to light.  Cardiovascular:     Rate and Rhythm: Normal rate and regular rhythm.     Heart sounds: Normal heart sounds.  Pulmonary:     Effort: Pulmonary effort is normal. No respiratory distress.     Breath sounds: No wheezing.     Comments: Decreased breath sounds bilaterally. Abdominal:     General: Bowel sounds are normal. There is no distension.     Palpations: Abdomen is soft. There is no mass.     Tenderness:  There is no abdominal tenderness.  Musculoskeletal:        General: No deformity. Normal range of motion.     Cervical back: Normal range of motion and neck supple.  Skin:    General: Skin is warm and dry.     Findings: No erythema or rash.  Neurological:     Mental Status: He is alert and oriented to person, place, and time. Mental status is at baseline.     Cranial Nerves: No cranial nerve deficit.     Coordination: Coordination normal.  Psychiatric:        Mood and Affect: Mood normal.      LABORATORY DATA:  I have reviewed the data as listed Lab Results  Component Value Date   WBC 4.1 11/03/2020   HGB 13.6 11/03/2020   HCT 37.4 (L) 11/03/2020   MCV 95.2 11/03/2020   PLT 132 (L) 11/03/2020   Recent Labs    04/28/20 0930 11/03/20 1056 11/23/20 1013  NA  --   --  129*  K  --   --  4.5  CL  --   --  96  CO2  --   --  26  GLUCOSE  --   --  94  BUN  --   --  11  CREATININE  --   --  1.15  CALCIUM  --   --  8.5  PROT 6.1* 6.1*  --   ALBUMIN 3.5 3.6  --   AST 22 20  --   ALT 13 11  --   ALKPHOS 99 113  --   BILITOT 1.1 1.3*  --   BILIDIR 0.2 0.4*  --   IBILI 0.9 0.9  --     Iron/TIBC/Ferritin/ %Sat    Component Value Date/Time   IRON 86 04/28/2020 0930   TIBC 270 04/28/2020 0930   FERRITIN 175 04/28/2020 0930  IRONPCTSAT 32 04/28/2020 0930      RADIOGRAPHIC STUDIES: I have personally reviewed the radiological images as listed and agreed with the findings in the report. No results found.   ASSESSMENT & PLAN:  1. Hemochromatosis carrier   2. B12 deficiency   3. Tobacco abuse   4. Loss of weight   5. Hyponatremia     Hemochromatosis carrier- a single mutation of H63D Iron panel was reviewed and discussed with patient.  Stable ferritin and iron saturation.  I will hold off phlebotomy.  Chronic anemia has improved and normalized.  Hemoglobin 13.6 today. History of vitamin B12 deficiency, chronic alcohol use.  Vitamin B12 level at 435.  I recommend  patient to continue oral vitamin B12 supplementation 100 MCG daily.  Alcohol cessation was recommended.  Tobacco abuse, patient reports that he stopped smoking 6 years ago and after that he smokes cigarettes here and there.  Smoke cessation was discussed with patient.  I have previously referred him to establish care with lung cancer screening program. Today's blood work showed hyponatremia and he seems to have unintentional weight loss. I will obtain CT chest without contrast for further evaluation.  Orders Placed This Encounter  Procedures   CT Chest Wo Contrast    Standing Status:   Future    Standing Expiration Date:   12/06/2021    Order Specific Question:   Preferred imaging location?    Answer:   [facility]    All questions were answered. The patient knows to call the clinic with any problems questions or concerns.  cc [name], MD    Follow-up visit to be determined.  Rickard Patience, MD, PhD Hematology Oncology [facility] 12/06/2020

## 2020-12-06 NOTE — Progress Notes (Signed)
Patient here for follow up. No new concerns voiced.  °

## 2020-12-07 NOTE — Chronic Care Management (AMB) (Signed)
Chronic Care Management    Clinical Social Work Note  12/07/2020 Name: JARREN PARA MRN: [id number] DOB: Oct 22, 1945  FABRIZZIO MARCELLA is a 75 y.o. year old male who is a primary care patient of [name], MD. The CCM team was consulted to assist the patient with chronic disease management and/or care coordination needs related to: Caregiver Stress.   Engaged with patient by telephone for initial visit in response to provider referral for social work chronic care management and care coordination services.   Consent to Services:  The patient was given information about Chronic Care Management services, agreed to services, and gave verbal consent prior to initiation of services.  Please see initial visit note for detailed documentation.   Patient agreed to services and consent obtained.   Assessment: Review of patient past medical history, allergies, medications, and health status, including review of relevant consultants reports was performed today as part of a comprehensive evaluation and provision of chronic care management and care coordination services.     SDOH (Social Determinants of Health) assessments and interventions performed:    Advanced Directives Status: Not addressed in this encounter.  CCM Care Plan  Allergies  Allergen Reactions   Codeine     Sedation.  Tolerates other opiates.     Indocin [Indomethacin]     Elevated BP, would avoid given his h/o CHF    Outpatient Encounter Medications as of 12/05/2020  Medication Sig   ELIQUIS 5 MG TABS tablet Take 1 tablet by mouth twice daily   ENTRESTO 24-26 MG Take 1 tablet by mouth twice daily   furosemide (LASIX) 20 MG tablet Take 1 tablet (20 mg total) by mouth daily as needed.   hydrOXYzine (ATARAX/VISTARIL) 10 MG tablet TAKE 1/2 TO 1 (ONE-HALF TO ONE) TABLET BY MOUTH EVERY 8 HOURS AS NEEDED FOR ITCHING   levothyroxine (SYNTHROID) 88 MCG tablet Take 1 tablet (88 mcg total) by mouth daily.   predniSONE (DELTASONE) 10  MG tablet TAKE 2 TABLETS BY MOUTH ONCE DAILY AS NEEDED WITH FOOD FOR GOUT (Patient not taking: Reported on 12/06/2020)   rosuvastatin (CRESTOR) 10 MG tablet Take 1 tablet by mouth once daily   tiotropium (SPIRIVA HANDIHALER) 18 MCG inhalation capsule Place 1 capsule (18 mcg total) into inhaler and inhale daily.   traMADol (ULTRAM) 50 MG tablet TAKE 2 TABLETS BY MOUTH EVERY 12 HOURS AS NEEDED FOR  KNEE  PAIN   No facility-administered encounter medications on file as of 12/05/2020.    Patient Active Problem List   Diagnosis Date Noted   Itching 11/26/2020   Hemochromatosis carrier 04/28/2020   Chronic alcohol use 04/28/2020   Current smoker 04/28/2020   B12 deficiency 10/28/2019   Gout 09/06/2019   Health care maintenance 11/16/2017   Anemia 11/16/2017   Dry skin 06/06/2017   Chronic obstructive pulmonary disease (HCC) 10/03/2016   Neck pain 10/03/2016   Skin lesion 05/06/2016   Olecranon bursitis 03/07/2016   Knee pain 01/08/2016   Primary osteoarthritis of left knee 01/04/2016   Cardiomyopathy, ischemic 01/04/2016   HLD (hyperlipidemia) 01/04/2016   Hypothyroidism 11/19/2015   Advance care planning 11/19/2015   Atrial fibrillation (HCC) 06/29/2015   Biceps tendon rupture 03/24/2015   COPD with acute exacerbation (HCC) 07/11/2014   Congestive dilated cardiomyopathy (HCC) 07/11/2014   Chronic systolic CHF (congestive heart failure) (HCC) 07/11/2014   Essential hypertension 07/11/2014    Conditions to be addressed/monitored: Anxiety and caregiver stress ;  resource support  Care Plan : LCSW  Plan of Care  Updates made by Buck Mam, LCSW since 12/07/2020 12:00 AM     Problem: Caregiver Stress      Goal: Provide resources, support and care plan to assist with Caregiver Coping/Stress   Start Date: 12/05/2020  Expected End Date: 01/16/2021  This Visit's Progress: On track  Priority: High  Note:   Current barriers:    Limited social support, Lacks knowledge of  community resource: caregiver stress, and resources Clinical Goals: Patient will work with Care Guide and CSW to address needs related to caregiver stress/needs Clinical Interventions:  Collaboration with [name], MD regarding development and update of comprehensive plan of care as evidenced by provider attestation and co-signature Inter-disciplinary care team collaboration (see longitudinal plan of care) Assessment of needs, barriers , agencies contacted, as well as how impacting  Review various resources, discussed options and provided patient information about Referral to care guide for community support and other options   Services provided by [employer]  Dementia resources and support  Patient interviewed and appropriate assessments performed Referred patient to community resources care guide team for assistance with Adult Day Care resources for wife, PCS application, senior resource center /activities Clinical interventions provided:Solution-Focused Strategies, Active listening / Reflection utilized , and Caregiver stress acknowledged  Patient Goals/Self-Care Activities: Over the next 30 days I have place a referral to the care guide for community resources, they will follow up with you         Follow Up Plan: SW will follow up with patient by phone over the next 30 days and [employer] will reach out to patient for assistance with resources/support for caregiving for wife.      Reece Levy MSW, LCSW Licensed Clinical Social Worker [facility] [place] [phone]

## 2020-12-07 NOTE — Patient Instructions (Signed)
Visit Information  PATIENT GOALS:  Goals Addressed               This Visit's Progress     Find Help in My Community for caregiver stress related to wife with dementia (pt-stated)        Timeframe:  Short-Term Goal Priority:  High Start Date:       07/19/2                       Expected End Date:                       Follow Up Date 01/02/21    - begin a notebook of services in my neighborhood or community - call 211 when I need some help - follow-up on any referrals for help I am given - think ahead to make sure my need does not become an emergency - make a note about what I need to have by the phone or take with me, like an identification card or social security number have a back-up plan - have a back-up plan - make a list of family or friends that I can call    Why is this important?   Knowing how and where to find help for yourself or family in your neighborhood and community is an important skill.  You will want to take some steps to learn how.    Notes:         The patient verbalized understanding of instructions, educational materials, and care plan provided today and declined offer to receive copy of patient instructions, educational materials, and care plan.   Telephone follow up appointment with care management team member scheduled for:  Reece Levy MSW, LCSW Licensed Clinical Social Worker [facility] [place] [phone]

## 2020-12-08 ENCOUNTER — Telehealth: Payer: Self-pay | Admitting: *Deleted

## 2020-12-08 NOTE — Telephone Encounter (Signed)
   Telephone encounter was:  Unsuccessful.  12/08/2020 Name: Devin Ramsey MRN: [id number] DOB: 21-Mar-1946  Unsuccessful outbound call made today to assist with:  Caregiver Stress  Outreach Attempt:  1st Attempt No voicemail  Alois Cliche -[facility] Guide , Embedded Care Coordination [facility], Care Management  [phone] [address] [place] , [place] [address] Email : [name]

## 2020-12-11 ENCOUNTER — Telehealth: Payer: Self-pay | Admitting: *Deleted

## 2020-12-11 NOTE — Telephone Encounter (Signed)
   Telephone encounter was:  Unsuccessful.  12/11/2020 Name: GIOVANNIE SCERBO MRN: [id number] DOB: 1945-07-17  Unsuccessful outbound call made today to assist with:  Caregiver Stress  Outreach Attempt:  2nd Attempt  No way to leave vm Alois Cliche -[facility] Guide , Embedded Care Coordination [facility], Care Management  [phone] [address] [place] , [place] [address] Email : [name]

## 2020-12-11 NOTE — Telephone Encounter (Signed)
   Telephone encounter was:  Unsuccessful.  12/11/2020 Name: Devin Ramsey MRN: [id number] DOB: 1946-04-13  Unsuccessful outbound call made today to assist with:  Caregiver Stress  Outreach Attempt:  2nd Attempt No voicemail  Alois Cliche -[facility] Guide , Embedded Care Coordination [facility], Care Management  [phone] [address] [place] , [place] [address] Email : [name]

## 2020-12-18 ENCOUNTER — Ambulatory Visit: Admission: RE | Admit: 2020-12-18 | Payer: Medicare Other | Source: Ambulatory Visit

## 2020-12-19 ENCOUNTER — Telehealth: Payer: Self-pay | Admitting: Oncology

## 2020-12-19 NOTE — Telephone Encounter (Signed)
Called PT multiple times and he didn't answer. Mailed appt reminder to PT for appt on 08/05 at 1:30PM for CT SCAN.

## 2020-12-22 ENCOUNTER — Other Ambulatory Visit: Payer: Self-pay | Admitting: Family Medicine

## 2020-12-22 ENCOUNTER — Ambulatory Visit: Payer: Medicare Other | Attending: Oncology

## 2021-01-02 ENCOUNTER — Telehealth: Payer: Medicare Other

## 2021-01-03 ENCOUNTER — Telehealth: Payer: Self-pay | Admitting: *Deleted

## 2021-01-03 NOTE — Telephone Encounter (Signed)
  Care Management   Follow Up Note   01/03/2021 Name: DAMEER SPEISER MRN: [id number] DOB: 1945-06-28   Referred by: [name], MD Reason for referral : No chief complaint on file.   An unsuccessful telephone outreach was attempted today. The patient was referred to the case management team for assistance with care management and care coordination.   Follow Up Plan: Telephone follow up appointment with care management team member scheduled for:the next 10 days  Reece Levy MSW, LCSW Licensed Clinical Social Worker [facility] [phone]

## 2021-01-05 DIAGNOSIS — M25569 Pain in unspecified knee: Secondary | ICD-10-CM | POA: Diagnosis not present

## 2021-01-12 NOTE — Progress Notes (Signed)
Cardiology Office Note  Date:  01/15/2021   ID:  Devin Ramsey, Devin Ramsey Sep 25, 1945, MRN [id number]  PCP:  [name], MD   Chief Complaint  Patient presents with   6 month follow up     Patient c/o pain in legs with walking. Medications reviewed by the patient verbally.     HPI:  Mr. Huckaba is a 75 year old gentleman with past medical history of Moderate diffuse aortic atherosclerosis on CT 2016 hospital 06/2014 with bronchitis symptoms,  Permanent atrial fibrillation,  chest x-ray showing pneumonia who was kept in the hospital for 5 days,  COPD exacerbation.  cardioversion 08/23/2014 for A. fib flutter,  Previous echocardiogram showed ejection fraction 35-40%, February 2016 in the setting of atrial fibrillation Stopped smoking 06/28/2014, wife still smokes He has previously declined ischemia workup He follows up today to discuss atrial fibrillation management  Weight down 6 pounds  Rare "swimmy headed" for one minutes, once a month Sits down, goes away On entresto On eliquis  No angina/no chest pain On walking, legs give out  Wife with alzheimers, "asks the same thing over and over"  Echo in 07/2020: EF 30-35% In 2021: was 35 to 40%  Labs: CR 1.15 Sodium 129  EKG personally reviewed by myself on todays visit showing atrial flutter, rate 64 bpm,LBBB  Followed by Dr. Graciela Husbands Beta-blocker held with improvement of his bradycardia  Other past medical history EKG in January 2017 showing atrial fibrillation Prior EKG in October and November 2016 shows normal sinus rhythm with left bundle branch block    previous  Ejection fraction remains low at 35-40%, no significant improvement by restoring normal sinus rhythm   Echocardiogram  from 06/29/2014 showing ejection fraction 35-40%, septal motion consistent with bundle branch block, normal right ventricular systolic pressure, normal left atrial size    PMH:   has a past medical history of Anemia, B12 deficiency  (10/28/2019), Bilateral pleural effusion, Chronic systolic CHF (congestive heart failure) (HCC), COPD (chronic obstructive pulmonary disease) (HCC), Essential hypertension, Gout, HLD (hyperlipidemia), Hypothyroidism, LBBB (left bundle branch block), Permanent atrial fibrillation (HCC), Pneumonia (2016), and Tobacco abuse.  PSH:    Past Surgical History:  Procedure Laterality Date   HEMORRHOID SURGERY     SHOULDER SURGERY     right shoulder    TONSILLECTOMY      Current Outpatient Medications  Medication Sig Dispense Refill   ELIQUIS 5 MG TABS tablet Take 1 tablet by mouth twice daily 180 tablet 1   ENTRESTO 24-26 MG Take 1 tablet by mouth twice daily 180 tablet 1   furosemide (LASIX) 20 MG tablet Take 1 tablet (20 mg total) by mouth daily as needed. 90 tablet 3   hydrOXYzine (ATARAX/VISTARIL) 10 MG tablet TAKE 1/2 TO 1 (ONE-HALF TO ONE) TABLET BY MOUTH EVERY 8 HOURS AS NEEDED FOR ITCHING 30 tablet 5   levothyroxine (SYNTHROID) 88 MCG tablet Take 1 tablet (88 mcg total) by mouth daily. 90 tablet 3   rosuvastatin (CRESTOR) 10 MG tablet Take 1 tablet by mouth once daily 90 tablet 0   tiotropium (SPIRIVA HANDIHALER) 18 MCG inhalation capsule Place 1 capsule (18 mcg total) into inhaler and inhale daily. 30 capsule 12   traMADol (ULTRAM) 50 MG tablet TAKE 2 TABLETS BY MOUTH EVERY 12 HOURS AS NEEDED FOR  KNEE  PAIN 120 tablet 1   vitamin B-12 (CYANOCOBALAMIN) 1000 MCG tablet Take 1,000 mcg by mouth daily.     predniSONE (DELTASONE) 10 MG tablet TAKE 2 TABLETS BY  MOUTH ONCE DAILY AS NEEDED WITH FOOD FOR GOUT (Patient not taking: No sig reported) 20 tablet 0   No current facility-administered medications for this visit.     Allergies:   Codeine and Indocin [indomethacin]   Social History:  The patient  reports that he has been smoking cigarettes. He has a 14.00 pack-year smoking history. He has never used smokeless tobacco. He reports current alcohol use of about 15.0 standard drinks per week. He  reports that he does not use drugs.   Family History:   family history is not on file. He was adopted.    Review of Systems: Review of Systems  Constitutional: Negative.   Respiratory:  Positive for shortness of breath.   Cardiovascular: Negative.   Gastrointestinal: Negative.   Musculoskeletal:  Positive for joint pain.  Neurological: Negative.   Psychiatric/Behavioral: Negative.    All other systems reviewed and are negative.   PHYSICAL EXAM: VS:  BP 100/60 (BP Location: Left Arm, Patient Position: Sitting, Cuff Size: Normal)   Pulse 64   Ht 5\' 10"  (1.778 m)   Wt 140 lb 6 oz (63.7 kg)   BMI 20.14 kg/m  , BMI Body mass index is 20.14 kg/m. Constitutional:  oriented to person, place, and time. No distress. Very thin HENT:  Head: Grossly normal Eyes:  no discharge. No scleral icterus.  Neck: No JVD, no carotid bruits  Cardiovascular: Regular rate and rhythm, no murmurs appreciated Pulmonary/Chest: Clear to auscultation bilaterally, no wheezes or rails Abdominal: Soft.  no distension.  no tenderness.  Musculoskeletal: Normal range of motion Neurological:  normal muscle tone. Coordination normal. No atrophy Skin: Skin warm and dry Psychiatric: normal affect, pleasant   Recent Labs: 11/03/2020: ALT 11; Hemoglobin 13.6; Platelets 132 11/23/2020: BUN 11; Creatinine, Ser 1.15; Potassium 4.5; Sodium 129; TSH 9.75    Lipid Panel Lab Results  Component Value Date   CHOL 107 11/23/2020   HDL 51.70 11/23/2020   LDLCALC 45 11/23/2020   TRIG 51.0 11/23/2020      Wt Readings from Last 3 Encounters:  01/15/21 140 lb 6 oz (63.7 kg)  12/06/20 146 lb 6.4 oz (66.4 kg)  11/23/20 148 lb (67.1 kg)       ASSESSMENT AND PLAN:  Congestive dilated cardiomyopathy (HCC) - Low blood pressure, continue Entresto at current dose No orthostasis sx  Chronic systolic CHF (congestive heart failure) (HCC) -  euvolemic, not on Lasix Weight trending down  Hypotension No changes made,  will monitor his home blood pressure numbers Recommended he call 01/24/21 for orthostasis symptoms  Permanent atrial fibrillation (HCC) - flutter Rate controlled on anticoagulation no changes made, stable On Eliquis  Hyperlipidemia Cholesterol is at goal on the current lipid regimen. No changes to the medications were made.  Severe protein calorie malnutrition Discussed diet with him,  Blood pressure running on the low end Discussed need to not miss any meals   Total encounter time more than 25 minutes  Greater than 50% was spent in counseling and coordination of care with the patient    Orders Placed This Encounter  Procedures   EKG 12-Lead     Signed, [place], M.D., Ph.D. 01/15/2021  [facility] [place], [place]

## 2021-01-15 ENCOUNTER — Other Ambulatory Visit: Payer: Self-pay

## 2021-01-15 ENCOUNTER — Encounter: Payer: Self-pay | Admitting: Cardiovascular Disease

## 2021-01-15 ENCOUNTER — Ambulatory Visit (INDEPENDENT_AMBULATORY_CARE_PROVIDER_SITE_OTHER): Payer: Medicare Other | Admitting: Cardiovascular Disease

## 2021-01-15 VITALS — BP 100/60 | HR 64 | Ht 70.0 in | Wt 140.4 lb

## 2021-01-15 DIAGNOSIS — I38 Endocarditis, valve unspecified: Secondary | ICD-10-CM

## 2021-01-15 DIAGNOSIS — I428 Other cardiomyopathies: Secondary | ICD-10-CM | POA: Diagnosis not present

## 2021-01-15 DIAGNOSIS — I10 Essential (primary) hypertension: Secondary | ICD-10-CM

## 2021-01-15 DIAGNOSIS — I420 Dilated cardiomyopathy: Secondary | ICD-10-CM | POA: Diagnosis not present

## 2021-01-15 DIAGNOSIS — I5022 Chronic systolic (congestive) heart failure: Secondary | ICD-10-CM

## 2021-01-15 DIAGNOSIS — I4821 Permanent atrial fibrillation: Secondary | ICD-10-CM

## 2021-01-15 MED ORDER — ENTRESTO 24-26 MG PO TABS: 1.0000 | ORAL_TABLET | Freq: Two times a day (BID) | ORAL | 3 refills | Status: AC

## 2021-01-15 MED ORDER — ROSUVASTATIN CALCIUM 10 MG PO TABS: 10.0000 mg | ORAL_TABLET | Freq: Every day | ORAL | 3 refills | Status: AC

## 2021-01-15 MED ORDER — FUROSEMIDE 20 MG PO TABS: 20.0000 mg | ORAL_TABLET | Freq: Every day | ORAL | 3 refills | Status: AC | PRN

## 2021-01-15 NOTE — Patient Instructions (Signed)
Medication Instructions:  Please continue your current medications   *If you need a refill on your cardiac medications before your next appointment, please call your pharmacy*   Lab Work: None  Testing/Procedures: None   Follow-Up: At [facility], you and your health needs are our priority.  As part of our continuing mission to provide you with exceptional heart care, we have created designated Provider Care Teams.  These Care Teams include your primary Cardiologist (physician) and Advanced Practice Providers (APPs -  Physician Assistants and Nurse Practitioners) who all work together to provide you with the care you need, when you need it.  We recommend signing up for the patient portal called "MyChart".  Sign up information is provided on this After Visit Summary.  MyChart is used to connect with patients for Virtual Visits (Telemedicine).  Patients are able to view lab/test results, encounter notes, upcoming appointments, etc.  Non-urgent messages can be sent to your provider as well.   To learn more about what you can do with MyChart, go to [email or web address].    Your next appointment:   12 month(s)  The format for your next appointment:   In Person  Provider:   You may see Timothy Gollan, MD or one of the following Advanced Practice Providers on your designated Care Team:   Christopher Berge, NP Ryan Dunn, PA-C Jacquelyn Visser, PA-C Cadence Furth, PA-C   

## 2021-01-17 ENCOUNTER — Telehealth: Payer: Medicare Other

## 2021-01-18 ENCOUNTER — Telehealth: Payer: Self-pay | Admitting: *Deleted

## 2021-01-18 NOTE — Telephone Encounter (Signed)
  Care Management   Follow Up Note   01/18/2021 Name: Devin Ramsey MRN: [id number] DOB: July 29, 1945   Referred by: [name], MD Reason for referral : No chief complaint on file.   A second unsuccessful telephone outreach was attempted today. The patient was referred to the case management team for assistance with care management and care coordination.   Follow Up Plan: The care management team will reach out to the patient again over the next 10 days. If no return call is received.  Reece Levy MSW, LCSW Licensed Clinical Social Worker [facility] [place] [phone]

## 2021-01-19 ENCOUNTER — Telehealth: Payer: Self-pay | Admitting: *Deleted

## 2021-01-19 NOTE — Telephone Encounter (Signed)
  Care Management   Follow Up Note   01/19/2021 Name: Devin Ramsey MRN: [id number] DOB: [birth year]   Referred by: [name], MD Reason for referral : No chief complaint on file.   A second unsuccessful telephone outreach was attempted today. The patient was referred to the case management team for assistance with care management and care coordination.   Follow Up Plan: The patient has been provided with contact information for the care management team and has been advised to call with any health related questions or concerns.  CSW will attempt again in the next 10-14 days if no response received.  Reece Levy MSW, LCSW Licensed Clinical Social Worker [facility] [place] [phone]

## 2021-01-31 ENCOUNTER — Telehealth: Payer: Medicare Other

## 2021-02-01 ENCOUNTER — Telehealth: Payer: Self-pay | Admitting: *Deleted

## 2021-02-01 NOTE — Telephone Encounter (Signed)
  Care Management   Follow Up Note   02/01/2021 Name: Devin Ramsey MRN: [id number] DOB: 1946-03-02   Referred by: [name], MD Reason for referral : No chief complaint on file.   A second unsuccessful telephone outreach was attempted today. The patient was referred to the case management team for assistance with care management and care coordination.   Follow Up Plan: The care management team will reach out to the patient again over the next 10 days.  Reece Levy MSW, LCSW Licensed Clinical Social Worker [facility] [place] [phone]

## 2021-03-29 ENCOUNTER — Telehealth: Payer: Self-pay | Admitting: Family Medicine

## 2021-03-29 NOTE — Telephone Encounter (Signed)
Pt has called in regards to getting a refill on triamcinolone cream refilled   He had a hard time getting it at the pharmacy since it has been a long time  Pharmacy: [facility] [address], [place] - [address]

## 2021-03-29 NOTE — Telephone Encounter (Signed)
I has been awhile since the TAC cream was refilled; okay to send in?

## 2021-03-30 ENCOUNTER — Ambulatory Visit: Payer: Medicare Other

## 2021-03-30 MED ORDER — TRIAMCINOLONE ACETONIDE 0.1 % EX CREA: TOPICAL_CREAM | Freq: Every day | CUTANEOUS | 1 refills | Status: DC | PRN

## 2021-03-30 NOTE — Telephone Encounter (Signed)
Okay, sent. Thanks.

## 2021-03-30 NOTE — Addendum Note (Signed)
Addended by: [name] on: 03/30/2021 07:30 AM   Modules accepted: Orders

## 2021-03-30 NOTE — Telephone Encounter (Signed)
Patient notified rx was sent 

## 2021-04-02 ENCOUNTER — Ambulatory Visit (INDEPENDENT_AMBULATORY_CARE_PROVIDER_SITE_OTHER): Payer: Medicare Other | Admitting: *Deleted

## 2021-04-02 DIAGNOSIS — J449 Chronic obstructive pulmonary disease, unspecified: Secondary | ICD-10-CM

## 2021-04-02 DIAGNOSIS — Z636 Dependent relative needing care at home: Secondary | ICD-10-CM

## 2021-04-02 NOTE — Patient Instructions (Signed)
Visit Information  (Copy and paste patient goals from clinical care plan here)  The patient verbalized understanding of instructions, educational materials, and care plan provided today and declined offer to receive copy of patient instructions, educational materials, and care plan.   Telephone follow up appointment with care management team member scheduled for:05/16/21 Reece Levy MSW, LCSW Licensed Clinical Social Worker [facility] [place] [phone]

## 2021-04-02 NOTE — Chronic Care Management (AMB) (Signed)
Chronic Care Management    Clinical Social Work Note  04/02/2021 Name: Devin Ramsey MRN: [id number] DOB: 24-Jun-1945  Devin Ramsey is a 75 y.o. year old male who is a primary care patient of [name], MD. The CCM team was consulted to assist the patient with chronic disease management and/or care coordination needs related to: [employer]  and Caregiver Stress.   Engaged with patient by telephone for follow up visit in response to provider referral for social work chronic care management and care coordination services.   Consent to Services:  The patient was given information about Chronic Care Management services, agreed to services, and gave verbal consent prior to initiation of services.  Please see initial visit note for detailed documentation.   Patient agreed to services and consent obtained.   Assessment: Review of patient past medical history, allergies, medications, and health status, including review of relevant consultants reports was performed today as part of a comprehensive evaluation and provision of chronic care management and care coordination services.     SDOH (Social Determinants of Health) assessments and interventions performed:    Advanced Directives Status: Not addressed in this encounter.  CCM Care Plan  Allergies  Allergen Reactions   Codeine     Sedation.  Tolerates other opiates.     Indocin [Indomethacin]     Elevated BP, would avoid given his h/o CHF    Outpatient Encounter Medications as of 04/02/2021  Medication Sig   ELIQUIS 5 MG TABS tablet Take 1 tablet by mouth twice daily   furosemide (LASIX) 20 MG tablet Take 1 tablet (20 mg total) by mouth daily as needed.   hydrOXYzine (ATARAX/VISTARIL) 10 MG tablet TAKE 1/2 TO 1 (ONE-HALF TO ONE) TABLET BY MOUTH EVERY 8 HOURS AS NEEDED FOR ITCHING   levothyroxine (SYNTHROID) 88 MCG tablet Take 1 tablet (88 mcg total) by mouth daily.   predniSONE (DELTASONE) 10 MG tablet TAKE 2 TABLETS  BY MOUTH ONCE DAILY AS NEEDED WITH FOOD FOR GOUT (Patient not taking: No sig reported)   rosuvastatin (CRESTOR) 10 MG tablet Take 1 tablet (10 mg total) by mouth daily.   sacubitril-valsartan (ENTRESTO) 24-26 MG Take 1 tablet by mouth 2 (two) times daily.   tiotropium (SPIRIVA HANDIHALER) 18 MCG inhalation capsule Place 1 capsule (18 mcg total) into inhaler and inhale daily.   traMADol (ULTRAM) 50 MG tablet TAKE 2 TABLETS BY MOUTH EVERY 12 HOURS AS NEEDED FOR  KNEE  PAIN   triamcinolone cream (KENALOG) 0.1 % Apply topically daily as needed.   vitamin B-12 (CYANOCOBALAMIN) 1000 MCG tablet Take 1,000 mcg by mouth daily.   No facility-administered encounter medications on file as of 04/02/2021.    Patient Active Problem List   Diagnosis Date Noted   Itching 11/26/2020   Hemochromatosis carrier 04/28/2020   Chronic alcohol use 04/28/2020   Current smoker 04/28/2020   B12 deficiency 10/28/2019   Gout 09/06/2019   Health care maintenance 11/16/2017   Anemia 11/16/2017   Dry skin 06/06/2017   Chronic obstructive pulmonary disease (HCC) 10/03/2016   Neck pain 10/03/2016   Skin lesion 05/06/2016   Olecranon bursitis 03/07/2016   Knee pain 01/08/2016   Primary osteoarthritis of left knee 01/04/2016   Cardiomyopathy, ischemic 01/04/2016   HLD (hyperlipidemia) 01/04/2016   Hypothyroidism 11/19/2015   Advance care planning 11/19/2015   Atrial fibrillation (HCC) 06/29/2015   Biceps tendon rupture 03/24/2015   COPD with acute exacerbation (HCC) 07/11/2014   Congestive dilated cardiomyopathy (HCC)  07/11/2014   Chronic systolic CHF (congestive heart failure) (HCC) 07/11/2014   Essential hypertension 07/11/2014    Conditions to be addressed/monitored:  caregiver stress ; Lacks knowledge of community resource:    Care Plan : LCSW Plan of Care  Updates made by Buck Mam, LCSW since 04/02/2021 12:00 AM     Problem: Caregiver Stress      Goal: Provide resources, support and care  plan to assist with Caregiver Coping/Stress   Start Date: 12/05/2020  Expected End Date: 06/18/2021  This Visit's Progress: On track  Recent Progress: On track  Priority: High  Note:   Current barriers:    Limited social support, Lacks knowledge of community resource: caregiver stress, and resources Clinical Goals: Patient will work with Care Guide and CSW to address needs related to caregiver stress/needs Clinical Interventions:  Spoke with pt who reports he has not pursued any caregiver support/services in the home yet;  stating he wants to wait until after Christmas- CSW offered to touchbase after Christmas and see if he has questions or further needs -  Collaboration with [name], MD regarding development and update of comprehensive plan of care as evidenced by provider attestation and co-signature Inter-disciplinary care team collaboration (see longitudinal plan of care) Assessment of needs, barriers , agencies contacted, as well as how impacting  Review various resources, discussed options and provided patient information about Referral to care guide for community support and other options   Services provided by [employer]  Dementia resources and support  Patient interviewed and appropriate assessments performed Referred patient to community resources care guide team for assistance with Adult Day Care resources for wife, PCS application, senior resource center /activities Clinical interventions provided:Solution-Focused Strategies, Active listening / Reflection utilized , and Caregiver stress acknowledged  Patient Goals/Self-Care Activities: Over the next 30 days I have place a referral to the care guide for community resources, they will follow up with you         Follow Up Plan: Appointment scheduled for SW follow up with client by phone on: 05/16/21      Reece Levy MSW, LCSW Licensed Clinical Social Worker [facility] [phone]

## 2021-04-18 DIAGNOSIS — J449 Chronic obstructive pulmonary disease, unspecified: Secondary | ICD-10-CM | POA: Diagnosis not present

## 2021-05-15 ENCOUNTER — Other Ambulatory Visit: Payer: Self-pay | Admitting: Family Medicine

## 2021-05-15 NOTE — Telephone Encounter (Signed)
Refill request for prednisone 10 mg tablets  LOV - 11/23/20 Next OV - not scheduled Last refill - 11/08/20 #20/0

## 2021-05-16 ENCOUNTER — Ambulatory Visit (INDEPENDENT_AMBULATORY_CARE_PROVIDER_SITE_OTHER): Payer: Medicare Other | Admitting: *Deleted

## 2021-05-16 DIAGNOSIS — J449 Chronic obstructive pulmonary disease, unspecified: Secondary | ICD-10-CM

## 2021-05-16 DIAGNOSIS — I10 Essential (primary) hypertension: Secondary | ICD-10-CM

## 2021-05-16 DIAGNOSIS — Z636 Dependent relative needing care at home: Secondary | ICD-10-CM

## 2021-05-16 NOTE — Telephone Encounter (Signed)
Sent. Thanks.   

## 2021-05-16 NOTE — Chronic Care Management (AMB) (Signed)
Chronic Care Management    Clinical Social Work Note  05/16/2021 Name: Devin Ramsey MRN: [id number] DOB: [birth year]  Devin Ramsey is a 75 y.o. year old male who is a primary care patient of [name], MD. The CCM team was consulted to assist the patient with chronic disease management and/or care coordination needs related to: [employer]  and Caregiver Stress.   Engaged with patient by telephone for follow up visit in response to provider referral for social work chronic care management and care coordination services.   Consent to Services:  The patient was given information about Chronic Care Management services, agreed to services, and gave verbal consent prior to initiation of services.  Please see initial visit note for detailed documentation.   Patient agreed to services and consent obtained.   Assessment: Review of patient past medical history, allergies, medications, and health status, including review of relevant consultants reports was performed today as part of a comprehensive evaluation and provision of chronic care management and care coordination services.     SDOH (Social Determinants of Health) assessments and interventions performed:    Advanced Directives Status: Not addressed in this encounter.  CCM Care Plan  Allergies  Allergen Reactions   Codeine     Sedation.  Tolerates other opiates.     Indocin [Indomethacin]     Elevated BP, would avoid given his h/o CHF    Outpatient Encounter Medications as of 05/16/2021  Medication Sig   ELIQUIS 5 MG TABS tablet Take 1 tablet by mouth twice daily   furosemide (LASIX) 20 MG tablet Take 1 tablet (20 mg total) by mouth daily as needed.   hydrOXYzine (ATARAX/VISTARIL) 10 MG tablet TAKE 1/2 TO 1 (ONE-HALF TO ONE) TABLET BY MOUTH EVERY 8 HOURS AS NEEDED FOR ITCHING   levothyroxine (SYNTHROID) 88 MCG tablet Take 1 tablet (88 mcg total) by mouth daily.   predniSONE (DELTASONE) 10 MG tablet TAKE 2 TABLETS  BY MOUTH ONCE DAILY AS NEEDED WITH FOOD FOR GOUT   rosuvastatin (CRESTOR) 10 MG tablet Take 1 tablet (10 mg total) by mouth daily.   sacubitril-valsartan (ENTRESTO) 24-26 MG Take 1 tablet by mouth 2 (two) times daily.   tiotropium (SPIRIVA HANDIHALER) 18 MCG inhalation capsule Place 1 capsule (18 mcg total) into inhaler and inhale daily.   traMADol (ULTRAM) 50 MG tablet TAKE 2 TABLETS BY MOUTH EVERY 12 HOURS AS NEEDED FOR  KNEE  PAIN   triamcinolone cream (KENALOG) 0.1 % Apply topically daily as needed.   vitamin B-12 (CYANOCOBALAMIN) 1000 MCG tablet Take 1,000 mcg by mouth daily.   No facility-administered encounter medications on file as of 05/16/2021.    Patient Active Problem List   Diagnosis Date Noted   Itching 11/26/2020   Hemochromatosis carrier 04/28/2020   Chronic alcohol use 04/28/2020   Current smoker 04/28/2020   B12 deficiency 10/28/2019   Gout 09/06/2019   Health care maintenance 11/16/2017   Anemia 11/16/2017   Dry skin 06/06/2017   Chronic obstructive pulmonary disease (HCC) 10/03/2016   Neck pain 10/03/2016   Skin lesion 05/06/2016   Olecranon bursitis 03/07/2016   Knee pain 01/08/2016   Primary osteoarthritis of left knee 01/04/2016   Cardiomyopathy, ischemic 01/04/2016   HLD (hyperlipidemia) 01/04/2016   Hypothyroidism 11/19/2015   Advance care planning 11/19/2015   Atrial fibrillation (HCC) 06/29/2015   Biceps tendon rupture 03/24/2015   COPD with acute exacerbation (HCC) 07/11/2014   Congestive dilated cardiomyopathy (HCC) 07/11/2014   Chronic systolic CHF (  congestive heart failure) (HCC) 07/11/2014   Essential hypertension 07/11/2014    Conditions to be addressed/monitored: Atrial Fibrillation; Limited access to caregiver and Lacks knowledge of community resource:    Care Plan : LCSW Plan of Care  Updates made by Buck Mam, LCSW since 05/16/2021 12:00 AM     Problem: Caregiver Stress      Goal: Provide resources, support and care plan to  assist with Caregiver Coping/Stress   Start Date: 12/05/2020  Expected End Date: 06/18/2021  This Visit's Progress: On track  Recent Progress: On track  Priority: High  Note:   Current barriers:    Limited social support, Lacks knowledge of community resource: caregiver stress, and resources Clinical Goals: Patient will work with Care Guide and CSW to address needs related to caregiver stress/needs Clinical Interventions:  Spoke with pt's wife  who reports no needs- she is indicating they have not pursued any careegiver support/services in the home yet.  Attempted to reach son as well for updates and was unable to reach.   Collaboration with [name], MD regarding development and update of comprehensive plan of care as evidenced by provider attestation and co-signature Inter-disciplinary care team collaboration (see longitudinal plan of care) Assessment of needs, barriers , agencies contacted, as well as how impacting  Review various resources, discussed options and provided patient information about Referral to care guide for community support and other options   Services provided by [employer]  Dementia resources and support  Patient interviewed and appropriate assessments performed Referred patient to community resources care guide team for assistance with Adult Day Care resources for wife, PCS application, senior resource center /activities Clinical interventions provided:Solution-Focused Strategies, Active listening / Reflection utilized , and Caregiver stress acknowledged  Patient Goals/Self-Care Activities: Over the next 30 days I have place a referral to the care guide for community resources, they will follow up with you         Follow Up Plan: Appointment scheduled for SW follow up with client by phone on: 06/06/21      Reece Levy MSW, LCSW Licensed Clinical Social Worker [facility] [phone]

## 2021-05-16 NOTE — Patient Instructions (Signed)
Visit Information  Thank you for taking time to visit with me today. Please don't hesitate to contact me if I can be of assistance to you before our next scheduled telephone appointment.  Following are the goals we discussed today:  (Copy and paste patient goals from clinical care plan here)  Our next appointment is by telephone on 06/04/21 at 11am  Please call the care guide team at [phone] if you need to cancel or reschedule your appointment.   If you are experiencing a Mental Health or Behavioral Health Crisis or need someone to talk to, please call the Suicide and Crisis Lifeline: 988 call the [place] National Suicide Prevention Lifeline: 808-054-0122 or TTY: 231-751-5942 TTY 925-812-7589) to talk to a trained counselor call 911   The patient verbalized understanding of instructions, educational materials, and care plan provided today and declined offer to receive copy of patient instructions, educational materials, and care plan.   Reece Levy MSW, LCSW Licensed Clinical Social Worker [facility] [place] [phone]

## 2021-05-19 DIAGNOSIS — I10 Essential (primary) hypertension: Secondary | ICD-10-CM

## 2021-05-19 DIAGNOSIS — J449 Chronic obstructive pulmonary disease, unspecified: Secondary | ICD-10-CM

## 2021-06-04 ENCOUNTER — Telehealth: Payer: Medicare Other

## 2021-06-06 ENCOUNTER — Telehealth: Payer: Medicare Other

## 2021-07-06 ENCOUNTER — Telehealth: Payer: Medicare Other

## 2021-08-02 ENCOUNTER — Other Ambulatory Visit: Payer: Self-pay | Admitting: Cardiovascular Disease

## 2021-08-02 NOTE — Telephone Encounter (Signed)
Prescription refill request for Eliquis received. ?Indication: Atrial fib ?Last office visit: 01/15/21  Johnny Bridge MD ?Scr: 1.15 on 11/23/20 ?Age: 76 ?Weight: 63.7kg ? ?Based on above findings Eliquis 5mg  twice daily is the appropriate dose.  Refill approved. ? ?

## 2021-08-02 NOTE — Telephone Encounter (Signed)
Please review

## 2021-08-22 ENCOUNTER — Other Ambulatory Visit: Payer: Self-pay | Admitting: Family Medicine

## 2021-08-27 ENCOUNTER — Ambulatory Visit (INDEPENDENT_AMBULATORY_CARE_PROVIDER_SITE_OTHER): Payer: Medicare Other

## 2021-08-27 VITALS — Ht 70.0 in | Wt 155.0 lb

## 2021-08-27 DIAGNOSIS — Z0000 Encounter for general adult medical examination without abnormal findings: Secondary | ICD-10-CM

## 2021-08-27 DIAGNOSIS — I499 Cardiac arrhythmia, unspecified: Secondary | ICD-10-CM | POA: Insufficient documentation

## 2021-08-27 NOTE — Progress Notes (Signed)
? ?Subjective:  ? Devin Ramsey is a 76 y.o. male who presents for Medicare Annual/Subsequent preventive examination. ?Virtual Visit via Telephone Note ? ?I connected with  Devin Ramsey on 08/27/21 at  9:00 AM EDT by telephone and verified that I am speaking with the correct person using two identifiers. ? ?Location: ?Patient: HOME ?Provider: [facility]    ?Persons participating in the virtual visit: patient/Nurse Health Advisor ?  ?I discussed the limitations, risks, security and privacy concerns of performing an evaluation and management service by telephone and the availability of in person appointments. The patient expressed understanding and agreed to proceed. ? ?Interactive audio and video telecommunications were attempted between this nurse and patient, however failed, due to patient having technical difficulties OR patient did not have access to video capability.  We continued and completed visit with audio only. ? ?Some vital signs may be absent or patient reported.  ? ?[name], LPN ? ?Review of Systems    ? ?Cardiac Risk Factors include: advanced age (>52men, >70 women);hypertension;male gender;sedentary lifestyle;Other (see comment), Risk factor comments: CHF, COPD, AFIB ? ?   ?Objective:  ?  ?Today's Vitals  ? 08/27/21 0900  ?Weight: 155 lb (70.3 kg)  ?Height: 5\' 10"  (1.778 m)  ? ?Body mass index is 22.24 kg/m?. ? ? ?  08/27/2021  ?  9:09 AM 12/06/2020  ? 10:18 AM 08/25/2020  ?  9:03 AM 10/28/2019  ?  9:58 AM 07/28/2019  ? 10:08 AM 07/15/2019  ?  3:02 PM 01/15/2019  ?  9:36 AM  ?Advanced Directives  ?Does Patient Have a Medical Advance Directive? No No No No No No No  ?Would patient like information on creating a medical advance directive? No - Patient declined  No - Patient declined  No - Patient declined    ? ? ?Current Medications (verified) ?Outpatient Encounter Medications as of 08/27/2021  ?Medication Sig  ? apixaban (ELIQUIS) 5 MG TABS tablet Take 1 tablet by mouth twice daily  ? furosemide  (LASIX) 20 MG tablet Take 1 tablet (20 mg total) by mouth daily as needed.  ? hydrOXYzine (ATARAX) 10 MG tablet TAKE 1/2 TO 1 (ONE-HALF TO ONE) TABLET BY MOUTH EVERY 8 HOURS AS NEEDED FOR  ITCHING  ? levothyroxine (SYNTHROID) 88 MCG tablet Take 1 tablet (88 mcg total) by mouth daily.  ? predniSONE (DELTASONE) 10 MG tablet TAKE 2 TABLETS BY MOUTH ONCE DAILY AS NEEDED WITH FOOD FOR GOUT  ? rosuvastatin (CRESTOR) 10 MG tablet Take 1 tablet (10 mg total) by mouth daily.  ? sacubitril-valsartan (ENTRESTO) 24-26 MG Take 1 tablet by mouth 2 (two) times daily.  ? tiotropium (SPIRIVA HANDIHALER) 18 MCG inhalation capsule Place 1 capsule (18 mcg total) into inhaler and inhale daily.  ? traMADol (ULTRAM) 50 MG tablet TAKE 2 TABLETS BY MOUTH EVERY 12 HOURS AS NEEDED FOR  KNEE  PAIN  ? triamcinolone cream (KENALOG) 0.1 % Apply topically daily as needed.  ? vitamin B-12 (CYANOCOBALAMIN) 1000 MCG tablet Take 1,000 mcg by mouth daily.  ? ?No facility-administered encounter medications on file as of 08/27/2021.  ? ? ?Allergies (verified) ?Codeine and Indocin [indomethacin]  ? ?History: ?Past Medical History:  ?Diagnosis Date  ? Anemia   ? B12 deficiency 10/28/2019  ? Bilateral pleural effusion   ? Chronic systolic CHF (congestive heart failure) (HCC)   ? a. TTE 2/16: EF of 35-40%, WMA consistent with BBB, normal sized LA; b. TTE 11/16: F of 35-40%, mild concentric LVH,  no RWMA, normal LV diastolic function parameters, mild to moderate AI, moderate MR, mildly to moderately dilated left atrium measuring 42 mm  ? COPD (chronic obstructive pulmonary disease) (HCC)   ? Essential hypertension   ? Gout   ? HLD (hyperlipidemia)   ? Hypothyroidism   ? LBBB (left bundle branch block)   ? Permanent atrial fibrillation (HCC)   ? a. CHADS2VSC => 4 (CHF, HTN, age x 1, vascular disease); b. on Eliquis  ? Pneumonia 2016  ? Tobacco abuse   ? ?Past Surgical History:  ?Procedure Laterality Date  ? HEMORRHOID SURGERY    ? SHOULDER SURGERY    ? right  shoulder   ? TONSILLECTOMY    ? ?Family History  ?Adopted: Yes  ?Problem Relation Age of Onset  ? Colon cancer Neg Hx   ? Prostate cancer Neg Hx   ? ?Social History  ? ?Socioeconomic History  ? Marital status: Married  ?  Spouse name: Not on file  ? Number of children: Not on file  ? Years of education: Not on file  ? Highest education level: Not on file  ?Occupational History  ? Not on file  ?Tobacco Use  ? Smoking status: Some Days  ?  Packs/day: 0.25  ?  Years: 56.00  ?  Pack years: 14.00  ?  Types: Cigarettes  ?  Last attempt to quit: 06/27/2014  ?  Years since quitting: 7.1  ? Smokeless tobacco: Never  ?Vaping Use  ? Vaping Use: Never used  ?Substance and Sexual Activity  ? Alcohol use: Yes  ?  Alcohol/week: 15.0 standard drinks  ?  Types: 15 Cans of beer per week  ?  Comment: 2-3 beers a day average  ? Drug use: No  ? Sexual activity: Not Currently  ?Other Topics Concern  ? Not on file  ?Social History Narrative  ? Married 1966  ? From [place]   ? [employer] fan  ? 1 son, local  ? ?Social Determinants of Health  ? ?Financial Resource Strain: Low Risk   ? Difficulty of Paying Living Expenses: Not hard at all  ?Food Insecurity: No Food Insecurity  ? Worried About Programme researcher, broadcasting/film/video in the Last Year: Never true  ? Ran Out of Food in the Last Year: Never true  ?Transportation Needs: No Transportation Needs  ? Lack of Transportation (Medical): No  ? Lack of Transportation (Non-Medical): No  ?Physical Activity: Insufficiently Active  ? Days of Exercise per Week: 2 days  ? Minutes of Exercise per Session: 10 min  ?Stress: No Stress Concern Present  ? Feeling of Stress : Not at all  ?Social Connections: Socially Integrated  ? Frequency of Communication with Friends and Family: More than three times a week  ? Frequency of Social Gatherings with Friends and Family: More than three times a week  ? Attends Religious Services: More than 4 times per year  ? Active Member of Clubs or Organizations: Yes  ? Attends Banker  Meetings: More than 4 times per year  ? Marital Status: Married  ? ? ?Tobacco Counseling ?Ready to quit: Not Answered ?Counseling given: Not Answered ? ? ?Clinical Intake: ? ?Pre-visit preparation completed: Yes ? ?Pain : No/denies pain ? ?  ? ?BMI - recorded: 22.24 ?Nutritional Status: BMI of 19-24  Normal ?Nutritional Risks: None ?Diabetes: No ? ?How often do you need to have someone help you when you read instructions, pamphlets, or other written materials from your doctor or  pharmacy?: 1 - Never ? ?Diabetic?NO ? ?Interpreter Needed?: No ? ?Information entered by :: mj Chidera Dearcos, lpn ? ? ?Activities of Daily Living ? ?  08/27/2021  ?  9:11 AM  ?In your present state of health, do you have any difficulty performing the following activities:  ?Hearing? 1  ?Vision? 0  ?Difficulty concentrating or making decisions? 0  ?Walking or climbing stairs? 0  ?Dressing or bathing? 0  ?Doing errands, shopping? 0  ?Preparing Food and eating ? N  ?Using the Toilet? N  ?In the past six months, have you accidently leaked urine? N  ?Do you have problems with loss of bowel control? N  ?Managing your Medications? N  ?Managing your Finances? N  ?Housekeeping or managing your Housekeeping? N  ? ? ?Patient Care Team: ?[name], MD as PCP - General (Family Medicine) ?Antonieta Iba, MD as PCP - Cardiology (Cardiology) ?Antonieta Iba, MD as Consulting Physician (Cardiology) ?Rickard Patience, MD as Consulting Physician (Oncology) ?Buck Mam, LCSW as Social Worker ? ?Indicate any recent Medical Services you may have received from other than [facility] providers in the past year (date may be approximate). ? ?   ?Assessment:  ? This is a routine wellness examination for Lolo. ? ?Hearing/Vision screen ?Hearing Screening - Comments:: Some hearing issues with background noise. ?Vision Screening - Comments:: Glasses. [facility]. 2022. ? ?Dietary issues and exercise activities discussed: ?Current Exercise Habits: Home exercise  routine, Type of exercise: walking, Time (Minutes): 15, Frequency (Times/Week): 2, Weekly Exercise (Minutes/Week): 30, Intensity: Mild, Exercise limited by: cardiac condition(s);respiratory conditions(s) ? ? Goals

## 2021-08-27 NOTE — Patient Instructions (Signed)
Devin Ramsey , ?Thank you for taking time to come for your Medicare Wellness Visit. I appreciate your ongoing commitment to your health goals. Please review the following plan we discussed and let me know if I can assist you in the future.  ? ?Screening recommendations/referrals: ?Colonoscopy: No longer required due to age.  ? ?Recommended yearly ophthalmology/optometry visit for glaucoma screening and checkup ?Recommended yearly dental visit for hygiene and checkup ? ?Vaccinations: ?Influenza vaccine: Done 03/18/2020 Repeat annually ? ?Pneumococcal vaccine: Done 07/11/2015 and 06/20/2014 ?Tdap vaccine: Due. Repeat in 10 years ? ?Shingles vaccine: Discussed.  ?Covid-19: Declined.  ? ?Advanced directives: Advance directive discussed with you today. Even though you declined this today, please call our office should you change your mind, and we can give you the proper paperwork for you to fill out. ? ? ?Conditions/risks identified: Advance directive discussed with you today. Even though you declined this today, please call our office should you change your mind, and we can give you the proper paperwork for you to fill out. ? ? ?Next appointment: Follow up in one year for your annual wellness visit. 2024. ? ?Preventive Care 27 Years and Older, Male ? ?Preventive care refers to lifestyle choices and visits with your health care provider that can promote health and wellness. ?What does preventive care include? ?A yearly physical exam. This is also called an annual well check. ?Dental exams once or twice a year. ?Routine eye exams. Ask your health care provider how often you should have your eyes checked. ?Personal lifestyle choices, including: ?Daily care of your teeth and gums. ?Regular physical activity. ?Eating a healthy diet. ?Avoiding tobacco and drug use. ?Limiting alcohol use. ?Practicing safe sex. ?Taking low doses of aspirin every day. ?Taking vitamin and mineral supplements as recommended by your health care  provider. ?What happens during an annual well check? ?The services and screenings done by your health care provider during your annual well check will depend on your age, overall health, lifestyle risk factors, and family history of disease. ?Counseling  ?Your health care provider may ask you questions about your: ?Alcohol use. ?Tobacco use. ?Drug use. ?Emotional well-being. ?Home and relationship well-being. ?Sexual activity. ?Eating habits. ?History of falls. ?Memory and ability to understand (cognition). ?Work and work Astronomer. ?Screening  ?You may have the following tests or measurements: ?Height, weight, and BMI. ?Blood pressure. ?Lipid and cholesterol levels. These may be checked every 5 years, or more frequently if you are over 85 years old. ?Skin check. ?Lung cancer screening. You may have this screening every year starting at age 55 if you have a 30-pack-year history of smoking and currently smoke or have quit within the past 15 years. ?Fecal occult blood test (FOBT) of the stool. You may have this test every year starting at age 61. ?Flexible sigmoidoscopy or colonoscopy. You may have a sigmoidoscopy every 5 years or a colonoscopy every 10 years starting at age 55. ?Prostate cancer screening. Recommendations will vary depending on your family history and other risks. ?Hepatitis C blood test. ?Hepatitis B blood test. ?Sexually transmitted disease (STD) testing. ?Diabetes screening. This is done by checking your blood sugar (glucose) after you have not eaten for a while (fasting). You may have this done every 1-3 years. ?Abdominal aortic aneurysm (AAA) screening. You may need this if you are a current or former smoker. ?Osteoporosis. You may be screened starting at age 36 if you are at high risk. ?Talk with your health care provider about your test results, treatment options,  and if necessary, the need for more tests. ?Vaccines  ?Your health care provider may recommend certain vaccines, such  as: ?Influenza vaccine. This is recommended every year. ?Tetanus, diphtheria, and acellular pertussis (Tdap, Td) vaccine. You may need a Td booster every 10 years. ?Zoster vaccine. You may need this after age 49. ?Pneumococcal 13-valent conjugate (PCV13) vaccine. One dose is recommended after age 56. ?Pneumococcal polysaccharide (PPSV23) vaccine. One dose is recommended after age 72. ?Talk to your health care provider about which screenings and vaccines you need and how often you need them. ?This information is not intended to replace advice given to you by your health care provider. Make sure you discuss any questions you have with your health care provider. ?Document Released: 06/02/2015 Document Revised: 01/24/2016 Document Reviewed: 03/07/2015 ?Elsevier Interactive Patient Education ? 2017 [employer]. ? ?Fall Prevention in the Home ?Falls can cause injuries. They can happen to people of all ages. There are many things you can do to make your home safe and to help prevent falls. ?What can I do on the outside of my home? ?Regularly fix the edges of walkways and driveways and fix any cracks. ?Remove anything that might make you trip as you walk through a door, such as a raised step or threshold. ?Trim any bushes or trees on the path to your home. ?Use bright outdoor lighting. ?Clear any walking paths of anything that might make someone trip, such as rocks or tools. ?Regularly check to see if handrails are loose or broken. Make sure that both sides of any steps have handrails. ?Any raised decks and porches should have guardrails on the edges. ?Have any leaves, snow, or ice cleared regularly. ?Use sand or salt on walking paths during winter. ?Clean up any spills in your garage right away. This includes oil or grease spills. ?What can I do in the bathroom? ?Use night lights. ?Install grab bars by the toilet and in the tub and shower. Do not use towel bars as grab bars. ?Use non-skid mats or decals in the tub or  shower. ?If you need to sit down in the shower, use a plastic, non-slip stool. ?Keep the floor dry. Clean up any water that spills on the floor as soon as it happens. ?Remove soap buildup in the tub or shower regularly. ?Attach bath mats securely with double-sided non-slip rug tape. ?Do not have throw rugs and other things on the floor that can make you trip. ?What can I do in the bedroom? ?Use night lights. ?Make sure that you have a light by your bed that is easy to reach. ?Do not use any sheets or blankets that are too big for your bed. They should not hang down onto the floor. ?Have a firm chair that has side arms. You can use this for support while you get dressed. ?Do not have throw rugs and other things on the floor that can make you trip. ?What can I do in the kitchen? ?Clean up any spills right away. ?Avoid walking on wet floors. ?Keep items that you use a lot in easy-to-reach places. ?If you need to reach something above you, use a strong step stool that has a grab bar. ?Keep electrical cords out of the way. ?Do not use floor polish or wax that makes floors slippery. If you must use wax, use non-skid floor wax. ?Do not have throw rugs and other things on the floor that can make you trip. ?What can I do with my stairs? ?Do not leave  any items on the stairs. ?Make sure that there are handrails on both sides of the stairs and use them. Fix handrails that are broken or loose. Make sure that handrails are as long as the stairways. ?Check any carpeting to make sure that it is firmly attached to the stairs. Fix any carpet that is loose or worn. ?Avoid having throw rugs at the top or bottom of the stairs. If you do have throw rugs, attach them to the floor with carpet tape. ?Make sure that you have a light switch at the top of the stairs and the bottom of the stairs. If you do not have them, ask someone to add them for you. ?What else can I do to help prevent falls? ?Wear shoes that: ?Do not have high heels. ?Have  rubber bottoms. ?Are comfortable and fit you well. ?Are closed at the toe. Do not wear sandals. ?If you use a stepladder: ?Make sure that it is fully opened. Do not climb a closed stepladder. ?Make sure that both sides of

## 2021-09-27 ENCOUNTER — Telehealth: Payer: Self-pay | Admitting: Family Medicine

## 2021-09-27 NOTE — Telephone Encounter (Signed)
Pt call and stated he need a refill on med called : clovetasol and I did not see this on his med list . Pt stated dr Damita Dunnings know what he talking about ?

## 2021-09-27 NOTE — Telephone Encounter (Signed)
Called patient to clarify rx requested. Patient would like a refill on Clobetasol cream for when he gets poison oak. If approved he would like rx sent to [facility] on [address].  ?

## 2021-09-28 MED ORDER — CLOBETASOL PROPIONATE 0.05 % EX CREA: 1.0000 "application " | TOPICAL_CREAM | Freq: Two times a day (BID) | CUTANEOUS | 1 refills | Status: AC | PRN

## 2021-09-28 NOTE — Telephone Encounter (Signed)
Sent. Thanks.   

## 2021-09-28 NOTE — Addendum Note (Signed)
Addended by: [name] on: 09/28/2021 08:06 AM ? ? Modules accepted: Orders ? ?

## 2021-10-04 ENCOUNTER — Ambulatory Visit (INDEPENDENT_AMBULATORY_CARE_PROVIDER_SITE_OTHER): Payer: Medicare Other | Admitting: Family Medicine

## 2021-10-04 ENCOUNTER — Encounter: Payer: Self-pay | Admitting: Family Medicine

## 2021-10-04 VITALS — BP 120/76 | HR 63 | Temp 97.3°F

## 2021-10-04 DIAGNOSIS — R6889 Other general symptoms and signs: Secondary | ICD-10-CM | POA: Diagnosis not present

## 2021-10-04 DIAGNOSIS — I10 Essential (primary) hypertension: Secondary | ICD-10-CM | POA: Diagnosis not present

## 2021-10-04 DIAGNOSIS — I5022 Chronic systolic (congestive) heart failure: Secondary | ICD-10-CM

## 2021-10-04 DIAGNOSIS — E039 Hypothyroidism, unspecified: Secondary | ICD-10-CM

## 2021-10-04 DIAGNOSIS — E782 Mixed hyperlipidemia: Secondary | ICD-10-CM

## 2021-10-04 DIAGNOSIS — M109 Gout, unspecified: Secondary | ICD-10-CM

## 2021-10-04 DIAGNOSIS — L299 Pruritus, unspecified: Secondary | ICD-10-CM

## 2021-10-04 DIAGNOSIS — I4821 Permanent atrial fibrillation: Secondary | ICD-10-CM

## 2021-10-04 DIAGNOSIS — E538 Deficiency of other specified B group vitamins: Secondary | ICD-10-CM

## 2021-10-04 LAB — VITAMIN B12: Vitamin B-12: 356 pg/mL (ref 211–911)

## 2021-10-04 LAB — CBC WITH DIFFERENTIAL/PLATELET
Basophils Absolute: 0 10*3/uL (ref 0.0–0.1)
Basophils Relative: 0.8 % (ref 0.0–3.0)
Eosinophils Absolute: 0.1 10*3/uL (ref 0.0–0.7)
Eosinophils Relative: 2.1 % (ref 0.0–5.0)
HCT: 37.3 % — ABNORMAL LOW (ref 39.0–52.0)
Hemoglobin: 12.8 g/dL — ABNORMAL LOW (ref 13.0–17.0)
Lymphocytes Relative: 25.2 % (ref 12.0–46.0)
Lymphs Abs: 1.2 10*3/uL (ref 0.7–4.0)
MCHC: 34.3 g/dL (ref 30.0–36.0)
MCV: 105.6 fl — ABNORMAL HIGH (ref 78.0–100.0)
Monocytes Absolute: 0.3 10*3/uL (ref 0.1–1.0)
Monocytes Relative: 7.6 % (ref 3.0–12.0)
Neutro Abs: 2.9 10*3/uL (ref 1.4–7.7)
Neutrophils Relative %: 64.3 % (ref 43.0–77.0)
Platelets: 111 10*3/uL — ABNORMAL LOW (ref 150.0–400.0)
RBC: 3.53 Mil/uL — ABNORMAL LOW (ref 4.22–5.81)
RDW: 14 % (ref 11.5–15.5)
WBC: 4.6 10*3/uL (ref 4.0–10.5)

## 2021-10-04 LAB — LIPID PANEL
Cholesterol: 109 mg/dL (ref 0–200)
HDL: 55.6 mg/dL (ref >39.00)
LDL Cholesterol: 43 mg/dL (ref 0–99)
NonHDL: 53.02
Total CHOL/HDL Ratio: 2
Triglycerides: 49 mg/dL (ref 0.0–149.0)
VLDL: 9.8 mg/dL (ref 0.0–40.0)

## 2021-10-04 LAB — COMPREHENSIVE METABOLIC PANEL
ALT: 8 U/L (ref 0–53)
AST: 14 U/L (ref 0–37)
Albumin: 3.8 g/dL (ref 3.5–5.2)
Alkaline Phosphatase: 80 U/L (ref 39–117)
BUN: 11 mg/dL (ref 6–23)
CO2: 29 mEq/L (ref 19–32)
Calcium: 8.3 mg/dL — ABNORMAL LOW (ref 8.4–10.5)
Chloride: 97 mEq/L (ref 96–112)
Creatinine, Ser: 1.19 mg/dL (ref 0.40–1.50)
GFR: 59.18 mL/min — ABNORMAL LOW (ref >60.00)
Glucose, Bld: 100 mg/dL — ABNORMAL HIGH (ref 70–99)
Potassium: 4.1 mEq/L (ref 3.5–5.1)
Sodium: 130 mEq/L — ABNORMAL LOW (ref 135–145)
Total Bilirubin: 1.4 mg/dL — ABNORMAL HIGH (ref 0.2–1.2)
Total Protein: 5.8 g/dL — ABNORMAL LOW (ref 6.0–8.3)

## 2021-10-04 LAB — URIC ACID: Uric Acid, Serum: 6.3 mg/dL (ref 4.0–7.8)

## 2021-10-04 LAB — TSH: TSH: 9.84 u[IU]/mL — ABNORMAL HIGH (ref 0.35–5.50)

## 2021-10-04 MED ORDER — PREDNISONE 10 MG PO TABS: ORAL_TABLET | ORAL | 1 refills | Status: AC

## 2021-10-04 NOTE — Progress Notes (Signed)
Neck sx.  He feels something with neck ROM but not painful.  The affected area is smaller now than prev.  No drainage.  No FCNAV.  No dysphagia.    D/w pt about having prednisone on hand for a gout flare.  He used it last week with a flare with relief.  No gout pain now.  No foot pain now.    He has used hydroxyzine for itching with relief.  Using daily with relief.    H/o CHF.  On entresto with prn use of lasix, not daily- rare use overall.  Labs pending.  Still anticoagulated due to h/o AF.  No bleeding.  He has trouble walking from leg pain at baseline.  This is not changed.    Hypothyroidism.  Still on tx with TSH pending.  See notes on labs.    H/o B12 def not on replacement with labs pending.  See notes on labs.    Wife had memory loss.  D/w pt.  His son and his family live with patient/wife.  Son is helping with med admin.  D/ wpt.    Meds, vitals, and allergies reviewed.   ROS: Per HPI unless specifically indicated in ROS section   Nad Ncat MMM Small scratch on the L side of the jaw but no LA or salivary gland enlargement.   No TMG.   IRR Ctab No BLE edema.  In wheelchair at baseline.

## 2021-10-04 NOTE — Patient Instructions (Addendum)
Go to the lab on the way out.   If you have mychart we'll likely use that to update you.    Stop the rosuvastatin for about 10 days and see if the leg pain gets better.  This could have been from a salivary gland or lymph node irritation/enlargement.  Update me as needed.   Take care.  Glad to see you.

## 2021-10-07 ENCOUNTER — Other Ambulatory Visit: Payer: Self-pay | Admitting: Family Medicine

## 2021-10-07 DIAGNOSIS — R6889 Other general symptoms and signs: Secondary | ICD-10-CM | POA: Insufficient documentation

## 2021-10-07 DIAGNOSIS — E538 Deficiency of other specified B group vitamins: Secondary | ICD-10-CM

## 2021-10-07 DIAGNOSIS — E039 Hypothyroidism, unspecified: Secondary | ICD-10-CM

## 2021-10-07 MED ORDER — LEVOTHYROXINE SODIUM 100 MCG PO TABS: 100.0000 ug | ORAL_TABLET | Freq: Every day | ORAL | 1 refills | Status: AC

## 2021-10-07 NOTE — Assessment & Plan Note (Addendum)
The affected area is smaller now compared to previous.  He reports that he has improved in the meantime.  It is unclear to me if he had salivary gland enlargement that has decompressed in the meantime or an irritated lymph node that has decreased in size.  I do not feel any mass or abnormality currently.  He can observe and update me as needed, if he has any recurrent symptoms.

## 2021-10-07 NOTE — Assessment & Plan Note (Signed)
Reasonable to hold statin for few days and see if his leg pain improves.  See after visit summary.

## 2021-10-07 NOTE — Assessment & Plan Note (Signed)
No symptoms currently.  He can have prednisone on hand to use in case he has a flare.  He limits use of prednisone is much as possible.

## 2021-10-07 NOTE — Assessment & Plan Note (Signed)
Continue levothyroxine.  See notes on TSH.

## 2021-10-07 NOTE — Assessment & Plan Note (Signed)
Continue anticoagulation with Eliquis.  See notes on labs.

## 2021-10-07 NOTE — Assessment & Plan Note (Signed)
Continue hydroxyzine.  It helps with itching.

## 2021-10-07 NOTE — Assessment & Plan Note (Signed)
Continue Entresto with as needed Lasix.  Not using Lasix frequently.  See notes on labs.

## 2021-10-07 NOTE — Assessment & Plan Note (Signed)
See notes on labs. 

## 2021-11-26 ENCOUNTER — Other Ambulatory Visit: Payer: Self-pay | Admitting: Family Medicine

## 2021-11-29 ENCOUNTER — Ambulatory Visit: Payer: Self-pay | Admitting: *Deleted

## 2021-11-29 ENCOUNTER — Telehealth: Payer: Self-pay | Admitting: *Deleted

## 2021-11-29 NOTE — Chronic Care Management (AMB) (Signed)
error 

## 2021-11-29 NOTE — Telephone Encounter (Signed)
  Care Management   Follow Up Note   11/29/2021 Name: Devin Ramsey MRN: [id number] DOB: 27-Feb-1946   Referred by: [name], MD Reason for referral : No chief complaint on file.   Third unsuccessful telephone outreach was attempted today. The patient was referred to the case management team for assistance with care management and care coordination. The patient's primary care provider has been notified of our unsuccessful attempts to make or maintain contact with the patient. The care management team is pleased to engage with this patient at any time in the future should he/she be interested in assistance from the care management team.   Follow Up Plan: We have been unable to make contact with the patient for follow up. The care management team is available to follow up with the patient after provider conversation with the patient regarding recommendation for care management engagement and subsequent re-referral to the care management team.  Reece Levy MSW, LCSW Licensed Clinical Social Worker [facility] [place]   [phone]

## 2021-12-14 ENCOUNTER — Ambulatory Visit: Payer: Medicare Other | Admitting: Nurse Practitioner

## 2021-12-19 ENCOUNTER — Other Ambulatory Visit: Payer: Self-pay | Admitting: Family Medicine

## 2021-12-31 ENCOUNTER — Emergency Department
Admission: EM | Admit: 2021-12-31 | Discharge: 2021-12-31 | Disposition: A | Discharge status: Home / Self Care | Payer: Medicare Other | Attending: Emergency Medicine | Admitting: Emergency Medicine

## 2021-12-31 ENCOUNTER — Other Ambulatory Visit: Payer: Self-pay

## 2021-12-31 DIAGNOSIS — E860 Dehydration: Secondary | ICD-10-CM | POA: Diagnosis not present

## 2021-12-31 DIAGNOSIS — I509 Heart failure, unspecified: Secondary | ICD-10-CM | POA: Diagnosis not present

## 2021-12-31 DIAGNOSIS — I959 Hypotension, unspecified: Secondary | ICD-10-CM | POA: Insufficient documentation

## 2021-12-31 DIAGNOSIS — I110 Hypertensive heart disease with heart failure: Secondary | ICD-10-CM | POA: Insufficient documentation

## 2021-12-31 DIAGNOSIS — I499 Cardiac arrhythmia, unspecified: Secondary | ICD-10-CM | POA: Diagnosis not present

## 2021-12-31 DIAGNOSIS — R0689 Other abnormalities of breathing: Secondary | ICD-10-CM | POA: Diagnosis not present

## 2021-12-31 DIAGNOSIS — F0390 Unspecified dementia without behavioral disturbance: Secondary | ICD-10-CM | POA: Insufficient documentation

## 2021-12-31 DIAGNOSIS — J449 Chronic obstructive pulmonary disease, unspecified: Secondary | ICD-10-CM | POA: Diagnosis not present

## 2021-12-31 DIAGNOSIS — R531 Weakness: Secondary | ICD-10-CM | POA: Insufficient documentation

## 2021-12-31 DIAGNOSIS — R001 Bradycardia, unspecified: Secondary | ICD-10-CM | POA: Diagnosis not present

## 2021-12-31 DIAGNOSIS — R6889 Other general symptoms and signs: Secondary | ICD-10-CM | POA: Diagnosis not present

## 2021-12-31 DIAGNOSIS — Z743 Need for continuous supervision: Secondary | ICD-10-CM | POA: Diagnosis not present

## 2021-12-31 LAB — URINALYSIS, ROUTINE W REFLEX MICROSCOPIC
Bilirubin Urine: NEGATIVE
Glucose, UA: NEGATIVE mg/dL
Ketones, ur: NEGATIVE mg/dL
Leukocytes,Ua: NEGATIVE
Nitrite: NEGATIVE
Protein, ur: NEGATIVE mg/dL
Specific Gravity, Urine: 1.013 (ref 1.005–1.030)
pH: 5 (ref 5.0–8.0)

## 2021-12-31 LAB — CBC
HCT: 35.9 % — ABNORMAL LOW (ref 39.0–52.0)
Hemoglobin: 12 g/dL — ABNORMAL LOW (ref 13.0–17.0)
MCH: 33.7 pg (ref 26.0–34.0)
MCHC: 33.4 g/dL (ref 30.0–36.0)
MCV: 100.8 fL — ABNORMAL HIGH (ref 80.0–100.0)
Platelets: 167 10*3/uL (ref 150–400)
RBC: 3.56 MIL/uL — ABNORMAL LOW (ref 4.22–5.81)
RDW: 12.5 % (ref 11.5–15.5)
WBC: 11 10*3/uL — ABNORMAL HIGH (ref 4.0–10.5)
nRBC: 0 % (ref 0.0–0.2)

## 2021-12-31 LAB — BASIC METABOLIC PANEL
Anion gap: 8 (ref 5–15)
BUN: 16 mg/dL (ref 8–23)
CO2: 21 mmol/L — ABNORMAL LOW (ref 22–32)
Calcium: 7.7 mg/dL — ABNORMAL LOW (ref 8.9–10.3)
Chloride: 102 mmol/L (ref 98–111)
Creatinine, Ser: 1.38 mg/dL — ABNORMAL HIGH (ref 0.61–1.24)
GFR, Estimated: 53 mL/min — ABNORMAL LOW (ref >60)
Glucose, Bld: 96 mg/dL (ref 70–99)
Potassium: 3.3 mmol/L — ABNORMAL LOW (ref 3.5–5.1)
Sodium: 131 mmol/L — ABNORMAL LOW (ref 135–145)

## 2021-12-31 MED ORDER — SODIUM CHLORIDE 0.9 % IV BOLUS
500.0000 mL | Freq: Once | INTRAVENOUS | Status: AC
  Administered 2021-12-31: 500 mL via INTRAVENOUS

## 2021-12-31 MED ORDER — LACTATED RINGERS IV BOLUS
1000.0000 mL | Freq: Once | INTRAVENOUS | Status: AC
  Administered 2021-12-31: 1000 mL via INTRAVENOUS

## 2021-12-31 NOTE — ED Triage Notes (Addendum)
Arrived by [facility] from home. Patient c/o progressive weakness X3 weeks.   EMS vitals:  72/40 blood pressure 40-110 HR a-fib 111 blood sugar  EMS administered 800cc normal saline

## 2021-12-31 NOTE — ED Provider Notes (Addendum)
[facility] Provider Note    Event Date/Time   First MD Initiated Contact with Patient 12/31/21 1243     (approximate)   History   Chief Complaint: Hypotension   HPI  Devin Ramsey is a 76 y.o. male with a past history of COPD, atrial fibrillation on Eliquis, hypertension, CHF who comes the ED complaining of generalized weakness for the past 3 weeks, gradual onset and worsening.  Patient notes that his wife has progressive dementia, and she often forgets to cook and he does not feel motivated to make any food for himself, so he frequently skips meals and eats snacks when he is hungry.  He is drinking fluids, but also reports that he stays in bed for most of the day and uses a bucket next to the bed to urinate.  Denies any chest pain shortness of breath abdominal pain vomiting diarrhea or black or bloody stool.  Denies dysuria     Physical Exam   Triage Vital Signs: ED Triage Vitals [12/31/21 1118]  Enc Vitals Group     BP (!) 86/53     Pulse Rate 100     Resp 18     Temp 98 F (36.7 C)     Temp Source Oral     SpO2 95 %     Weight      Height      Head Circumference      Peak Flow      Pain Score 0     Pain Loc      Pain Edu?      Excl. in GC?     Most recent vital signs: Vitals:   12/31/21 1118 12/31/21 1245  BP: (!) 86/53 118/66  Pulse: 100 95  Resp: 18 18  Temp: 98 F (36.7 C)   SpO2: 95% 96%    General: Awake, no distress.  CV:  Good peripheral perfusion.  regular rhythm, heart rate 100 Resp:  Normal effort.  Clear to auscultation bilaterally Abd:  No distention.  Soft nontender Other:  No wounds, no inflammatory soft tissue changes or ecchymosis.  Full range of motion in all extremities, Long bones stable.  No signs of head trauma.  Emaciated appearance   ED Results / Procedures / Treatments   Labs (all labs ordered are listed, but only abnormal results are displayed) Labs Reviewed  CBC - Abnormal; Notable for the  following components:      Result Value   WBC 11.0 (*)    RBC 3.56 (*)    Hemoglobin 12.0 (*)    HCT 35.9 (*)    MCV 100.8 (*)    All other components within normal limits  BASIC METABOLIC PANEL - Abnormal; Notable for the following components:   Sodium 131 (*)    Potassium 3.3 (*)    CO2 21 (*)    Creatinine, Ser 1.38 (*)    Calcium 7.7 (*)    GFR, Estimated 53 (*)    All other components within normal limits  URINALYSIS, ROUTINE W REFLEX MICROSCOPIC  CBG MONITORING, ED     EKG Interpreted by me Sinus rhythm rate of 90.  Normal axis, left bundle branch block.  No acute ischemic changes.   RADIOLOGY    PROCEDURES:  Procedures   MEDICATIONS ORDERED IN ED: Medications  sodium chloride 0.9 % bolus 500 mL (0 mLs Intravenous Stopped 12/31/21 1256)  lactated ringers bolus 1,000 mL (1,000 mLs Intravenous New Bag/Given 12/31/21 1255)  IMPRESSION / MDM / ASSESSMENT AND PLAN / ED COURSE  I reviewed the triage vital signs and the nursing notes.                              Differential diagnosis includes, but is not limited to, dehydration, AKI, electrolyte abnormality, anemia  Patient's presentation is most consistent with acute presentation with potential threat to life or bodily function.  Patient presents with generalized weakness, which by history is due to poor oral intake.  Found to be hypotensive by ED mass which was resolved with IV fluid bolus given prior to arrival in the emergency department.  Will give additional fluids while checking labs.  No signs or symptoms of DVT or PE.       FINAL CLINICAL IMPRESSION(S) / ED DIAGNOSES   Final diagnoses:  Dehydration     Rx / DC Orders   ED Discharge Orders     None        Note:  This document was prepared using Dragon voice recognition software and may include unintentional dictation errors.     Sharman Cheek, MD 12/31/21 1320

## 2021-12-31 NOTE — Discharge Instructions (Addendum)
Your lab tests today were all okay and do not show any acute issues.  Be sure to eat regularly and drink plenty of fluids to maintain good hydration and energy level.

## 2021-12-31 NOTE — ED Triage Notes (Signed)
Pt to ED via [facility] from home. Pt reports he has been feeling malaise, increased leg pain and weakness x3 weeks. Pt hypotensive with EMS. BP on arrival 86/53.Pt given 400cc NS in route.

## 2022-01-09 ENCOUNTER — Other Ambulatory Visit: Payer: Medicare Other

## 2022-01-14 ENCOUNTER — Ambulatory Visit: Payer: Medicare Other | Admitting: Family Medicine

## 2022-01-28 ENCOUNTER — Ambulatory Visit: Payer: Medicare Other | Admitting: Family Medicine

## 2022-01-29 NOTE — Progress Notes (Deleted)
Cardiology Office Note    Date:  01/29/2022   ID:  Andren, Bethea 1946-03-20, MRN [id number]  PCP:  [name], MD  Cardiologist:  Julien Nordmann, MD  Electrophysiologist:  None   Chief Complaint: Follow-up  History of Present Illness:   Devin Ramsey is a 76 y.o. male with history of permanent Afib/flutter with failed DCCV in 08/2014 on Eliquis, HFrEF felt to be secondary to presumed NICM, LBBB, moderate aortic atherosclerosis, COPD secondary to prior tobacco abuse quitting in 2016, hypothyroidism, anemia, HTN, HLD, and gout who presents for follow-up of his A. fib and nonischemic cardiomyopathy.    Based on prior EKGs, it appears his Afib dates back to at least 06/2014. Echo from 06/2014 showed an EF of 35-40%, WMA consistent with bundle branch block, normal sized LA. Follow up echo in 03/2015 showed no improvement in his EF in sinus rhythm with an EF of 35-40%, mild concentric LVH, no RWMA, normal LV diastolic function parameters, mild to moderate AI, moderate MR, mildly to moderately dilated left atrium measuring 42 mm. Prior CT in 01/2015 showed extensive coronary artery calcification, aorta, as well as the orgin of the SMA and renal arteries. Echo on 05/29/2018 showed a low, though stable EF of 35-40%, mild concentric LVH, mild AI, moderate MR, moderately dilated LA. Follow up Myoview on 06/03/2018 showed no significant ischemia with a small region of fixed defect in the apical and apical anteroseptal region consistent with prior MI, EF 53%, low risk scan. He underwent lower extremity arterial studies on 05/29/2018 that showed a moderately reduced ABI at 0.5 on the right and 0.48 on the left. Duplex showed bilateral SFA occlusion with tibial disease. He was seen that day by Dr. Kirke Corin in vascular consult and reported a long history of an ingrown toenail that was affecting his second left toe. He was felt to have minimal claudication likely due to poor functional capacity. There was no  ulceration noted of his lower extremities.  Echo in 07/2019 showed an EF of 35 to 40%, global hypokinesis, indeterminate LV diastolic function parameters, mildly reduced RV systolic function with mildly enlarged ventricular cavity size, PASP 39.2 mmHg, severely dilated left atrium, moderate mitral regurgitation, and mild to moderate aortic insufficiency.  Given his persistent cardiomyopathy, he was evaluated by EP with no recommendation for CRT in the context of the patient's cardiomyopathy and underlying left bundle branch block.  Most recent echo from 07/2020 showed an EF of 30 to 35%, global hypokinesis, mildly dilated LV internal cavity size, indeterminate LV diastolic function parameters, normal RV systolic function, ventricular cavity size, and PASP, mildly dilated left atrium, and mild to moderate aortic insufficiency.  Given further reduction in his EF, he was again evaluated by EP in 09/2020 with the patient declining further invasive intervention and his cardiomyopathy felt to be aggravated by alcohol intake.  He was last seen in the office in 12/2020 and was without symptoms of angina or decompensation.  His weight was trending down.  It was noted beta-blocker had previously been held due to bradycardia.  No changes were made.  ***   Labs independently reviewed: 12/2021 - potassium 3.3, BUN 16, serum creatinine 1.38, Hgb 12.0, PLT 167 09/2021 - TC 109, TG 49, HDL 55, LDL 43, TSH 9.84, albumin 3.8, AST/ALT normal  Past Medical History:  Diagnosis Date   Anemia    B12 deficiency 10/28/2019   Bilateral pleural effusion    Chronic systolic CHF (congestive heart failure) (HCC)  a. TTE 2/16: EF of 35-40%, WMA consistent with BBB, normal sized LA; b. TTE 11/16: F of 35-40%, mild concentric LVH, no RWMA, normal LV diastolic function parameters, mild to moderate AI, moderate MR, mildly to moderately dilated left atrium measuring 42 mm   COPD (chronic obstructive pulmonary disease) (HCC)    Essential  hypertension    Gout    HLD (hyperlipidemia)    Hypothyroidism    LBBB (left bundle branch block)    Permanent atrial fibrillation (HCC)    a. CHADS2VSC => 4 (CHF, HTN, age x 1, vascular disease); b. on Eliquis   Pneumonia 2016   Tobacco abuse     Past Surgical History:  Procedure Laterality Date   HEMORRHOID SURGERY     SHOULDER SURGERY     right shoulder    TONSILLECTOMY      Current Medications: No outpatient medications have been marked as taking for the 02/01/22 encounter (Appointment) with Sondra Barges, PA-C.    Allergies:   Codeine and Indocin [indomethacin]   Social History   Socioeconomic History   Marital status: Married    Spouse name: Not on file   Number of children: Not on file   Years of education: Not on file   Highest education level: Not on file  Occupational History   Not on file  Tobacco Use   Smoking status: Some Days    Packs/day: 0.25    Years: 56.00    Total pack years: 14.00    Types: Cigarettes    Last attempt to quit: 06/27/2014    Years since quitting: 7.5   Smokeless tobacco: Never  Vaping Use   Vaping Use: Never used  Substance and Sexual Activity   Alcohol use: Yes    Alcohol/week: 15.0 standard drinks of alcohol    Types: 15 Cans of beer per week    Comment: 2-3 beers a day average   Drug use: No   Sexual activity: Not Currently  Other Topics Concern   Not on file  Social History Narrative   Married 1966   From [place] fan   1 son, local   Social Determinants of Health   Financial Resource Strain: Low Risk  (08/27/2021)   Overall Financial Resource Strain (CARDIA)    Difficulty of Paying Living Expenses: Not hard at all  Food Insecurity: No Food Insecurity (08/27/2021)   Hunger Vital Sign    Worried About Running Out of Food in the Last Year: Never true    Ran Out of Food in the Last Year: Never true  Transportation Needs: No Transportation Needs (08/27/2021)   PRAPARE - Administrator, Civil Service  (Medical): No    Lack of Transportation (Non-Medical): No  Physical Activity: Insufficiently Active (08/27/2021)   Exercise Vital Sign    Days of Exercise per Week: 2 days    Minutes of Exercise per Session: 10 min  Stress: No Stress Concern Present (08/27/2021)   [employer] of Occupational Health - Occupational Stress Questionnaire    Feeling of Stress : Not at all  Social Connections: Socially Integrated (08/27/2021)   Social Connection and Isolation Panel [NHANES]    Frequency of Communication with Friends and Family: More than three times a week    Frequency of Social Gatherings with Friends and Family: More than three times a week    Attends Religious Services: More than 4 times per year    Active Member of [employer] or Organizations:  Yes    Attends Banker Meetings: More than 4 times per year    Marital Status: Married     Family History:  The patient's family history is negative for Colon cancer and Prostate cancer. He was adopted.  ROS:   ROS   EKGs/Labs/Other Studies Reviewed:    Studies reviewed were summarized above. The additional studies were reviewed today:  2D echo 03/23/2015: - Procedure narrative: Transthoracic echocardiography. Image    quality was suboptimal.  - Left ventricle: The cavity size was mildly dilated. There was    mild concentric hypertrophy. Systolic function was moderately    reduced. The estimated ejection fraction was in the range of 35%    to 40%. Wall motion was normal; there were no regional wall    motion abnormalities. Left ventricular diastolic function    parameters were normal.  - Aortic valve: There was mild to moderate regurgitation.  - Mitral valve: There was moderate regurgitation.  - Left atrium: The atrium was mildly to moderately dilated. __________  2D echo 05/29/2018: - Left ventricle: The cavity size was normal. There was mild   concentric hypertrophy. Systolic function was moderately reduced.   The  estimated ejection fraction was in the range of 35% to 40%.   The study is not technically sufficient to allow evaluation of LV   diastolic function. - Aortic valve: There was mild regurgitation. - Mitral valve: There was moderate regurgitation. - Left atrium: The atrium was moderately dilated. - Pulmonary arteries: Systolic pressure could not be accurately estimated. __________  Eugenie Birks MPI 06/03/2018: Pharmacological myocardial perfusion imaging study with no significant  Ischemia Small region fixed defect apical and apical anteroseptal region consistent with prior MI  Normal wall motion, EF estimated at 53% No EKG changes concerning for ischemia at peak stress or in recovery. Low risk scan __________  2D echo 08/13/2019: 1. Left ventricular ejection fraction, by estimation, is 35 to 40%. The  left ventricle has moderately decreased function. The left ventricle  demonstrates global hypokinesis. Left ventricular diastolic parameters are  indeterminate.   2. Right ventricular systolic function is mildly reduced. The right  ventricular size is mildly enlarged. There is mildly elevated pulmonary  artery systolic pressure. The estimated right ventricular systolic  pressure is 39.2 mmHg.   3. Left atrial size was severely dilated.   4. Moderate mitral valve regurgitation.   5. Aortic valve regurgitation is mild to moderate.   Comparison(s): EF 35-40%.    EKG:  EKG is ordered today.  The EKG ordered today demonstrates ***  Recent Labs: 10/04/2021: ALT 8; TSH 9.84 12/31/2021: BUN 16; Creatinine, Ser 1.38; Hemoglobin 12.0; Platelets 167; Potassium 3.3; Sodium 131  Recent Lipid Panel    Component Value Date/Time   CHOL 109 10/04/2021 1014   CHOL 102 07/16/2019 1109   TRIG 49.0 10/04/2021 1014   HDL 55.60 10/04/2021 1014   HDL 60 07/16/2019 1109   CHOLHDL 2 10/04/2021 1014   VLDL 9.8 10/04/2021 1014   LDLCALC 43 10/04/2021 1014   LDLCALC 30 07/16/2019 1109   LDLDIRECT 38  07/16/2019 1109    PHYSICAL EXAM:    VS:  There were no vitals taken for this visit.  BMI: There is no height or weight on file to calculate BMI.  Physical Exam  Wt Readings from Last 3 Encounters:  12/31/21 130 lb (59 kg)  08/27/21 155 lb (70.3 kg)  01/15/21 140 lb 6 oz (63.7 kg)     ASSESSMENT & PLAN:  HFrEF secondary to presumed NICM with LBBB:  Permanent A-fib: ***.  CHA2DS2-VASc at least 5 (CHF, HTN, age x2, vascular disease).  HTN: Blood pressure  Aortic atherosclerosis/HLD: LDL 43 in 09/2021.  Macrocytic anemia:  Alcohol use:  Abnormal TSH:   {Are you ordering a CV Procedure (e.g. stress test, cath, DCCV, TEE, etc)?   Press F2        :[id number]}     Disposition: F/u with Dr. [name] or an APP in ***.   Medication Adjustments/Labs and Tests Ordered: Current medicines are reviewed at length with the patient today.  Concerns regarding medicines are outlined above. Medication changes, Labs and Tests ordered today are summarized above and listed in the Patient Instructions accessible in Encounters.   Signed, Eula Listen, PA-C 01/29/2022 12:14 PM     [facility] [address] [place], [place] [address] [phone]

## 2022-02-01 ENCOUNTER — Ambulatory Visit: Payer: Medicare Other | Attending: Physician Assistant | Admitting: Physician Assistant

## 2022-02-21 ENCOUNTER — Inpatient Hospital Stay (HOSPITAL_COMMUNITY)
Admission: EM | Admit: 2022-02-21 | Discharge: 2022-03-20 | DRG: 871 | Disposition: E | Payer: Medicare Other | Attending: Internal Medicine | Admitting: Internal Medicine

## 2022-02-21 ENCOUNTER — Emergency Department (HOSPITAL_COMMUNITY): Payer: Medicare Other

## 2022-02-21 ENCOUNTER — Encounter (HOSPITAL_COMMUNITY): Payer: Self-pay

## 2022-02-21 DIAGNOSIS — F0390 Unspecified dementia without behavioral disturbance: Secondary | ICD-10-CM | POA: Diagnosis present

## 2022-02-21 DIAGNOSIS — J440 Chronic obstructive pulmonary disease with acute lower respiratory infection: Secondary | ICD-10-CM | POA: Diagnosis present

## 2022-02-21 DIAGNOSIS — G9341 Metabolic encephalopathy: Secondary | ICD-10-CM | POA: Diagnosis present

## 2022-02-21 DIAGNOSIS — N179 Acute kidney failure, unspecified: Secondary | ICD-10-CM | POA: Diagnosis not present

## 2022-02-21 DIAGNOSIS — Z7952 Long term (current) use of systemic steroids: Secondary | ICD-10-CM

## 2022-02-21 DIAGNOSIS — I5022 Chronic systolic (congestive) heart failure: Secondary | ICD-10-CM | POA: Diagnosis present

## 2022-02-21 DIAGNOSIS — E161 Other hypoglycemia: Secondary | ICD-10-CM | POA: Diagnosis not present

## 2022-02-21 DIAGNOSIS — I469 Cardiac arrest, cause unspecified: Secondary | ICD-10-CM

## 2022-02-21 DIAGNOSIS — Z20822 Contact with and (suspected) exposure to covid-19: Secondary | ICD-10-CM | POA: Diagnosis not present

## 2022-02-21 DIAGNOSIS — R404 Transient alteration of awareness: Secondary | ICD-10-CM | POA: Diagnosis not present

## 2022-02-21 DIAGNOSIS — R64 Cachexia: Secondary | ICD-10-CM | POA: Diagnosis present

## 2022-02-21 DIAGNOSIS — J189 Pneumonia, unspecified organism: Secondary | ICD-10-CM | POA: Diagnosis present

## 2022-02-21 DIAGNOSIS — R6521 Severe sepsis with septic shock: Secondary | ICD-10-CM | POA: Diagnosis not present

## 2022-02-21 DIAGNOSIS — I4821 Permanent atrial fibrillation: Secondary | ICD-10-CM | POA: Diagnosis not present

## 2022-02-21 DIAGNOSIS — Z7951 Long term (current) use of inhaled steroids: Secondary | ICD-10-CM

## 2022-02-21 DIAGNOSIS — E785 Hyperlipidemia, unspecified: Secondary | ICD-10-CM | POA: Diagnosis present

## 2022-02-21 DIAGNOSIS — I639 Cerebral infarction, unspecified: Secondary | ICD-10-CM | POA: Insufficient documentation

## 2022-02-21 DIAGNOSIS — J9690 Respiratory failure, unspecified, unspecified whether with hypoxia or hypercapnia: Secondary | ICD-10-CM | POA: Diagnosis not present

## 2022-02-21 DIAGNOSIS — Z79890 Hormone replacement therapy (postmenopausal): Secondary | ICD-10-CM | POA: Diagnosis not present

## 2022-02-21 DIAGNOSIS — I63532 Cerebral infarction due to unspecified occlusion or stenosis of left posterior cerebral artery: Secondary | ICD-10-CM | POA: Diagnosis present

## 2022-02-21 DIAGNOSIS — G9340 Encephalopathy, unspecified: Secondary | ICD-10-CM

## 2022-02-21 DIAGNOSIS — E538 Deficiency of other specified B group vitamins: Secondary | ICD-10-CM | POA: Diagnosis not present

## 2022-02-21 DIAGNOSIS — A419 Sepsis, unspecified organism: Principal | ICD-10-CM | POA: Diagnosis present

## 2022-02-21 DIAGNOSIS — R6889 Other general symptoms and signs: Secondary | ICD-10-CM | POA: Diagnosis not present

## 2022-02-21 DIAGNOSIS — I110 Hypertensive heart disease with heart failure: Secondary | ICD-10-CM | POA: Diagnosis not present

## 2022-02-21 DIAGNOSIS — R571 Hypovolemic shock: Secondary | ICD-10-CM | POA: Diagnosis present

## 2022-02-21 DIAGNOSIS — E872 Acidosis, unspecified: Secondary | ICD-10-CM | POA: Diagnosis not present

## 2022-02-21 DIAGNOSIS — E039 Hypothyroidism, unspecified: Secondary | ICD-10-CM | POA: Diagnosis not present

## 2022-02-21 DIAGNOSIS — M109 Gout, unspecified: Secondary | ICD-10-CM | POA: Diagnosis not present

## 2022-02-21 DIAGNOSIS — E860 Dehydration: Secondary | ICD-10-CM | POA: Diagnosis not present

## 2022-02-21 DIAGNOSIS — Z743 Need for continuous supervision: Secondary | ICD-10-CM | POA: Diagnosis not present

## 2022-02-21 DIAGNOSIS — F17210 Nicotine dependence, cigarettes, uncomplicated: Secondary | ICD-10-CM | POA: Diagnosis present

## 2022-02-21 DIAGNOSIS — Z7901 Long term (current) use of anticoagulants: Secondary | ICD-10-CM | POA: Diagnosis not present

## 2022-02-21 LAB — CBC WITH DIFFERENTIAL/PLATELET
Abs Immature Granulocytes: 0.06 10*3/uL (ref 0.00–0.07)
Basophils Absolute: 0 10*3/uL (ref 0.0–0.1)
Basophils Relative: 0 %
Eosinophils Absolute: 0 10*3/uL (ref 0.0–0.5)
Eosinophils Relative: 0 %
HCT: 17.4 % — ABNORMAL LOW (ref 39.0–52.0)
Hemoglobin: 5.1 g/dL — CL (ref 13.0–17.0)
Immature Granulocytes: 1 %
Lymphocytes Relative: 19 %
Lymphs Abs: 1.7 10*3/uL (ref 0.7–4.0)
MCH: 31.5 pg (ref 26.0–34.0)
MCHC: 29.3 g/dL — ABNORMAL LOW (ref 30.0–36.0)
MCV: 107.4 fL — ABNORMAL HIGH (ref 80.0–100.0)
Monocytes Absolute: 0.2 10*3/uL (ref 0.1–1.0)
Monocytes Relative: 3 %
Neutro Abs: 6.6 10*3/uL (ref 1.7–7.7)
Neutrophils Relative %: 77 %
Platelets: <5 10*3/uL — CL (ref 150–400)
RBC: 1.62 MIL/uL — ABNORMAL LOW (ref 4.22–5.81)
RDW: 16.2 % — ABNORMAL HIGH (ref 11.5–15.5)
WBC: 8.5 10*3/uL (ref 4.0–10.5)
nRBC: 2 % — ABNORMAL HIGH (ref 0.0–0.2)

## 2022-02-21 LAB — COMPREHENSIVE METABOLIC PANEL
ALT: 13 U/L (ref 0–44)
AST: 35 U/L (ref 15–41)
Albumin: 1.8 g/dL — ABNORMAL LOW (ref 3.5–5.0)
Alkaline Phosphatase: 51 U/L (ref 38–126)
Anion gap: 15 (ref 5–15)
BUN: 47 mg/dL — ABNORMAL HIGH (ref 8–23)
CO2: 11 mmol/L — ABNORMAL LOW (ref 22–32)
Calcium: 6.9 mg/dL — ABNORMAL LOW (ref 8.9–10.3)
Chloride: 111 mmol/L (ref 98–111)
Creatinine, Ser: 3.01 mg/dL — ABNORMAL HIGH (ref 0.61–1.24)
GFR, Estimated: 21 mL/min — ABNORMAL LOW (ref >60)
Glucose, Bld: 70 mg/dL (ref 70–99)
Potassium: 4.4 mmol/L (ref 3.5–5.1)
Sodium: 137 mmol/L (ref 135–145)
Total Bilirubin: 1.9 mg/dL — ABNORMAL HIGH (ref 0.3–1.2)
Total Protein: 3.7 g/dL — ABNORMAL LOW (ref 6.5–8.1)

## 2022-02-21 LAB — PROTIME-INR
INR: 1.9 — ABNORMAL HIGH (ref 0.8–1.2)
Prothrombin Time: 21.2 seconds — ABNORMAL HIGH (ref 11.4–15.2)

## 2022-02-21 LAB — APTT: aPTT: 50 seconds — ABNORMAL HIGH (ref 24–36)

## 2022-02-21 LAB — ACETAMINOPHEN LEVEL: Acetaminophen (Tylenol), Serum: <10 ug/mL — ABNORMAL LOW (ref 10–30)

## 2022-02-21 LAB — URINALYSIS, ROUTINE W REFLEX MICROSCOPIC
Glucose, UA: NEGATIVE mg/dL
Ketones, ur: NEGATIVE mg/dL
Leukocytes,Ua: NEGATIVE
Nitrite: NEGATIVE
Protein, ur: 30 mg/dL — AB
Specific Gravity, Urine: 1.015 (ref 1.005–1.030)
pH: 5 (ref 5.0–8.0)

## 2022-02-21 LAB — TSH: TSH: 5.253 u[IU]/mL — ABNORMAL HIGH (ref 0.350–4.500)

## 2022-02-21 LAB — CORTISOL: Cortisol, Plasma: 24.7 ug/dL

## 2022-02-21 LAB — RESP PANEL BY RT-PCR (FLU A&B, COVID) ARPGX2
Influenza A by PCR: NEGATIVE
Influenza B by PCR: NEGATIVE
SARS Coronavirus 2 by RT PCR: NEGATIVE

## 2022-02-21 LAB — SALICYLATE LEVEL: Salicylate Lvl: <7 mg/dL — ABNORMAL LOW (ref 7.0–30.0)

## 2022-02-21 LAB — MAGNESIUM: Magnesium: 1.9 mg/dL (ref 1.7–2.4)

## 2022-02-21 LAB — LACTIC ACID, PLASMA
Lactic Acid, Venous: >9 mmol/L (ref 0.5–1.9)
Lactic Acid, Venous: >9 mmol/L (ref 0.5–1.9)

## 2022-02-21 LAB — CK: Total CK: 782 U/L — ABNORMAL HIGH (ref 49–397)

## 2022-02-21 LAB — PHOSPHORUS: Phosphorus: 4.7 mg/dL — ABNORMAL HIGH (ref 2.5–4.6)

## 2022-02-21 LAB — TROPONIN I (HIGH SENSITIVITY): Troponin I (High Sensitivity): 91 ng/L — ABNORMAL HIGH (ref <18)

## 2022-02-21 LAB — ETHANOL: Alcohol, Ethyl (B): <10 mg/dL (ref <10)

## 2022-02-21 LAB — PROCALCITONIN: Procalcitonin: 0.72 ng/mL

## 2022-02-21 MED ORDER — VANCOMYCIN VARIABLE DOSE PER UNSTABLE RENAL FUNCTION (PHARMACIST DOSING): Status: DC

## 2022-02-21 MED ORDER — CALCIUM CHLORIDE 10 % IV SOLN
INTRAVENOUS | Status: AC | PRN
  Administered 2022-02-21: 1 g via INTRAVENOUS

## 2022-02-21 MED ORDER — NOREPINEPHRINE 4 MG/250ML-% IV SOLN: 2.0000 ug/min | INTRAVENOUS | Status: DC

## 2022-02-21 MED ORDER — LACTATED RINGERS IV BOLUS: 1000.0000 mL | Freq: Once | INTRAVENOUS | Status: DC

## 2022-02-21 MED ORDER — SODIUM CHLORIDE 0.9 % IV SOLN: 2.0000 g | INTRAVENOUS | Status: DC

## 2022-02-21 MED ORDER — SODIUM CHLORIDE 0.9 % IV SOLN: 500.0000 mg | INTRAVENOUS | Status: DC

## 2022-02-21 MED ORDER — VANCOMYCIN HCL 1500 MG/300ML IV SOLN
1500.0000 mg | Freq: Once | INTRAVENOUS | Status: AC
  Administered 2022-02-21: 1500 mg via INTRAVENOUS
  Filled 2022-02-21: qty 300

## 2022-02-21 MED ORDER — EPINEPHRINE 1 MG/10ML IJ SOSY
PREFILLED_SYRINGE | INTRAMUSCULAR | Status: AC | PRN
  Administered 2022-02-21 (×3): 1 mg via INTRAVENOUS

## 2022-02-21 MED ORDER — HEPARIN (PORCINE) 25000 UT/250ML-% IV SOLN: 950.0000 [IU]/h | INTRAVENOUS | Status: DC

## 2022-02-21 MED ORDER — REVEFENACIN 175 MCG/3ML IN SOLN
175.0000 ug | Freq: Every day | RESPIRATORY_TRACT | Status: DC
  Filled 2022-02-21 (×2): qty 3

## 2022-02-21 MED ORDER — SODIUM CHLORIDE 0.9 % IV SOLN
2.0000 g | Freq: Once | INTRAVENOUS | Status: AC
  Administered 2022-02-21: 2 g via INTRAVENOUS
  Filled 2022-02-21: qty 12.5

## 2022-02-21 MED ORDER — DOCUSATE SODIUM 100 MG PO CAPS: 100.0000 mg | ORAL_CAPSULE | Freq: Two times a day (BID) | ORAL | Status: DC | PRN

## 2022-02-21 MED ORDER — POLYETHYLENE GLYCOL 3350 17 G PO PACK: 17.0000 g | PACK | Freq: Every day | ORAL | Status: DC | PRN

## 2022-02-21 MED ORDER — LACTATED RINGERS IV BOLUS (SEPSIS)
1000.0000 mL | Freq: Once | INTRAVENOUS | Status: AC
Start: 2022-02-21 — End: 2022-02-21
  Administered 2022-02-21: 1000 mL via INTRAVENOUS

## 2022-02-21 MED ORDER — NOREPINEPHRINE 4 MG/250ML-% IV SOLN
0.0000 ug/min | INTRAVENOUS | Status: DC
  Administered 2022-02-21: 2 ug/min via INTRAVENOUS
  Filled 2022-02-21: qty 250

## 2022-02-21 MED ORDER — ALBUTEROL SULFATE (2.5 MG/3ML) 0.083% IN NEBU: 2.5000 mg | INHALATION_SOLUTION | RESPIRATORY_TRACT | Status: DC | PRN

## 2022-02-21 MED ORDER — SODIUM BICARBONATE 8.4 % IV SOLN
INTRAVENOUS | Status: AC | PRN
  Administered 2022-02-21 (×3): 100 meq via INTRAVENOUS

## 2022-02-21 MED ORDER — SODIUM CHLORIDE 0.9 % IV SOLN: 250.0000 mL | INTRAVENOUS | Status: DC

## 2022-02-21 MED ORDER — SODIUM CHLORIDE 0.9 % IV BOLUS
2000.0000 mL | Freq: Once | INTRAVENOUS | Status: AC
  Administered 2022-02-21: 2000 mL via INTRAVENOUS

## 2022-02-21 MED ORDER — EPINEPHRINE 1 MG/10ML IJ SOSY
PREFILLED_SYRINGE | INTRAMUSCULAR | Status: AC | PRN
  Administered 2022-02-21: 1 mg via INTRAVENOUS

## 2022-02-21 MED ORDER — LACTATED RINGERS IV SOLN: INTRAVENOUS | Status: DC

## 2022-02-22 ENCOUNTER — Other Ambulatory Visit: Payer: Self-pay | Admitting: Family Medicine

## 2022-02-23 LAB — URINE CULTURE: Culture: NO GROWTH

## 2022-02-26 LAB — CULTURE, BLOOD (ROUTINE X 2)
Culture: NO GROWTH
Culture: NO GROWTH

## 2022-03-20 NOTE — Code Documentation (Signed)
Pt was placed on pacer at 80 milliamps with verified capture at 1458 d/t him becoming brady very rapidly.

## 2022-03-20 NOTE — ED Provider Notes (Signed)
While patient was awaiting transfer to the ICU to his hospital bed, he lost pulses.  I was at the bedside and started CPR with staff.  Patient got multiple rounds of epinephrine.  Appeared to be in a PEA rhythm then to asystole and then to PEA.  I intubated him with glide scope.  ICU team joined me took over the care of the patient.  But ultimately were unable to get return of spontaneous circulation.  Overall suspect metabolic arrest.  ICU team will continue to try to notify family as we have been unable to get in touch with them.  Please see ICU note for further code process.  Procedure Name: Intubation Date/Time: 2022/03/02 3:07 PM  Performed by: Lennice Sites, DOPre-anesthesia Checklist: Patient identified Oxygen Delivery Method: Ambu bag Preoxygenation: Pre-oxygenation with 100% oxygen Ventilation: Mask ventilation without difficulty Laryngoscope Size: Glidescope and 4 Grade View: Grade I Number of attempts: 1 Airway Equipment and Method: Video-laryngoscopy and Rigid stylet Placement Confirmation: ETT inserted through vocal cords under direct vision and Positive ETCO2 Secured at: 23 cm Tube secured with: ETT holder Dental Injury: Teeth and Oropharynx as per pre-operative assessment  Difficulty Due To: Difficulty was unanticipated        Lennice Sites, DO 03/02/2022 1508

## 2022-03-20 NOTE — Progress Notes (Signed)
I was called by respiratory therapist that patient went into PEA cardiac arrest and CPR is in progress.  I immediately arrived to bedside, CPR was in progress, patient had received 2 mg of IV epinephrine and 2 A of bicarbonate with 1 g of calcium chloride. Patient was intubated by EDP Bedside ultrasonography of heart was performed showed dilated LV and LA, normal size RV, without movement  CPR was continued for 12 minutes, without ROSC, patient received 4 epinephrine, 4 A of bicarbonate and 2 g of calcium chloride.  After 12 minutes when ROSC was not 15, patient was declared dead.  Unable to reach to patient's wife and patient's son.    Jacky Kindle, MD [facility] See Amion for pager If no response to pager, please call [phone] until 7pm After 7pm, Please call E-link [phone]

## 2022-03-20 NOTE — Death Summary Note (Signed)
DEATH SUMMARY   Patient Details  Name: Devin Ramsey MRN: [id number] DOB: 08-29-1945  Admission/Discharge Information   Admit Date:  02-28-22  Date of Death: Date of Death: 28-Feb-2022  Time of Death: Time of Death: 09/02/00  Length of Stay: 0  Referring Physician: Tonia Ghent, MD   Reason(s) for Hospitalization  Shock, multifactorial hypovolemic and septic Bilateral multifocal pneumonia Acute occipital stroke Acute metabolic encephalopathy High anion gap metabolic acidosis Acute kidney injury Chronic A-fib Chronic HFrEF COPD  Diagnoses  Preliminary cause of death:   PEA cardiac arrest in the setting of severe metabolic acidosis and septic shock Secondary Diagnoses (including complications and co-morbidities):  Principal Problem:   Septic shock [facility])   Brief Hospital Course (including significant findings, care, treatment, and services provided and events leading to death)  Devin Ramsey is a 76 y.o. year old male  with pertinent PMH of COPD, chronic systolic CHF, HTN, HLD, hypothyroidism, A-fib on Eliquis presents to [facility] ED on 7/5 with encephalopathy and shock.   At baseline patient is Aox4 but has progressive dementia per wife.  Family states patient over the past couple days has not been acting like himself.  On 03/01/23 patient more altered and EMS called due to decreased mental status.  Upon EMS arrival, patient found to be hypotensive and cold.  Given 2L IV fluids.  Patient altered and mumbling words.Unable to obtain pulse ox.  Patient bagged in route to [facility] ED.   Upon arrival to [facility] ED patient SBP around 60s.  Given 3L more IV fluids.  Patient required to be started on low dose levo.  Patient remained altered and not following commands.  HR 100-110s A-fib.  Temp 92 F.  Bair hugger placed.  Culture sent and started on cefepime and vanc.  CXR showing possible infiltrate and left lower lobe. ABG showing pH 7.22, pao2 136, paco2 17. LA >9. CT head pending.  PCCM consulted for  ICU admission.   Pertinent ED labs: CO2 11, BUN 47, creat 3.01, albumin 1.8  Patient was started on IV vasopressors, he received multiple IV fluid boluses, he received 2 A of bicarbonate but unfortunately he went into bradycardia and then PEA cardiac arrest, CPR was initiated per ACLS protocol, he was intubated at bedside, he received multiple doses of epinephrine, sodium bicarbonate and calcium chloride.  After 12 minutes of CPR ROSC was not achieved and patient was declared dead at 3:02 PM on 02/28/22.  Pertinent Labs and Studies  Significant Diagnostic Studies CT Head Wo Contrast  Addendum Date: 02/28/2022   ADDENDUM REPORT: 28-Feb-2022 14:11 ADDENDUM: These results were called by telephone at the time of interpretation on 2022-02-28 at 1:54 pm to provider ADAM CURATOLO , who verbally acknowledged these results. Electronically Signed   By: Kellie Simmering D.O.   On: 02-28-22 14:11   Result Date: 02/28/22 CLINICAL DATA:  Provided history: Mental status change, unknown cause. EXAM: CT HEAD WITHOUT CONTRAST TECHNIQUE: Contiguous axial images were obtained from the base of the skull through the vertex without intravenous contrast. RADIATION DOSE REDUCTION: This exam was performed according to the departmental dose-optimization program which includes automated exposure control, adjustment of the mA and/or kV according to patient size and/or use of iterative reconstruction technique. COMPARISON:  No pertinent prior exams available for comparison. FINDINGS: Mildly motion degraded exam. Brain: Moderate generalized cerebral atrophy. Mild cerebellar atrophy. 3.9 cm cortical/subcortical infarct within the right occipital lobe (right PCA vascular territory). Small cortically based infarct within the right parietal  lobe. 4.1 cm cortical/subcortical infarct within the left parietooccipital lobes (left PCA vascular territory). Motion degradation limits evaluation, but these infarcts are favored to be acute/subacute.  No significant associated mass effect at this time. Chronic lacunar infarcts within the left corona radiata and bilateral basal ganglia. Background mild patchy and ill-defined hypoattenuation within the cerebral white matter, nonspecific but compatible with chronic small vessel disease. A small age-indeterminate infarct is questioned within the left cerebellar hemisphere (series 5, image 7). There is no acute intracranial hemorrhage. No extra-axial fluid collection. No evidence of an intracranial mass. No midline shift. Vascular: No hyperdense vessel. Atherosclerotic calcifications. Skull: No fracture or aggressive osseous lesion. Sinuses/Orbits: No mass or acute finding within the imaged orbits. Mild mucosal thickening within the left sphenoid sinus. Opacification of an anterior right ethmoid air cell. Attempts are being made to reach the ordering provider at this time. IMPRESSION: 1. Motion degraded exam. 2. Bilateral cortical/subcortical PCA territory infarcts. Motion degradation limits evaluation, but these infarcts are favored to be acute/subacute. Consider a brain MRI for further evaluation. 3. A small age-indeterminate infarct is questioned within the left cerebellar hemisphere. This too could be further evaluated with MRI, as clinically warranted. 4. Background parenchymal atrophy and chronic small vessel ischemic disease, as described. 5. Mild paranasal sinus disease. Electronically Signed: By: Kellie Simmering D.O. On: 2022-02-25 13:51   DG Chest Port 1 View  Result Date: 2022-02-25 CLINICAL DATA:  Questionable sepsis. Evaluate for abnormality. Altered mental status. EXAM: PORTABLE CHEST 1 VIEW COMPARISON:  Chest two views 07/01/2014, AP chest 06/29/2014 FINDINGS: Cardiac silhouette and mediastinal contours are within normal limits. Moderate calcification within the aortic arch. Bilateral mid and lower lung interstitial thickening appears similar to remote 07/01/2014 radiographs and may represent chronic  scarring. There is improved aeration of the left costophrenic angle, which is now clear. There is some linear density superior to this, within the inferolateral left lung that is slightly smaller in higher density compared to linear densities in this region on 07/01/2014 frontal radiograph, new from 06/29/2014 frontal radiograph. No pleural effusion or pneumothorax. Moderate multilevel degenerative disc changes of the thoracic spine. IMPRESSION: 1. Bilateral mid and lower lung interstitial thickening appears similar to remote 07/01/2014 radiographs and may represent chronic scarring. 2. Lateral left lower lung linear density may represent scarring or atelectasis. It is difficult to exclude mild early pneumonia, however pneumonia is not favored. Electronically Signed   By: Yvonne Kendall M.D.   On: February 25, 2022 13:06    Microbiology Recent Results (from the past 240 hour(s))  Resp Panel by RT-PCR (Flu A&B, Covid) Anterior Nasal Swab     Status: None   Collection Time: 2022-02-25 12:35 PM   Specimen: Anterior Nasal Swab  Result Value Ref Range Status   SARS Coronavirus 2 by RT PCR NEGATIVE NEGATIVE Final    Comment: (NOTE) SARS-CoV-2 target nucleic acids are NOT DETECTED.  The SARS-CoV-2 RNA is generally detectable in upper respiratory specimens during the acute phase of infection. The lowest concentration of SARS-CoV-2 viral copies this assay can detect is 138 copies/mL. A negative result does not preclude SARS-Cov-2 infection and should not be used as the sole basis for treatment or other patient management decisions. A negative result may occur with  improper specimen collection/handling, submission of specimen other than nasopharyngeal swab, presence of viral mutation(s) within the areas targeted by this assay, and inadequate number of viral copies(<138 copies/mL). A negative result must be combined with clinical observations, patient history, and epidemiological information. The expected  result  is Negative.  Fact Sheet for Patients:  EntrepreneurPulse.com.au  Fact Sheet for Healthcare Providers:  IncredibleEmployment.be  This test is no t yet approved or cleared by the [place] FDA and  has been authorized for detection and/or diagnosis of SARS-CoV-2 by FDA under an Emergency Use Authorization (EUA). This EUA will remain  in effect (meaning this test can be used) for the duration of the COVID-19 declaration under Section 564(b)(1) of the Act, 21 U.S.C.section 360bbb-3(b)(1), unless the authorization is terminated  or revoked sooner.       Influenza A by PCR NEGATIVE NEGATIVE Final   Influenza B by PCR NEGATIVE NEGATIVE Final    Comment: (NOTE) The Xpert Xpress SARS-CoV-2/FLU/RSV plus assay is intended as an aid in the diagnosis of influenza from Nasopharyngeal swab specimens and should not be used as a sole basis for treatment. Nasal washings and aspirates are unacceptable for Xpert Xpress SARS-CoV-2/FLU/RSV testing.  Fact Sheet for Patients: EntrepreneurPulse.com.au  Fact Sheet for Healthcare Providers: IncredibleEmployment.be  This test is not yet approved or cleared by the [place] FDA and has been authorized for detection and/or diagnosis of SARS-CoV-2 by FDA under an Emergency Use Authorization (EUA). This EUA will remain in effect (meaning this test can be used) for the duration of the COVID-19 declaration under Section 564(b)(1) of the Act, 21 U.S.C. section 360bbb-3(b)(1), unless the authorization is terminated or revoked.  Performed at [facility], [place] [address] [place],  [address]     Lab Basic Metabolic Panel: Recent Labs  Lab 02-24-2022 1304  NA 137  K 4.4  CL 111  CO2 11*  GLUCOSE 70  BUN 47*  CREATININE 3.01*  CALCIUM 6.9*   Liver Function Tests: Recent Labs  Lab 02/24/2022 1304  AST 35  ALT 13  ALKPHOS 51  BILITOT 1.9*  PROT 3.7*   ALBUMIN 1.8*   No results for input(s): "LIPASE", "AMYLASE" in the last 168 hours. No results for input(s): "AMMONIA" in the last 168 hours. CBC: No results for input(s): "WBC", "NEUTROABS", "HGB", "HCT", "MCV", "PLT" in the last 168 hours. Cardiac Enzymes: No results for input(s): "CKTOTAL", "CKMB", "CKMBINDEX", "TROPONINI" in the last 168 hours. Sepsis Labs: Recent Labs  Lab 2022/02/24 1307  LATICACIDVEN >9.0*    Procedures/Operations     Calysta Craigo 24-Feb-2022, 4:07 PM

## 2022-03-20 NOTE — Progress Notes (Signed)
ANTICOAGULATION CONSULT NOTE - Initial Consult  Pharmacy Consult for heparin Indication: atrial fibrillation  Allergies  Allergen Reactions   Codeine     Sedation.  Tolerates other opiates.     Indocin [Indomethacin]     Elevated BP, would avoid given his h/o CHF    Patient Measurements:   Heparin Dosing Weight: TBW  Vital Signs: Temp: 91.8 F (33.2 C) (10/05 1400) BP: 92/54 (10/05 1400) Pulse Rate: 106 (10/05 1400)  Labs: Recent Labs    2022-03-02 1304  APTT 50*  LABPROT 21.2*  INR 1.9*  CREATININE 3.01*    CrCl cannot be calculated (Unknown ideal weight.).   Medical History: Past Medical History:  Diagnosis Date   Anemia    B12 deficiency 10/28/2019   Bilateral pleural effusion    Chronic systolic CHF (congestive heart failure) ([place])    a. TTE 2/16: EF of 35-40%, WMA consistent with BBB, normal sized LA; b. TTE 11/16: F of 35-40%, mild concentric LVH, no RWMA, normal LV diastolic function parameters, mild to moderate AI, moderate MR, mildly to moderately dilated left atrium measuring 42 mm   COPD (chronic obstructive pulmonary disease) ([facility])    Essential hypertension    Gout    HLD (hyperlipidemia)    Hypothyroidism    LBBB (left bundle branch block)    Permanent atrial fibrillation ([place])    a. CHADS2VSC => 4 (CHF, HTN, age x 1, vascular disease); b. on Eliquis   Pneumonia 2016   Tobacco abuse     Assessment: 89 YOM presenting with respiratory distress and AMS, hx of afib on Eliquis PTA however last dose unknown and no recent fills  Goal of Therapy:  Heparin level 0.3-0.5 units/ml aPTT 66-85 seconds Monitor platelets by anticoagulation protocol: Yes   Plan:  Heparin gtt at 950 units/hr, no bolus F/u 8 hour aPTT/heparin level  Bertis Ruddy, PharmD Clinical Pharmacist ED Pharmacist Phone # [phone] 03-02-22 2:44 PM

## 2022-03-20 NOTE — ED Triage Notes (Signed)
Pt BIB [facility] from home d/t AMS the past 2 days where he has been in bed & not speaking as usual. EMS reports disoriented x4, only mumbles & does not respond on command. Rt nasal trumpet in place & BVM used upon arrival. ETCO2 14, Hx of COPD, CBG 100, Rectal on scene 95.1, resp of 9. 20g PIV in Lt upper arm & 20g Rt AC, 2 Liters NS given while en route. 12 lead showed A-Fid at a rate of 117 bpm.

## 2022-03-20 NOTE — Progress Notes (Signed)
At bedside . Currently has 3 PIV sites. Attempted to place an USGPIV, and pt began coding. Code blue team at bedside and reported the pt will likely need a CL for poor access.

## 2022-03-20 NOTE — ED Notes (Addendum)
When pt was noted to be rapidly going brady he was paced with 80 milliamps with verified capture at 1448 & pulse was lost & CPR started at 1450. EDP [place], Dr. Tacy Learn, RT, this RN and many other staff came to room immediately for ongoing ACLS. TOD 1502 after 4 rounds of EPI and remaining asystole with each pulse check.

## 2022-03-20 NOTE — ED Notes (Signed)
Pt now transported to the morgue.

## 2022-03-20 NOTE — Procedures (Signed)
Cardiopulmonary Resuscitation Note  Devin Ramsey  [id number]  01-21-1946  Date:02/23/22  Time:3:21 PM   Provider Performing:Sesilia Poucher   Procedure: Cardiopulmonary Resuscitation [phone])  Indication(s) Loss of Pulse  Consent N/A  Anesthesia N/A   Time Out N/A   Sterile Technique Hand hygiene, gloves   Procedure Description Called to patient's room for CODE BLUE. Initial rhythm was PEA/Asystole. Patient received high quality chest compressions for 12 minutes with defibrillation or cardioversion when appropriate. Epinephrine was administered every 3 minutes as directed by time Therapist, nutritional. Additional pharmacologic interventions included calcium chloride and sodium bicarbonate. Additional procedural interventions include intubation and bedside echo.  Return of spontaneous circulation was not achieved.  Family unable to be notified.   Complications/Tolerance N/A   EBL N/A   Specimen(s) N/A  Estimated time to ROSC: Not achieved

## 2022-03-20 NOTE — ED Notes (Signed)
Pt became very brady & staff began pacing him at this time.

## 2022-03-20 NOTE — Progress Notes (Signed)
PATIENT NAME: [place] RECORD NUMBER: [id number] Birthday: [birth year]  Age: 76 y.o. Admit Date: 03-19-22  Provider: Dr. Ronnald Nian; Dr. Tacy Learn; JD Rollene Rotunda PA-c  Indication: PEA arrest  Technical Description:  CPR performance duration: 12 minutes Was defibrillation or cardioversion used ? no Was external pacer placed ? no Was patient intubated pre/post CPR ? During CPR Was transvenous pacer placed ? no  Medications Administered Include      Yes/no Amiodarone   Atropin   Calcium yes  Epinephrine yes  Lidocaine   Magnesium   Norepinephrine yes  Phenylephrine   Sodium bicarbonate yes  Vasopression    Evaluation Final Status - Was patient successfully resuscitated ? no If successfully resuscitated - what is current rhythm ? na If successfully resuscitated - what is current hemodynamic status ? na  Miscellaneous Information Patient lost pulses and rhythm PEA; CPR started 1450. Epi, bicarb, and calcium given. Patient successfully intubated confirmed w/ co2 detector and BS bilaterally. After 12 minutes of cpr unable to obtain pulses. No cardiac movement seen on ultrasound. TOD 1502. Will attempt to call family.  Mick Sell October 31, 20233:18 PM

## 2022-03-20 NOTE — Progress Notes (Signed)
ABG results on NRB:  pH: 7.188 PCO2: 19.9 PaO2: 165 HCO3: 7.5  Temperature corrected values at 95.0 degrees F:  pH: 7.215 PaCO2:18.2 PaO2: 154

## 2022-03-20 NOTE — Progress Notes (Signed)
Notified bedside nurse of need to draw repeat lactic acid. 

## 2022-03-20 NOTE — Progress Notes (Signed)
Elink following code sepsis °

## 2022-03-20 NOTE — ED Notes (Signed)
RT removed nasal trumpet from Rt nare.

## 2022-03-20 NOTE — ED Notes (Signed)
Flat line strip printed & placed in medical records with a pt label on it.

## 2022-03-20 NOTE — Progress Notes (Signed)
Pharmacy Antibiotic Note  Devin Ramsey is a 76 y.o. male admitted on 03-05-2022 with sepsis.  Pharmacy has been consulted for vancomycin and cefepime dosing.  Patient presented hypotensive and unresponsive found to be septic. Unable to obtain history from patient due to altered mental status. Patient received vancomycin 1500 mg LD on 10/5.   Plan: Initiate cefepime 2 g ever 24 hours. Vancomycin dosing variable due to unstable renal function.   Monitor renal function, CBC, s/sx of infection. Narrow antibiotics as appropriate.   Temp (24hrs), Avg:92.1 F (33.4 C), Min:90.1 F (32.3 C), Max:92.7 F (33.7 C)  No results for input(s): "WBC", "CREATININE", "LATICACIDVEN", "VANCOTROUGH", "VANCOPEAK", "VANCORANDOM", "GENTTROUGH", "GENTPEAK", "GENTRANDOM", "TOBRATROUGH", "TOBRAPEAK", "TOBRARND", "AMIKACINPEAK", "AMIKACINTROU", "AMIKACIN" in the last 168 hours.  CrCl cannot be calculated (Patient's most recent lab result is older than the maximum 21 days allowed.).    Allergies  Allergen Reactions   Codeine     Sedation.  Tolerates other opiates.     Indocin [Indomethacin]     Elevated BP, would avoid given his h/o CHF    Microbiology results: 10/5 BCx: collected  Thank you for allowing pharmacy to be a part of this patient's care.  Sinda Du, PharmD Candidate Mar 05, 2022 1:45 PM

## 2022-03-20 NOTE — H&P (Signed)
NAME:  Devin Ramsey, MRN:  [id number], DOB:  Jun 14, 1945, LOS: 0 ADMISSION DATE:  Mar 15, 2022, CONSULTATION DATE:  10/5 REFERRING MD:  Dr. Ronnald Nian, CHIEF COMPLAINT:  septic shock   History of Present Illness:  Patient is a 76 year old male with pertinent PMH of COPD, chronic systolic CHF, HTN, HLD, hypothyroidism, A-fib on Eliquis presents to [facility] ED on 7/5 with encephalopathy and shock.  At baseline patient is Aox4 but has progressive dementia per wife.  Family states patient over the past couple days has not been acting like himself.  On 10/5 patient more altered and EMS called due to decreased mental status.  Upon EMS arrival, patient found to be hypotensive and cold.  Given 2L IV fluids.  Patient altered and mumbling words.Unable to obtain pulse ox.  Patient bagged in route to [facility] ED.  Upon arrival to [facility] ED patient SBP around 60s.  Given 3L more IV fluids.  Patient required to be started on low dose levo.  Patient remained altered and not following commands.  HR 100-110s A-fib.  Temp 92 F.  Bair hugger placed.  Culture sent and started on cefepime and vanc.  CXR showing possible infiltrate and left lower lobe. ABG showing pH 7.22, pao2 136, paco2 17. LA >9. CT head pending.  PCCM consulted for ICU admission.  Pertinent ED labs: CO2 11, BUN 47, creat 3.01, albumin 1.8  Pertinent  Medical History   Past Medical History:  Diagnosis Date   Anemia    B12 deficiency 10/28/2019   Bilateral pleural effusion    Chronic systolic CHF (congestive heart failure) ([place])    a. TTE 2/16: EF of 35-40%, WMA consistent with BBB, normal sized LA; b. TTE 11/16: F of 35-40%, mild concentric LVH, no RWMA, normal LV diastolic function parameters, mild to moderate AI, moderate MR, mildly to moderately dilated left atrium measuring 42 mm   COPD (chronic obstructive pulmonary disease) ([facility])    Essential hypertension    Gout    HLD (hyperlipidemia)    Hypothyroidism    LBBB (left bundle branch block)     Permanent atrial fibrillation ([place])    a. CHADS2VSC => 4 (CHF, HTN, age x 1, vascular disease); b. on Eliquis   Pneumonia 2016   Tobacco abuse      Significant Hospital Events: Including procedures, antibiotic start and stop dates in addition to other pertinent events   10/5: admitted to [facility] on levo; b/l PCA stroke  Interim History / Subjective:  On 4L [place] Confused and mumbling words; not following commands On 7 mcg levo; IV fluids running  Objective   Blood pressure (!) 90/54, temperature (!) 92.7 F (33.7 C), resp. rate (!) 22.    FiO2 (%):  [100 %] 100 %  No intake or output data in the 24 hours ending 03/15/2022 1312 There were no vitals filed for this visit.  Examination: General: Cachectic ill appearing male HEENT: MM pink/moist; Arboles in place Neuro: Confused and mumbling words; not following commands; pupils 66mm sluggish to light CV: s1s2, irregular tachy 110s, no m/r/g PULM:  coarse rhonchi BS bilaterally; [place] 4L GI: soft, bsx4 active  GU: dark orange urine; appears concentrated/dehydrated Extremities: warm/dry, no edema; appears malnourished Skin: no rashes or lesions appreciated; poor skin turgor   Resolved Hospital Problem list     Assessment & Plan:  Septic shock -Possible LLL pneumonia P: -Admit to ICU with continuous telemetry -continue IV fluids -We will levo for MAP goal greater than 65 -Continue broad-spectrum  antibiotics -Check PCT -trend LA and troponin -Follow-up cultures; covid/flu pending -Continue Bair hugger for hypothermia -does have prn prednisone for gout flare; will check cortisol; consider stress dose steroids -Trend WBC/fever curve  Stroke: CT head 10/5 showing bilateral cortical/subcortical PCA territory infarct. Acute encephalopathy -baseline Aox4; progressive dementia per wife P: -Neuro following; appreciate recs -will get MRI -frequent neuro checks -limit sedating meds -BP goal per neuro; currently hypotensive on  pressors -resume home statin when able to take po -Continue neuroprotective measures- normothermia, euglycemia, HOB greater than 30, head in neutral alignment, normocapnia, normoxia.  -check tsh, ammonia -send UDS, ethanol, salicylate, acetaminophen level  COPD: takes spiriva at home; unknown O2 use Possible LLL pneumonia P: -wean o2 for sats >92% -abx as above -check expectorated sputum if able; check urine legionella/strept -covid/flu pending -yupelri -prn albuterol for wheezing  AKI Lactic acidosis Metabolic acidosis P: -iv fluids -check CK -give bicarb -Trend BMP / urinary output -Replace electrolytes as indicated -Avoid nephrotoxic agents, ensure adequate renal perfusion  A-fib on Eliquis P: -telemetry monitoring -We will start on heparin per stroke protocol with no bolus  Chronic systolic HF: Echo 6222 EF 35 to 40% HTN HLD P: -Hold home Lasix, Entresto while hypotensive -Daily weights; strict I/O's -hold statin while npo  Hx of hypothyroidism P: -check TSH -resume synthroid when able to take PO  Best Practice (right click and "Reselect all SmartList Selections" daily)   Diet/type: NPO DVT prophylaxis: systemic heparin GI prophylaxis: N/A Lines: N/A Foley:  Yes, and it is still needed Code Status:  full code Last date of multidisciplinary goals of care discussion [attempted to reach family over phone but no answer]  Labs   CBC: No results for input(s): "WBC", "NEUTROABS", "HGB", "HCT", "MCV", "PLT" in the last 168 hours.  Basic Metabolic Panel: No results for input(s): "NA", "K", "CL", "CO2", "GLUCOSE", "BUN", "CREATININE", "CALCIUM", "MG", "PHOS" in the last 168 hours. GFR: CrCl cannot be calculated (Patient's most recent lab result is older than the maximum 21 days allowed.). No results for input(s): "PROCALCITON", "WBC", "LATICACIDVEN" in the last 168 hours.  Liver Function Tests: No results for input(s): "AST", "ALT", "ALKPHOS", "BILITOT",  "PROT", "ALBUMIN" in the last 168 hours. No results for input(s): "LIPASE", "AMYLASE" in the last 168 hours. No results for input(s): "AMMONIA" in the last 168 hours.  ABG No results found for: "PHART", "PCO2ART", "PO2ART", "HCO3", "TCO2", "ACIDBASEDEF", "O2SAT"   Coagulation Profile: No results for input(s): "INR", "PROTIME" in the last 168 hours.  Cardiac Enzymes: No results for input(s): "CKTOTAL", "CKMB", "CKMBINDEX", "TROPONINI" in the last 168 hours.  HbA1C: No results found for: "HGBA1C"  CBG: No results for input(s): "GLUCAP" in the last 168 hours.  Review of Systems:   Patient is encephalopathic and/or intubated. Therefore history has been obtained from chart review.    Past Medical History:  He,  has a past medical history of Anemia, B12 deficiency (10/28/2019), Bilateral pleural effusion, Chronic systolic CHF (congestive heart failure) ([place]), COPD (chronic obstructive pulmonary disease) ([place]), Essential hypertension, Gout, HLD (hyperlipidemia), Hypothyroidism, LBBB (left bundle branch block), Permanent atrial fibrillation ([place]), Pneumonia (2016), and Tobacco abuse.   Surgical History:   Past Surgical History:  Procedure Laterality Date   HEMORRHOID SURGERY     SHOULDER SURGERY     right shoulder    TONSILLECTOMY       Social History:   reports that he has been smoking cigarettes. He has a 14.00 pack-year smoking history. He has never used smokeless tobacco.  He reports current alcohol use of about 15.0 standard drinks of alcohol per week. He reports that he does not use drugs.   Family History:  His family history is negative for Colon cancer and Prostate cancer. He was adopted.   Allergies Allergies  Allergen Reactions   Codeine     Sedation.  Tolerates other opiates.     Indocin [Indomethacin]     Elevated BP, would avoid given his h/o CHF     Home Medications  Prior to Admission medications   Medication Sig Start Date End Date Taking? Authorizing  Provider  apixaban (ELIQUIS) 5 MG TABS tablet Take 1 tablet by mouth twice daily 08/02/21   Minna Merritts, MD  clobetasol cream (TEMOVATE) [id number] % Apply 1 application. topically 2 (two) times daily as needed. 09/28/21   Tonia Ghent, MD  furosemide (LASIX) 20 MG tablet Take 1 tablet (20 mg total) by mouth daily as needed. 01/15/21   Minna Merritts, MD  hydrOXYzine (ATARAX) 10 MG tablet TAKE 1/2 TO 1 (ONE-HALF TO ONE) TABLET BY MOUTH EVERY 8 HOURS AS NEEDED FOR ITCHING 12/19/21   Tonia Ghent, MD  levothyroxine (SYNTHROID) 100 MCG tablet Take 1 tablet (100 mcg total) by mouth daily. 10/07/21   Tonia Ghent, MD  predniSONE (DELTASONE) 10 MG tablet 2 tabs once daily with food if needed for gout. 10/04/21   Tonia Ghent, MD  rosuvastatin (CRESTOR) 10 MG tablet Take 1 tablet (10 mg total) by mouth daily. 01/15/21   Minna Merritts, MD  sacubitril-valsartan (ENTRESTO) 24-26 MG Take 1 tablet by mouth 2 (two) times daily. 01/15/21   Minna Merritts, MD  tiotropium (SPIRIVA HANDIHALER) 18 MCG inhalation capsule Place 1 capsule (18 mcg total) into inhaler and inhale daily. 09/02/19   Tonia Ghent, MD  traMADol (ULTRAM) 50 MG tablet TAKE 2 TABLETS BY MOUTH EVERY 12 HOURS AS NEEDED FOR  KNEE  PAIN 09/06/20   Tonia Ghent, MD  vitamin B-12 (CYANOCOBALAMIN) 1000 MCG tablet Take 1,000 mcg by mouth daily.    [provider]     Critical care time: 45 minutes    JD Rexene Agent [facility] 2022-03-10, 1:12 PM  Please see [email or web address] for pager details.  From 7A-7P if no response, please call [phone]. After hours, please call ELink [phone].

## 2022-03-20 NOTE — Code Documentation (Signed)
US of the heart by provider at this time.

## 2022-03-20 NOTE — ED Notes (Signed)
Bair hugger applied.

## 2022-03-20 NOTE — ED Notes (Addendum)
2 liters NS hung upon arrival per EDP order for his soft pressures with SBP in the 60's.

## 2022-03-20 NOTE — ED Provider Notes (Signed)
[facility] EMERGENCY DEPARTMENT Provider Note   CSN: [id number] Arrival date & time: 03/09/2022  1231     History  Chief Complaint  Patient presents with   Altered Mental Status    Devin Ramsey is a 76 y.o. male.  Level 5 caveat due to altered mental status.  Per EMS which history is obtained from as family is unavailable by phone.  They were called out for concern for may be stroke.  Patient has been in bed for couple days not acting like himself.  Has a history of COPD, he is on blood thinners.  EMS states that family said that he was more unresponsive.  EMS found him hypotensive, cold unable to obtain a pulse ox.  He got 2 L of IV fluids in route.  He was mumbling words.  They put him on oxygen and bagged him.  They cannot provide much other info.  They are not sure if any recent falls or recent illness.  They state he is very thin and chronically ill-appearing.  The history is provided by the EMS personnel.       Home Medications Prior to Admission medications   Medication Sig Start Date End Date Taking? Authorizing Provider  apixaban (ELIQUIS) 5 MG TABS tablet Take 1 tablet by mouth twice daily 08/02/21   Antonieta Iba, MD  clobetasol cream (TEMOVATE) 0.05 % Apply 1 application. topically 2 (two) times daily as needed. 09/28/21   [name], MD  furosemide (LASIX) 20 MG tablet Take 1 tablet (20 mg total) by mouth daily as needed. 01/15/21   Antonieta Iba, MD  hydrOXYzine (ATARAX) 10 MG tablet TAKE 1/2 TO 1 (ONE-HALF TO ONE) TABLET BY MOUTH EVERY 8 HOURS AS NEEDED FOR ITCHING 12/19/21   [name], MD  levothyroxine (SYNTHROID) 100 MCG tablet Take 1 tablet (100 mcg total) by mouth daily. 10/07/21   [name], MD  predniSONE (DELTASONE) 10 MG tablet 2 tabs once daily with food if needed for gout. 10/04/21   [name], MD  rosuvastatin (CRESTOR) 10 MG tablet Take 1 tablet (10 mg total) by mouth daily. 01/15/21   Antonieta Iba, MD   sacubitril-valsartan (ENTRESTO) 24-26 MG Take 1 tablet by mouth 2 (two) times daily. 01/15/21   Antonieta Iba, MD  tiotropium (SPIRIVA HANDIHALER) 18 MCG inhalation capsule Place 1 capsule (18 mcg total) into inhaler and inhale daily. 09/02/19   [name], MD  traMADol (ULTRAM) 50 MG tablet TAKE 2 TABLETS BY MOUTH EVERY 12 HOURS AS NEEDED FOR  KNEE  PAIN 09/06/20   [name], MD  vitamin B-12 (CYANOCOBALAMIN) 1000 MCG tablet Take 1,000 mcg by mouth daily.    [provider]      Allergies    Codeine and Indocin [indomethacin]    Review of Systems   Review of Systems  Physical Exam Updated Vital Signs BP (!) 92/54   Pulse (!) 106   Temp (!) 91.8 F (33.2 C)   Resp 17   SpO2 91%  Physical Exam Vitals and nursing note reviewed.  Constitutional:      General: He is in acute distress.     Appearance: He is well-developed. He is ill-appearing.     Comments: Cachectic  HENT:     Head: Normocephalic and atraumatic.     Nose: Nose normal.     Mouth/Throat:     Mouth: Mucous membranes are dry.  Eyes:  Extraocular Movements: Extraocular movements intact.     Conjunctiva/sclera: Conjunctivae normal.     Pupils: Pupils are equal, round, and reactive to light.  Cardiovascular:     Rate and Rhythm: Regular rhythm. Tachycardia present.     Heart sounds: No murmur heard. Pulmonary:     Effort: Pulmonary effort is normal. No respiratory distress.     Breath sounds: Normal breath sounds.  Abdominal:     Palpations: Abdomen is soft.     Tenderness: There is no abdominal tenderness.  Musculoskeletal:        General: No swelling.     Cervical back: Neck supple.  Skin:    General: Skin is warm and dry.     Capillary Refill: Capillary refill takes less than 2 seconds.  Neurological:     GCS: GCS eye subscore is 4. GCS verbal subscore is 2. GCS motor subscore is 4.  Psychiatric:        Mood and Affect: Mood normal.     ED Results / Procedures / Treatments    Labs (all labs ordered are listed, but only abnormal results are displayed) Labs Reviewed  LACTIC ACID, PLASMA - Abnormal; Notable for the following components:      Result Value   Lactic Acid, Venous >9.0 (*)    All other components within normal limits  COMPREHENSIVE METABOLIC PANEL - Abnormal; Notable for the following components:   CO2 11 (*)    BUN 47 (*)    Creatinine, Ser 3.01 (*)    Calcium 6.9 (*)    Total Protein 3.7 (*)    Albumin 1.8 (*)    Total Bilirubin 1.9 (*)    GFR, Estimated 21 (*)    All other components within normal limits  PROTIME-INR - Abnormal; Notable for the following components:   Prothrombin Time 21.2 (*)    INR 1.9 (*)    All other components within normal limits  APTT - Abnormal; Notable for the following components:   aPTT 50 (*)    All other components within normal limits  URINALYSIS, ROUTINE W REFLEX MICROSCOPIC - Abnormal; Notable for the following components:   Color, Urine AMBER (*)    APPearance HAZY (*)    Hgb urine dipstick MODERATE (*)    Bilirubin Urine SMALL (*)    Protein, ur 30 (*)    Bacteria, UA RARE (*)    All other components within normal limits  RESP PANEL BY RT-PCR (FLU A&B, COVID) ARPGX2  CULTURE, BLOOD (ROUTINE X 2)  CULTURE, BLOOD (ROUTINE X 2)  URINE CULTURE  EXPECTORATED SPUTUM ASSESSMENT W GRAM STAIN, RFLX TO RESP C  CBC WITH DIFFERENTIAL/PLATELET  LACTIC ACID, PLASMA  LACTIC ACID, PLASMA  PROCALCITONIN  MAGNESIUM  CK  CBC WITH DIFFERENTIAL/PLATELET  PHOSPHORUS  LEGIONELLA PNEUMOPHILA SEROGP 1 UR AG  STREP PNEUMONIAE URINARY ANTIGEN  I-STAT CHEM 8, ED  I-STAT ARTERIAL BLOOD GAS, ED  TROPONIN I (HIGH SENSITIVITY)    EKG EKG Interpretation  Date/Time:  03/21/2022 12:34:17 EDT Ventricular Rate:  100 PR Interval:    QRS Duration: 148 QT Interval:  397 QTC Calculation: 513 R Axis:   81 Text Interpretation: Atrial fibrillation Nonspecific intraventricular conduction delay Anteroseptal  infarct, age indeterminate Lateral leads are also involved Confirmed by Lennice Sites [phone]) on 21-Mar-2022 1:26:14 PM  Radiology CT Head Wo Contrast  Addendum Date: March 21, 2022   ADDENDUM REPORT: 03-21-2022 14:11 ADDENDUM: These results were called by telephone at the time of interpretation on 03/21/2022  at 1:54 pm to provider Mackenzye Mackel , who verbally acknowledged these results. Electronically Signed   By: Kellie Simmering D.O.   On: 02-25-22 14:11   Result Date: Feb 25, 2022 CLINICAL DATA:  Provided history: Mental status change, unknown cause. EXAM: CT HEAD WITHOUT CONTRAST TECHNIQUE: Contiguous axial images were obtained from the base of the skull through the vertex without intravenous contrast. RADIATION DOSE REDUCTION: This exam was performed according to the departmental dose-optimization program which includes automated exposure control, adjustment of the mA and/or kV according to patient size and/or use of iterative reconstruction technique. COMPARISON:  No pertinent prior exams available for comparison. FINDINGS: Mildly motion degraded exam. Brain: Moderate generalized cerebral atrophy. Mild cerebellar atrophy. 3.9 cm cortical/subcortical infarct within the right occipital lobe (right PCA vascular territory). Small cortically based infarct within the right parietal lobe. 4.1 cm cortical/subcortical infarct within the left parietooccipital lobes (left PCA vascular territory). Motion degradation limits evaluation, but these infarcts are favored to be acute/subacute. No significant associated mass effect at this time. Chronic lacunar infarcts within the left corona radiata and bilateral basal ganglia. Background mild patchy and ill-defined hypoattenuation within the cerebral white matter, nonspecific but compatible with chronic small vessel disease. A small age-indeterminate infarct is questioned within the left cerebellar hemisphere (series 5, image 7). There is no acute intracranial hemorrhage. No  extra-axial fluid collection. No evidence of an intracranial mass. No midline shift. Vascular: No hyperdense vessel. Atherosclerotic calcifications. Skull: No fracture or aggressive osseous lesion. Sinuses/Orbits: No mass or acute finding within the imaged orbits. Mild mucosal thickening within the left sphenoid sinus. Opacification of an anterior right ethmoid air cell. Attempts are being made to reach the ordering provider at this time. IMPRESSION: 1. Motion degraded exam. 2. Bilateral cortical/subcortical PCA territory infarcts. Motion degradation limits evaluation, but these infarcts are favored to be acute/subacute. Consider a brain MRI for further evaluation. 3. A small age-indeterminate infarct is questioned within the left cerebellar hemisphere. This too could be further evaluated with MRI, as clinically warranted. 4. Background parenchymal atrophy and chronic small vessel ischemic disease, as described. 5. Mild paranasal sinus disease. Electronically Signed: By: Kellie Simmering D.O. On: 02-25-22 13:51   DG Chest Port 1 View  Result Date: February 25, 2022 CLINICAL DATA:  Questionable sepsis. Evaluate for abnormality. Altered mental status. EXAM: PORTABLE CHEST 1 VIEW COMPARISON:  Chest two views 07/01/2014, AP chest 06/29/2014 FINDINGS: Cardiac silhouette and mediastinal contours are within normal limits. Moderate calcification within the aortic arch. Bilateral mid and lower lung interstitial thickening appears similar to remote 07/01/2014 radiographs and may represent chronic scarring. There is improved aeration of the left costophrenic angle, which is now clear. There is some linear density superior to this, within the inferolateral left lung that is slightly smaller in higher density compared to linear densities in this region on 07/01/2014 frontal radiograph, new from 06/29/2014 frontal radiograph. No pleural effusion or pneumothorax. Moderate multilevel degenerative disc changes of the thoracic spine.  IMPRESSION: 1. Bilateral mid and lower lung interstitial thickening appears similar to remote 07/01/2014 radiographs and may represent chronic scarring. 2. Lateral left lower lung linear density may represent scarring or atelectasis. It is difficult to exclude mild early pneumonia, however pneumonia is not favored. Electronically Signed   By: Yvonne Kendall M.D.   On: February 25, 2022 13:06    Procedures .Critical Care  Performed by: Lennice Sites, DO Authorized by: Lennice Sites, DO   Critical care provider statement:    Critical care time (minutes):  28  Critical care was necessary to treat or prevent imminent or life-threatening deterioration of the following conditions:  Circulatory failure, sepsis and CNS failure or compromise   Critical care was time spent personally by me on the following activities:  Blood draw for specimens, development of treatment plan with patient or surrogate, discussions with consultants, discussions with primary provider, evaluation of patient's response to treatment, obtaining history from patient or surrogate, ordering and performing treatments and interventions, ordering and review of laboratory studies, ordering and review of radiographic studies, pulse oximetry, re-evaluation of patient's condition and review of old charts   Care discussed with: admitting provider       Medications Ordered in ED Medications  lactated ringers infusion ( Intravenous New Bag/Given 03/21/2022 1309)  vancomycin (VANCOREADY) IVPB 1500 mg/300 mL (1,500 mg Intravenous New Bag/Given 2022/03/21 1357)  ceFEPIme (MAXIPIME) 2 g in sodium chloride 0.9 % 100 mL IVPB (has no administration in time range)  vancomycin variable dose per unstable renal function (pharmacist dosing) (has no administration in time range)  docusate sodium (COLACE) capsule 100 mg (has no administration in time range)  polyethylene glycol (MIRALAX / GLYCOLAX) packet 17 g (has no administration in time range)  0.9 %  sodium  chloride infusion (has no administration in time range)  norepinephrine (LEVOPHED) 4mg  in 216mL (0.016 mg/mL) premix infusion (has no administration in time range)  lactated ringers bolus 1,000 mL (has no administration in time range)  lactated ringers bolus 1,000 mL (has no administration in time range)  lactated ringers bolus 1,000 mL (0 mLs Intravenous Stopped 03/21/2022 1335)  ceFEPIme (MAXIPIME) 2 g in sodium chloride 0.9 % 100 mL IVPB (0 g Intravenous Stopped 2022/03/21 1335)  sodium chloride 0.9 % bolus 2,000 mL (2,000 mLs Intravenous New Bag/Given 21-Mar-2022 1352)  sodium chloride 0.9 % bolus 2,000 mL (0 mLs Intravenous Stopped 03/21/22 1352)    ED Course/ Medical Decision Making/ A&P                           Medical Decision Making Amount and/or Complexity of Data Reviewed Labs: ordered. Radiology: ordered. ECG/medicine tests: ordered.  Risk Prescription drug management. Decision regarding hospitalization.   KYRI DAI is here with altered mental status.  Patient hypotensive in the 60s, hypothermic to 91.1, increased work of breathing.  Unable to obtain pulse ox as patient is too cold.  EKG per my review and interpretation shows atrial fibrillation.  Unable to obtain history from the patient or from family.  Family not picking up the phone.  Patient per EMS altered for the last several days.  Hypotensive in route.  He got 2 L of IV fluid with EMS.  Heart rate in the 110s.  Patient mumbles, opens eyes spontaneously, will withdraw to pain.  Appears have a GCS of 10.  He is not really moving his right upper arm or right lower leg compared to the left.  Pupils are equal and minimally reactive.  Per chart review he has a history of atrial fibrillation on Eliquis, COPD history, high cholesterol.  Per EMS they did talk with family and he is full code.  Patient is cachectic appearing.  Differential diagnosis is likely sepsis suspect pulmonary or urinary source.  Seems less likely to be stroke but  could be as well as may be head bleed.  Sepsis order set initiated including blood cultures, IV antibiotics.  We will give an additional 2 L IV fluids and start patient  on Levophed given he still hypotensive after 2 L of fluid.  Blood pressure responding to Levophed.  92/54.  Continues to be hypothermic.  Lactic is greater than 9 per my review interpretation the labs.  Creatinine is 3.  Urinalysis does not appear to be infected.  Chest x-ray with possible pneumonia.  CT scan ahead per radiology over the phone concern for PCA infarcts bilaterally.  These could be acute/subacute.  Neurology, Dr. Malen Gauze is aware and will get MRI to further evaluate when patient is stable.  Patient admitted to the ICU team for further care.  Still full code, still unable to get in touch with family.        Final Clinical Impression(s) / ED Diagnoses Final diagnoses:  Septic shock ([place])  Cerebrovascular accident (CVA), unspecified mechanism ([place])  Encephalopathy    Rx / DC Orders ED Discharge Orders     None         Lennice Sites, DO 02/22/22 1422

## 2022-03-20 DEATH — deceased
